# Patient Record
Sex: Female | Born: 1970
Health system: Southern US, Community
[De-identification: ages and names within clinical notes are randomized; demographics above are authoritative.]

## PROBLEM LIST (undated history)

## (undated) DIAGNOSIS — R7303 Prediabetes: Secondary | ICD-10-CM

## (undated) DIAGNOSIS — L0390 Cellulitis, unspecified: Secondary | ICD-10-CM

## (undated) DIAGNOSIS — E7800 Pure hypercholesterolemia, unspecified: Secondary | ICD-10-CM

## (undated) DIAGNOSIS — E119 Type 2 diabetes mellitus without complications: Secondary | ICD-10-CM

## (undated) DIAGNOSIS — L309 Dermatitis, unspecified: Secondary | ICD-10-CM

## (undated) DIAGNOSIS — E559 Vitamin D deficiency, unspecified: Secondary | ICD-10-CM

## (undated) DIAGNOSIS — I10 Essential (primary) hypertension: Secondary | ICD-10-CM

## (undated) DIAGNOSIS — K8020 Calculus of gallbladder without cholecystitis without obstruction: Secondary | ICD-10-CM

## (undated) DIAGNOSIS — E669 Obesity, unspecified: Secondary | ICD-10-CM

## (undated) HISTORY — PX: BREAST SURGERY: SHX581

## (undated) HISTORY — PX: TUBAL LIGATION: SHX77

---

## 2001-05-01 ENCOUNTER — Emergency Department (HOSPITAL_COMMUNITY): Admission: EM | Admit: 2001-05-01 | Discharge: 2001-05-01 | Payer: Self-pay | Admitting: Internal Medicine

## 2001-05-02 ENCOUNTER — Ambulatory Visit (HOSPITAL_COMMUNITY): Admission: RE | Admit: 2001-05-02 | Discharge: 2001-05-02 | Payer: Self-pay | Admitting: Internal Medicine

## 2001-05-02 ENCOUNTER — Encounter: Payer: Self-pay | Admitting: Internal Medicine

## 2003-08-11 ENCOUNTER — Emergency Department (HOSPITAL_COMMUNITY): Admission: EM | Admit: 2003-08-11 | Discharge: 2003-08-11 | Payer: Self-pay | Admitting: Emergency Medicine

## 2005-10-24 ENCOUNTER — Emergency Department (HOSPITAL_COMMUNITY): Admission: EM | Admit: 2005-10-24 | Discharge: 2005-10-24 | Payer: Self-pay | Admitting: Emergency Medicine

## 2006-09-25 ENCOUNTER — Emergency Department (HOSPITAL_COMMUNITY): Admission: EM | Admit: 2006-09-25 | Discharge: 2006-09-25 | Payer: Self-pay | Admitting: Emergency Medicine

## 2007-06-26 ENCOUNTER — Emergency Department (HOSPITAL_COMMUNITY): Admission: EM | Admit: 2007-06-26 | Discharge: 2007-06-26 | Payer: Self-pay | Admitting: Emergency Medicine

## 2009-04-08 ENCOUNTER — Ambulatory Visit (HOSPITAL_COMMUNITY): Admission: RE | Admit: 2009-04-08 | Discharge: 2009-04-08 | Payer: Self-pay | Admitting: Preventative Medicine

## 2009-04-08 ENCOUNTER — Encounter: Payer: Self-pay | Admitting: Orthopedic Surgery

## 2009-04-25 ENCOUNTER — Ambulatory Visit: Payer: Self-pay | Admitting: Orthopedic Surgery

## 2009-04-25 DIAGNOSIS — M1710 Unilateral primary osteoarthritis, unspecified knee: Secondary | ICD-10-CM | POA: Insufficient documentation

## 2009-04-25 DIAGNOSIS — M1711 Unilateral primary osteoarthritis, right knee: Secondary | ICD-10-CM | POA: Insufficient documentation

## 2009-12-16 ENCOUNTER — Emergency Department (HOSPITAL_COMMUNITY): Admission: EM | Admit: 2009-12-16 | Discharge: 2009-12-16 | Payer: Self-pay | Admitting: Emergency Medicine

## 2010-09-02 ENCOUNTER — Other Ambulatory Visit (HOSPITAL_COMMUNITY): Payer: Self-pay | Admitting: Internal Medicine

## 2010-09-02 DIAGNOSIS — Z139 Encounter for screening, unspecified: Secondary | ICD-10-CM

## 2010-09-07 LAB — URINALYSIS, ROUTINE W REFLEX MICROSCOPIC
Ketones, ur: NEGATIVE mg/dL
Nitrite: NEGATIVE
Protein, ur: NEGATIVE mg/dL
Urobilinogen, UA: 0.2 mg/dL (ref 0.0–1.0)
pH: 6 (ref 5.0–8.0)

## 2010-09-07 LAB — URINE CULTURE: Colony Count: 35000

## 2010-09-09 ENCOUNTER — Ambulatory Visit (HOSPITAL_COMMUNITY)
Admission: RE | Admit: 2010-09-09 | Discharge: 2010-09-09 | Disposition: A | Discharge status: Home / Self Care | Payer: BC Managed Care – PPO | Source: Ambulatory Visit | Attending: Internal Medicine | Admitting: Internal Medicine

## 2010-09-09 DIAGNOSIS — Z139 Encounter for screening, unspecified: Secondary | ICD-10-CM

## 2010-09-09 DIAGNOSIS — Z1231 Encounter for screening mammogram for malignant neoplasm of breast: Secondary | ICD-10-CM | POA: Insufficient documentation

## 2011-11-09 ENCOUNTER — Emergency Department (HOSPITAL_COMMUNITY): Payer: BC Managed Care – PPO

## 2011-11-09 ENCOUNTER — Encounter (HOSPITAL_COMMUNITY): Payer: Self-pay | Admitting: *Deleted

## 2011-11-09 ENCOUNTER — Inpatient Hospital Stay (HOSPITAL_COMMUNITY)
Admission: EM | Admit: 2011-11-09 | Discharge: 2011-11-12 | DRG: 277 | Disposition: A | Discharge status: Home / Self Care | Payer: BC Managed Care – PPO | Attending: Internal Medicine | Admitting: Internal Medicine

## 2011-11-09 DIAGNOSIS — E669 Obesity, unspecified: Secondary | ICD-10-CM | POA: Diagnosis present

## 2011-11-09 DIAGNOSIS — L03039 Cellulitis of unspecified toe: Secondary | ICD-10-CM

## 2011-11-09 DIAGNOSIS — L02619 Cutaneous abscess of unspecified foot: Secondary | ICD-10-CM

## 2011-11-09 DIAGNOSIS — R509 Fever, unspecified: Secondary | ICD-10-CM | POA: Diagnosis present

## 2011-11-09 DIAGNOSIS — Z6841 Body Mass Index (BMI) 40.0 and over, adult: Secondary | ICD-10-CM

## 2011-11-09 DIAGNOSIS — R21 Rash and other nonspecific skin eruption: Secondary | ICD-10-CM

## 2011-11-09 DIAGNOSIS — D72829 Elevated white blood cell count, unspecified: Secondary | ICD-10-CM | POA: Diagnosis present

## 2011-11-09 DIAGNOSIS — E876 Hypokalemia: Secondary | ICD-10-CM | POA: Diagnosis present

## 2011-11-09 DIAGNOSIS — L03119 Cellulitis of unspecified part of limb: Secondary | ICD-10-CM

## 2011-11-09 DIAGNOSIS — L02419 Cutaneous abscess of limb, unspecified: Principal | ICD-10-CM | POA: Diagnosis present

## 2011-11-09 LAB — CBC
HCT: 39.3 % (ref 36.0–46.0)
Hemoglobin: 13 g/dL (ref 12.0–15.0)
MCHC: 33.1 g/dL (ref 30.0–36.0)
RBC: 4.65 MIL/uL (ref 3.87–5.11)

## 2011-11-09 LAB — BASIC METABOLIC PANEL
BUN: 9 mg/dL (ref 6–23)
Chloride: 101 mEq/L (ref 96–112)
GFR calc Af Amer: 90 mL/min — ABNORMAL LOW (ref >90)
Glucose, Bld: 123 mg/dL — ABNORMAL HIGH (ref 70–99)
Potassium: 3.2 mEq/L — ABNORMAL LOW (ref 3.5–5.1)

## 2011-11-09 LAB — URINE MICROSCOPIC-ADD ON

## 2011-11-09 LAB — URINALYSIS, ROUTINE W REFLEX MICROSCOPIC
Leukocytes, UA: NEGATIVE
Protein, ur: 30 mg/dL — AB
Urobilinogen, UA: 0.2 mg/dL (ref 0.0–1.0)

## 2011-11-09 LAB — DIFFERENTIAL
Basophils Relative: 0 % (ref 0–1)
Lymphs Abs: 1.1 10*3/uL (ref 0.7–4.0)
Monocytes Absolute: 1 10*3/uL (ref 0.1–1.0)
Monocytes Relative: 4 % (ref 3–12)
Neutro Abs: 22.9 10*3/uL — ABNORMAL HIGH (ref 1.7–7.7)

## 2011-11-09 LAB — PROCALCITONIN: Procalcitonin: 1.9 ng/mL

## 2011-11-09 MED ORDER — SODIUM CHLORIDE 0.9 % IV SOLN
INTRAVENOUS | Status: DC
  Administered 2011-11-09 – 2011-11-11 (×5): via INTRAVENOUS

## 2011-11-09 MED ORDER — SODIUM CHLORIDE 0.9 % IV SOLN
1250.0000 mg | Freq: Two times a day (BID) | INTRAVENOUS | Status: DC
  Administered 2011-11-10 – 2011-11-11 (×2): 1250 mg via INTRAVENOUS
  Filled 2011-11-09 (×3): qty 1250

## 2011-11-09 MED ORDER — HYDROMORPHONE HCL PF 1 MG/ML IJ SOLN
1.0000 mg | Freq: Once | INTRAMUSCULAR | Status: AC
  Administered 2011-11-09: 1 mg via INTRAVENOUS
  Filled 2011-11-09: qty 1

## 2011-11-09 MED ORDER — ACETAMINOPHEN 325 MG PO TABS: 650.0000 mg | ORAL_TABLET | Freq: Four times a day (QID) | ORAL | Status: DC | PRN

## 2011-11-09 MED ORDER — OXYCODONE HCL 5 MG PO TABS
5.0000 mg | ORAL_TABLET | ORAL | Status: DC | PRN
  Administered 2011-11-10 – 2011-11-11 (×6): 5 mg via ORAL
  Filled 2011-11-09 (×7): qty 1

## 2011-11-09 MED ORDER — VANCOMYCIN HCL IN DEXTROSE 1-5 GM/200ML-% IV SOLN: 1000.0000 mg | Freq: Once | INTRAVENOUS | Status: DC

## 2011-11-09 MED ORDER — ONDANSETRON HCL 4 MG/2ML IJ SOLN
4.0000 mg | Freq: Four times a day (QID) | INTRAMUSCULAR | Status: DC | PRN
  Administered 2011-11-12: 4 mg via INTRAVENOUS
  Filled 2011-11-09: qty 2

## 2011-11-09 MED ORDER — PIPERACILLIN-TAZOBACTAM 3.375 G IVPB
3.3750 g | Freq: Three times a day (TID) | INTRAVENOUS | Status: DC
  Administered 2011-11-09 – 2011-11-12 (×8): 3.375 g via INTRAVENOUS
  Filled 2011-11-09 (×12): qty 50

## 2011-11-09 MED ORDER — ONDANSETRON HCL 4 MG PO TABS: 4.0000 mg | ORAL_TABLET | Freq: Four times a day (QID) | ORAL | Status: DC | PRN

## 2011-11-09 MED ORDER — ACETAMINOPHEN 650 MG RE SUPP
975.0000 mg | Freq: Once | RECTAL | Status: AC
  Administered 2011-11-09: 975 mg via RECTAL
  Filled 2011-11-09: qty 1

## 2011-11-09 MED ORDER — ONDANSETRON HCL 4 MG/2ML IJ SOLN
4.0000 mg | Freq: Once | INTRAMUSCULAR | Status: AC
  Administered 2011-11-09: 4 mg via INTRAVENOUS
  Filled 2011-11-09: qty 2

## 2011-11-09 MED ORDER — HEPARIN SODIUM (PORCINE) 5000 UNIT/ML IJ SOLN
5000.0000 [IU] | Freq: Three times a day (TID) | INTRAMUSCULAR | Status: DC
  Administered 2011-11-09 – 2011-11-12 (×8): 5000 [IU] via SUBCUTANEOUS
  Filled 2011-11-09 (×14): qty 1

## 2011-11-09 MED ORDER — PIPERACILLIN-TAZOBACTAM 3.375 G IVPB
INTRAVENOUS | Status: AC
  Filled 2011-11-09: qty 100

## 2011-11-09 MED ORDER — ACETAMINOPHEN 500 MG PO TABS
1000.0000 mg | ORAL_TABLET | Freq: Once | ORAL | Status: DC
  Filled 2011-11-09: qty 2

## 2011-11-09 MED ORDER — VANCOMYCIN HCL IN DEXTROSE 1-5 GM/200ML-% IV SOLN
1000.0000 mg | Freq: Once | INTRAVENOUS | Status: AC
  Administered 2011-11-09: 1000 mg via INTRAVENOUS
  Filled 2011-11-09: qty 200

## 2011-11-09 MED ORDER — SODIUM CHLORIDE 0.9 % IV BOLUS (SEPSIS)
500.0000 mL | Freq: Once | INTRAVENOUS | Status: AC
  Administered 2011-11-09: 500 mL via INTRAVENOUS

## 2011-11-09 MED ORDER — ACETAMINOPHEN 650 MG RE SUPP
650.0000 mg | Freq: Four times a day (QID) | RECTAL | Status: DC | PRN
  Administered 2011-11-09 – 2011-11-10 (×3): 650 mg via RECTAL
  Filled 2011-11-09 (×3): qty 1

## 2011-11-09 MED ORDER — PIPERACILLIN-TAZOBACTAM 3.375 G IVPB 30 MIN
3.3750 g | Freq: Once | INTRAVENOUS | Status: AC
  Administered 2011-11-09: 3.375 g via INTRAVENOUS
  Filled 2011-11-09: qty 50

## 2011-11-09 NOTE — Progress Notes (Signed)
ANTIBIOTIC CONSULT NOTE - INITIAL  Pharmacy Consult for Vancomycin, Zosyn Indication: cellulits  No Known Allergies  Patient Measurements: Height: 5' 9.5" (176.5 cm) Weight: 300 lb (136.079 kg) IBW/kg (Calculated) : 67.35   Vital Signs: Temp: 103.2 F (39.6 C) (05/20 1637) Temp src: Oral (05/20 1637) BP: 110/65 mmHg (05/20 1458) Pulse Rate: 115  (05/20 1637) Intake/Output from previous day:   Intake/Output from this shift:    Labs:  Basename 11/09/11 1514  WBC 25.0*  HGB 13.0  PLT 327  LABCREA --  CREATININE 0.91   Estimated Creatinine Clearance: 121.9 ml/min (by C-G formula based on Cr of 0.91). No results found for this basename: VANCOTROUGH:2,VANCOPEAK:2,VANCORANDOM:2,GENTTROUGH:2,GENTPEAK:2,GENTRANDOM:2,TOBRATROUGH:2,TOBRAPEAK:2,TOBRARND:2,AMIKACINPEAK:2,AMIKACINTROU:2,AMIKACIN:2, in the last 72 hours   Microbiology: No results found for this or any previous visit (from the past 720 hour(s)).  Medical History: History reviewed. No pertinent past medical history.  Medications:  Scheduled:    . acetaminophen  975 mg Rectal Once  . acetaminophen  1,000 mg Oral Once  . heparin  5,000 Units Subcutaneous Q8H  .  HYDROmorphone (DILAUDID) injection  1 mg Intravenous Once  . ondansetron (ZOFRAN) IV  4 mg Intravenous Once  . piperacillin-tazobactam  3.375 g Intravenous Once  . sodium chloride  500 mL Intravenous Once  . vancomycin  1,250 mg Intravenous Q12H   Assessment: Pt admitted with RLL cellulitis to start on IV antibiotics. She is febrile with elevated WBC.  Normal renal function.  She has received 1gm Vancomycin and Zosyn 3.375gm in ED.   Goal of Therapy:  Vancomycin trough level 10-15 mcg/ml  Plan:  1) Given additional Vancomycin 1gm IV bolus x 1 now for total 2gm load dose then 1250mg  IV Q12h 2) Zosyn 3.375gm IV Q8h 3) Check vancomycin trough level at steady state 4) Monitor renal function and cx data  Elson Clan 11/09/2011,5:24  PM

## 2011-11-09 NOTE — H&P (Signed)
Carrie Mitchell MRN: [id number] DOB/AGE: 42-Aug-1972 42 y.o.  Admit date: 11/09/2011 Chief Complaint: Right leg pain, fever. HPI: This 41 year old lady presents with the above symptoms for the last few days. She cannot specify exactly when it started but in the emergency room she appears to be very uncomfortable. She was documented to have a fever. She was also documented to have cellulitis in the right lower leg. Venous Doppler did not reveal DVT. The source of entry is probably is the right foot where there is dry skin with cracking. She is not otherwise hemodynamically unstable. She has a significant leukocytosis.  History reviewed. No pertinent past medical history. Past Surgical History  Procedure Date  . Tubal ligation           Social History:  She is married and lives with her husband and 4 children. She does not smoke and does not drink alcohol. 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  Allergies: No Known Allergies       WUJ:WJXBJ from the symptoms mentioned above,there are no other symptoms referable to all systems reviewed.  Physical Exam: Blood pressure 110/65, pulse 115, temperature 103.2 F (39.6 C), temperature source Oral, resp. rate 22, height 5' 9.5" (1.765 m), weight 136.079 kg (300 lb), SpO2 99.00%. She is uncomfortable at rest. She is febrile. She does not however looked to be septic/toxic. Heart sounds are present and normal with a resting tachycardia. Lung fields are clear. She is alert and orientated. Her abdomen is soft and nontender. There is cellulitis in her right leg extending from the mid lower leg down towards the ankle. It does not involve the foot.    Basename 11/09/11 1514  WBC 25.0*  NEUTROABS 22.9*  HGB 13.0  HCT 39.3  MCV 84.5  PLT 327    Basename 11/09/11 1514  NA 136  K 3.2*  CL 101  CO2 24  GLUCOSE 123*  BUN 9  CREATININE 0.91  CALCIUM 9.6  MG --         US Venous Img Lower Unilateral Right  11/09/2011   *RADIOLOGY REPORT*  Clinical Data: Pain and swelling.  RIGHT LOWER EXTREMITY VENOUS DUPLEX ULTRASOUND  Technique:  Gray-scale sonography with graded compression, as well as color Doppler and duplex ultrasound, were performed to evaluate the deep venous system of the lower extremity from the level of the common femoral vein through the popliteal and proximal calf veins. Spectral Doppler was utilized to evaluate flow at rest and with distal augmentation maneuvers.  Comparison:  None.  Findings: There is complete compressibility of the right common femoral, femoral, popliteal veins.  Doppler analysis demonstrates respiratory phasicity and augmentation of flow upon calf compression.  IMPRESSION: No evidence of right lower extremity DVT.  Original Report Authenticated By: Donavan Burnet, M.D.   Impression: 1. Right lower leg cellulitis. 2. Hypokalemia. 3. Obesity.     Plan: 1. Admit to the medical floor. 2. Intravenous antibiotics.  3. Analgesia as required. 4. IV fluids. Further recommendations will depend on patient's hospital progress.      Wilson Singer Pager [phone]  11/09/2011, 5:00 PM

## 2011-11-09 NOTE — ED Provider Notes (Signed)
History   This chart was scribed for Flint Melter, MD by Clarita Crane. The patient was seen in room APA15/APA15. Patient's care was started at 1211.    CSN: [id number]  Arrival date & time 11/09/11  1211   First MD Initiated Contact with Patient 11/09/11 1456      Chief Complaint  Patient presents with  . Back Pain    (Consider location/radiation/quality/duration/timing/severity/associated sxs/prior treatment) HPI Carrie Mitchell is a 41 y.o. female who presents to the Emergency Department accompanied by family member who reports patient c/o constant severe pain to bilateral upper and lower extremities which began 12 hours ago and has been persistent since with associated fever, chills, back pain, HA and numbness to bilateral upper extremities. Patient's family member also states that she noted that the right side of the patient's face appeared swollen this morning and that she also observed a red "band" around the distal aspect of her right lower leg which was not observed yesterday. Denies SOB, chest pain, nausea, vomiting. Patient with h/o tubal ligation. LMP- 1 year ago.  History reviewed. No pertinent past medical history.  Past Surgical History  Procedure Date  . Tubal ligation     History reviewed. No pertinent family history.  History  Substance Use Topics  . Smoking status: Never Smoker   . Smokeless tobacco: Not on file  . Alcohol Use: No    OB History    Grav Para Term Preterm Abortions TAB SAB Ect Mult Living                  Review of Systems A complete 10 system review of systems was obtained and all systems are negative except as noted in the HPI and PMH.   Allergies  Review of patient's allergies indicates no known allergies.  Home Medications   Current Outpatient Rx  Name Route Sig Dispense Refill  . ASPIRIN-CAFFEINE 845-65 MG PO PACK Oral Take 1 Package by mouth every 8 (eight) hours as needed.      BP 110/65  Pulse 115  Temp(Src) 103.2  F (39.6 C) (Oral)  Resp 22  Ht 5' 9.5" (1.765 m)  Wt 300 lb (136.079 kg)  BMI 43.67 kg/m2  SpO2 99%  Physical Exam  Nursing note and vitals reviewed. Constitutional: She is oriented to person, place, and time. She appears well-developed and well-nourished. No distress.       Uncomfortable appearing.   HENT:  Head: Normocephalic and atraumatic.       Mucous membranes moist.   Eyes: EOM are normal. Pupils are equal, round, and reactive to light.  Neck: Neck supple. No tracheal deviation present.  Cardiovascular: Regular rhythm.  Tachycardia present.  Exam reveals no gallop and no friction rub.   No murmur heard. Pulmonary/Chest: Effort normal. No respiratory distress. She has no wheezes. She has no rales.  Abdominal: Soft. Bowel sounds are normal. She exhibits no distension.  Musculoskeletal: Normal range of motion. She exhibits no edema.       Mild diffuse tenderness to back. Thickened skin and hyperpigmentation to bilateral feet. Right calf swollen and tender to palpation. Swelling to anterior right lower leg with an area of redness. Increased induration to posterior aspect of right ankle.   Neurological: She is alert and oriented to person, place, and time. No sensory deficit.  Skin: Skin is warm and dry.  Psychiatric: She has a normal mood and affect. Her behavior is normal.    ED Course  Procedures (including  critical care time)  DIAGNOSTIC STUDIES: Oxygen Saturation is 98% on room air, normal by my interpretation.    COORDINATION OF CARE: 3:15PM-Patient informed of current plan for treatment and evaluation and agrees with plan at this time.   Emergency department treatment: IV fluid bolus and drip, Zosyn, IV, vancomycin, IV, reevaluation and assessment..  16:27-  repeat vitals ordered.    Labs Reviewed  URINALYSIS, ROUTINE W REFLEX MICROSCOPIC - Abnormal; Notable for the following:    pH 8.5 (*)    Protein, ur 30 (*)    All other components within normal limits    BASIC METABOLIC PANEL - Abnormal; Notable for the following:    Potassium 3.2 (*)    Glucose, Bld 123 (*)    GFR calc non Af Amer 77 (*)    GFR calc Af Amer 90 (*)    All other components within normal limits  CBC - Abnormal; Notable for the following:    WBC 25.0 (*)    All other components within normal limits  DIFFERENTIAL - Abnormal; Notable for the following:    Neutrophils Relative 92 (*)    Neutro Abs 22.9 (*)    Lymphocytes Relative 4 (*)    All other components within normal limits  URINE MICROSCOPIC-ADD ON - Abnormal; Notable for the following:    Squamous Epithelial / LPF MANY (*)    Bacteria, UA FEW (*)    Casts HYALINE CASTS (*)    All other components within normal limits  LACTIC ACID, PLASMA  PROCALCITONIN  URINE CULTURE  CULTURE, BLOOD (ROUTINE X 2)  CULTURE, BLOOD (ROUTINE X 2)   US Venous Img Lower Unilateral Right  11/09/2011  *RADIOLOGY REPORT*  Clinical Data: Pain and swelling.  RIGHT LOWER EXTREMITY VENOUS DUPLEX ULTRASOUND  Technique:  Gray-scale sonography with graded compression, as well as color Doppler and duplex ultrasound, were performed to evaluate the deep venous system of the lower extremity from the level of the common femoral vein through the popliteal and proximal calf veins. Spectral Doppler was utilized to evaluate flow at rest and with distal augmentation maneuvers.  Comparison:  None.  Findings: There is complete compressibility of the right common femoral, femoral, popliteal veins.  Doppler analysis demonstrates respiratory phasicity and augmentation of flow upon calf compression.  IMPRESSION: No evidence of right lower extremity DVT.  Original Report Authenticated By: Donavan Burnet, M.D.     1. Cellulitis of lower leg   2. Rash       MDM  Evaluation consistent with right lower leg cellulitis, likely caused from foot rash. Doubt severe sepsis, metabolic instability or impending vascular collapse. She will need treatment in hospital for  stabilization.    16:48- Admission arranged    I personally performed the services described in this documentation, which was scribed in my presence. The recorded information has been reviewed and considered.    Flint Melter, MD 11/09/11 [phone]

## 2011-11-09 NOTE — Progress Notes (Signed)
Dr. Karilyn Cota returned page at [facility] regarding changing Tylenol from PO to rectal. Order received. Will place in chart.

## 2011-11-09 NOTE — ED Notes (Signed)
Pt refused PO tylenol, states she cannot swallow pills.  Offered tylenol PR, pt accepted.

## 2011-11-09 NOTE — ED Notes (Signed)
Attempted to give report. Nurse unable to take report at this time. 

## 2011-11-09 NOTE — Progress Notes (Signed)
Pt's temperature 102.9 orally at this time. Pt states she can not swallow pills. Tylenol 650 mg PO ordered at this time. Dr. Karilyn Cota paged at [phone] regarding changing Tylenol from PO to Rectal. Will continue to monitor.

## 2011-11-09 NOTE — ED Notes (Signed)
Pharm called regarding heparin. States pt can wait until she gets on the floor

## 2011-11-09 NOTE — ED Notes (Signed)
Low back pain since 3 am,  Nausea, feels "cold".  No vomiting or diarrhea. Incontinence of urine this am  "I can't hold it".  Moaning at triage.

## 2011-11-10 DIAGNOSIS — L02619 Cutaneous abscess of unspecified foot: Secondary | ICD-10-CM

## 2011-11-10 DIAGNOSIS — L03039 Cellulitis of unspecified toe: Secondary | ICD-10-CM

## 2011-11-10 LAB — COMPREHENSIVE METABOLIC PANEL
ALT: 15 U/L (ref 0–35)
AST: 23 U/L (ref 0–37)
Alkaline Phosphatase: 85 U/L (ref 39–117)
CO2: 23 mEq/L (ref 19–32)
Chloride: 101 mEq/L (ref 96–112)
Creatinine, Ser: 1.33 mg/dL — ABNORMAL HIGH (ref 0.50–1.10)
GFR calc non Af Amer: 49 mL/min — ABNORMAL LOW (ref >90)
Potassium: 3.3 mEq/L — ABNORMAL LOW (ref 3.5–5.1)
Total Bilirubin: 0.8 mg/dL (ref 0.3–1.2)

## 2011-11-10 LAB — CBC
MCH: 27.6 pg (ref 26.0–34.0)
MCHC: 32.6 g/dL (ref 30.0–36.0)
MCV: 84.7 fL (ref 78.0–100.0)
Platelets: 279 10*3/uL (ref 150–400)
RDW: 15.3 % (ref 11.5–15.5)

## 2011-11-10 LAB — URINE CULTURE

## 2011-11-10 MED ORDER — POTASSIUM CHLORIDE CRYS ER 20 MEQ PO TBCR
40.0000 meq | EXTENDED_RELEASE_TABLET | ORAL | Status: AC
  Administered 2011-11-10 (×2): 40 meq via ORAL
  Filled 2011-11-10 (×2): qty 2

## 2011-11-10 NOTE — Progress Notes (Signed)
Subjective: She continues to have swelling, erythema, pain in her right lower extremity. Has great difficulty walking on it.  Objective: Vital signs in last 24 hours: Temp:  [99.2 F (37.3 C)-103.9 F (39.9 C)] 100.6 F (38.1 C) (05/21 1358) Pulse Rate:  [90-115] 97  (05/21 1358) Resp:  [18-22] 20  (05/21 1358) BP: (90-126)/(44-78) 108/69 mmHg (05/21 1358) SpO2:  [95 %-99 %] 95 % (05/21 1358) Weight:  [143.8 kg (317 lb 0.3 oz)] 143.8 kg (317 lb 0.3 oz) (05/20 1841) Weight change:  Last BM Date: 11/07/11  Intake/Output from previous day: 05/20 0701 - 05/21 0700 In: 1972.9 [I.V.:1672.9; IV Piggyback:300] Out: -  Total I/O In: 120 [P.O.:120] Out: -    Physical Exam: General: Alert, awake, oriented x3, in no acute distress. HEENT: No bruits, no goiter. Heart: Regular rate and rhythm, without murmurs, rubs, gallops. Lungs: Clear to auscultation bilaterally. Abdomen: Soft, nontender, nondistended, positive bowel sounds. Extremities: Right lower extremity is erythematous, warm, tender. Neuro: Grossly intact, nonfocal.    Lab Results: Basic Metabolic Panel:  Basename 11/10/11 0506 11/09/11 1514  NA 136 136  K 3.3* 3.2*  CL 101 101  CO2 23 24  GLUCOSE 110* 123*  BUN 13 9  CREATININE 1.33* 0.91  CALCIUM 8.5 9.6  MG -- --  PHOS -- --   Liver Function Tests:  Basename 11/10/11 0506  AST 23  ALT 15  ALKPHOS 85  BILITOT 0.8  PROT 6.9  ALBUMIN 2.8*   No results found for this basename: LIPASE:2,AMYLASE:2 in the last 72 hours No results found for this basename: AMMONIA:2 in the last 72 hours CBC:  Basename 11/10/11 0506 11/09/11 1514  WBC 17.1* 25.0*  NEUTROABS -- 22.9*  HGB 11.9* 13.0  HCT 36.5 39.3  MCV 84.7 84.5  PLT 279 327   Cardiac Enzymes: No results found for this basename: CKTOTAL:3,CKMB:3,CKMBINDEX:3,TROPONINI:3 in the last 72 hours BNP: No results found for this basename: PROBNP:3 in the last 72 hours D-Dimer: No results found for this  basename: DDIMER:2 in the last 72 hours CBG: No results found for this basename: GLUCAP:6 in the last 72 hours Hemoglobin A1C: No results found for this basename: HGBA1C in the last 72 hours Fasting Lipid Panel: No results found for this basename: CHOL,HDL,LDLCALC,TRIG,CHOLHDL,LDLDIRECT in the last 72 hours Thyroid Function Tests: No results found for this basename: TSH,T4TOTAL,FREET4,T3FREE,THYROIDAB in the last 72 hours Anemia Panel: No results found for this basename: VITAMINB12,FOLATE,FERRITIN,TIBC,IRON,RETICCTPCT in the last 72 hours Coagulation: No results found for this basename: LABPROT:2,INR:2 in the last 72 hours Urine Drug Screen: Drugs of Abuse  No results found for this basename: labopia, cocainscrnur, labbenz, amphetmu, thcu, labbarb    Alcohol Level: No results found for this basename: ETH:2 in the last 72 hours Urinalysis:  Basename 11/09/11 1506  COLORURINE YELLOW  LABSPEC 1.015  PHURINE 8.5*  GLUCOSEU NEGATIVE  HGBUR NEGATIVE  BILIRUBINUR NEGATIVE  KETONESUR NEGATIVE  PROTEINUR 30*  UROBILINOGEN 0.2  NITRITE NEGATIVE  LEUKOCYTESUR NEGATIVE    Recent Results (from the past 240 hour(s))  CULTURE, BLOOD (ROUTINE X 2)     Status: Normal (Preliminary result)   Collection Time   11/09/11  3:30 PM      Component Value Range Status Comment   Specimen Description BLOOD RIGHT FOREARM   Final    Special Requests BOTTLES DRAWN AEROBIC AND ANAEROBIC 5CC   Final    Culture NO GROWTH 1 DAY   Final    Report Status PENDING   Incomplete  CULTURE, BLOOD (ROUTINE X 2)     Status: Normal (Preliminary result)   Collection Time   11/09/11  3:35 PM      Component Value Range Status Comment   Specimen Description BLOOD LEFT FOREARM   Final    Special Requests BOTTLES DRAWN AEROBIC AND ANAEROBIC 4CC   Final    Culture NO GROWTH 1 DAY   Final    Report Status PENDING   Incomplete     Studies/Results: US Venous Img Lower Unilateral Right  11/09/2011  *RADIOLOGY REPORT*   Clinical Data: Pain and swelling.  RIGHT LOWER EXTREMITY VENOUS DUPLEX ULTRASOUND  Technique:  Gray-scale sonography with graded compression, as well as color Doppler and duplex ultrasound, were performed to evaluate the deep venous system of the lower extremity from the level of the common femoral vein through the popliteal and proximal calf veins. Spectral Doppler was utilized to evaluate flow at rest and with distal augmentation maneuvers.  Comparison:  None.  Findings: There is complete compressibility of the right common femoral, femoral, popliteal veins.  Doppler analysis demonstrates respiratory phasicity and augmentation of flow upon calf compression.  IMPRESSION: No evidence of right lower extremity DVT.  Original Report Authenticated By: Donavan Burnet, M.D.    Medications: Scheduled Meds:   . acetaminophen  975 mg Rectal Once  . acetaminophen  1,000 mg Oral Once  . heparin  5,000 Units Subcutaneous Q8H  .  HYDROmorphone (DILAUDID) injection  1 mg Intravenous Once  . ondansetron (ZOFRAN) IV  4 mg Intravenous Once  . piperacillin-tazobactam  3.375 g Intravenous Once  . piperacillin-tazobactam (ZOSYN)  IV  3.375 g Intravenous Q8H  . sodium chloride  500 mL Intravenous Once  . vancomycin  1,250 mg Intravenous Q12H  . vancomycin  1,000 mg Intravenous Once  . vancomycin  1,000 mg Intravenous Once   Continuous Infusions:   . sodium chloride 125 mL/hr at 11/10/11 1437   PRN Meds:.acetaminophen, ondansetron (ZOFRAN) IV, ondansetron, oxyCODONE, DISCONTD: acetaminophen  Assessment/Plan:  Principal Problem:  *Cellulitis and abscess of leg, except foot Active Problems:  Obesity  Plan:  Patient be continued on IV antibiotics. She still having low-grade fevers. Her leukocytosis has mildly improved. She is on broad-spectrum antibiotics of vancomycin and Zosyn. Once she further improves, we can transition her to oral antibiotics. Anticipate discharge home in the next 1- 2 days.   LOS: 1  day   Malli Falotico [facility] Pager: [phone] 11/10/2011, 3:00 PM

## 2011-11-10 NOTE — Care Management Note (Signed)
    Page 1 of 1   11/12/2011     3:43:28 PM   CARE MANAGEMENT NOTE 11/12/2011  Patient:  Carrie Mitchell, Carrie Mitchell   Account Number:  [id number]  Date Initiated:  11/10/2011  Documentation initiated by:  Rosemary Holms  Subjective/Objective Assessment:   Pt states she lives at home with spouse. She has cellulitis of the R. lower leg. PTA was independent and working.     Action/Plan:   Pt states her PCP is [place]. Will continue to assess for dc needs.   Anticipated DC Date:  11/12/2011   Anticipated DC Plan:  HOME/SELF CARE      DC Planning Services  CM consult      Choice offered to / List presented to:             Status of service:  Completed, signed off Medicare Important Message given?   (If response is "NO", the following Medicare IM given date fields will be blank) Date Medicare IM given:   Date Additional Medicare IM given:    Discharge Disposition:  HOME/SELF CARE  Per UR Regulation:    If discussed at Long Length of Stay Meetings, dates discussed:    Comments:  11/10/11 830 Develle Sievers RN  BSN CM

## 2011-11-10 NOTE — Progress Notes (Signed)
ANTIBIOTIC CONSULT NOTE   Pharmacy Consult for Vancomycin, Zosyn Indication: cellulits  No Known Allergies  Patient Measurements: Height: 5\' 11"  (180.3 cm) Weight: 317 lb 0.3 oz (143.8 kg) IBW/kg (Calculated) : 70.8   Vital Signs: Temp: 99.9 F (37.7 C) (05/21 0536) Temp src: Oral (05/21 0206) BP: 102/69 mmHg (05/21 0536) Pulse Rate: 90  (05/21 0536) Intake/Output from previous day: 05/20 0701 - 05/21 0700 In: 1972.9 [I.V.:1672.9; IV Piggyback:300] Out: -  Intake/Output from this shift:    Labs:  [facility] 11/10/11 0506 11/09/11 1514  WBC 17.1* 25.0*  HGB 11.9* 13.0  PLT 279 327  LABCREA -- --  CREATININE 1.33* 0.91   Estimated Creatinine Clearance: 87.9 ml/min (by C-G formula based on Cr of 1.33). No results found for this basename: VANCOTROUGH:2,VANCOPEAK:2,VANCORANDOM:2,GENTTROUGH:2,GENTPEAK:2,GENTRANDOM:2,TOBRATROUGH:2,TOBRAPEAK:2,TOBRARND:2,AMIKACINPEAK:2,AMIKACINTROU:2,AMIKACIN:2, in the last 72 hours   Microbiology: No results found for this or any previous visit (from the past 720 hour(s)).  Medical History: History reviewed. No pertinent past medical history.  Medications:  Scheduled:     . acetaminophen  975 mg Rectal Once  . acetaminophen  1,000 mg Oral Once  . heparin  5,000 Units Subcutaneous Q8H  .  HYDROmorphone (DILAUDID) injection  1 mg Intravenous Once  . ondansetron (ZOFRAN) IV  4 mg Intravenous Once  . piperacillin-tazobactam  3.375 g Intravenous Once  . piperacillin-tazobactam (ZOSYN)  IV  3.375 g Intravenous Q8H  . sodium chloride  500 mL Intravenous Once  . vancomycin  1,250 mg Intravenous Q12H  . vancomycin  1,000 mg Intravenous Once  . vancomycin  1,000 mg Intravenous Once   Assessment: Pt admitted with RLL cellulitis to start on IV antibiotics. She is febrile with elevated WBC.  Normal renal function.  She has received 1gm Vancomycin and Zosyn 3.375gm in ED.  SCr bumped up slightly on 5/21  Goal of Therapy:  Vancomycin trough  level 10-15 mcg/ml  Plan:  1) Vancomycin 1250mg  IV Q12h 2) Zosyn 3.375gm IV Q8h 3) Check vancomycin trough level tomorrow 4) Monitor renal function and cx data  Valrie Hart A 11/10/2011,7:41 AM

## 2011-11-10 NOTE — Progress Notes (Signed)
UR chart review completed. Carrie Mitchell  

## 2011-11-11 DIAGNOSIS — L02619 Cutaneous abscess of unspecified foot: Secondary | ICD-10-CM

## 2011-11-11 DIAGNOSIS — L03039 Cellulitis of unspecified toe: Secondary | ICD-10-CM

## 2011-11-11 LAB — CBC
MCH: 27.9 pg (ref 26.0–34.0)
MCHC: 32.4 g/dL (ref 30.0–36.0)
RDW: 15.3 % (ref 11.5–15.5)

## 2011-11-11 LAB — BASIC METABOLIC PANEL
Calcium: 8.5 mg/dL (ref 8.4–10.5)
GFR calc Af Amer: 79 mL/min — ABNORMAL LOW (ref >90)
GFR calc non Af Amer: 68 mL/min — ABNORMAL LOW (ref >90)
Potassium: 3.5 mEq/L (ref 3.5–5.1)
Sodium: 137 mEq/L (ref 135–145)

## 2011-11-11 LAB — VANCOMYCIN, RANDOM: Vancomycin Rm: <5 ug/mL

## 2011-11-11 MED ORDER — GI COCKTAIL ~~LOC~~
ORAL | Status: AC
  Filled 2011-11-11: qty 30

## 2011-11-11 MED ORDER — GI COCKTAIL ~~LOC~~
30.0000 mL | Freq: Once | ORAL | Status: AC
  Administered 2011-11-11: 30 mL via ORAL
  Filled 2011-11-11: qty 30

## 2011-11-11 MED ORDER — VANCOMYCIN HCL 1000 MG IV SOLR
1500.0000 mg | Freq: Two times a day (BID) | INTRAVENOUS | Status: DC
  Administered 2011-11-11 – 2011-11-12 (×2): 1500 mg via INTRAVENOUS
  Filled 2011-11-11 (×4): qty 1500

## 2011-11-11 NOTE — Progress Notes (Signed)
Subjective: Feels about the same as yesterday,still has pain in her leg  Objective: Vital signs in last 24 hours: Temp:  [98.5 F (36.9 C)-101.2 F (38.4 C)] 98.5 F (36.9 C) (05/22 0454) Pulse Rate:  [84-97] 84  (05/22 0454) Resp:  [16-20] 16  (05/22 0454) BP: (101-108)/(68-70) 103/70 mmHg (05/22 0454) SpO2:  [95 %-99 %] 99 % (05/22 0454) Weight change:  Last BM Date: 11/09/11  Intake/Output from previous day: 05/21 0701 - 05/22 0700 In: 600 [P.O.:600] Out: -      Physical Exam: General: Alert, awake, oriented x3, in no acute distress. HEENT: No bruits, no goiter. Heart: Regular rate and rhythm, without murmurs, rubs, gallops. Lungs: Clear to auscultation bilaterally. Abdomen: Soft, nontender, nondistended, positive bowel sounds. Extremities: erythema, tenderness and warmth still present in RLE Neuro: Grossly intact, nonfocal.    Lab Results: Basic Metabolic Panel:  Basename 11/11/11 0408 11/10/11 0506  NA 137 136  K 3.5 3.3*  CL 106 101  CO2 24 23  GLUCOSE 105* 110*  BUN 8 13  CREATININE 1.01 1.33*  CALCIUM 8.5 8.5  MG 1.8 --  PHOS -- --   Liver Function Tests:  Basename 11/10/11 0506  AST 23  ALT 15  ALKPHOS 85  BILITOT 0.8  PROT 6.9  ALBUMIN 2.8*   No results found for this basename: LIPASE:2,AMYLASE:2 in the last 72 hours No results found for this basename: AMMONIA:2 in the last 72 hours CBC:  Basename 11/11/11 0420 11/10/11 0506 11/09/11 1514  WBC 12.6* 17.1* --  NEUTROABS -- -- 22.9*  HGB 10.6* 11.9* --  HCT 32.7* 36.5 --  MCV 86.1 84.7 --  PLT 218 279 --   Cardiac Enzymes: No results found for this basename: CKTOTAL:3,CKMB:3,CKMBINDEX:3,TROPONINI:3 in the last 72 hours BNP: No results found for this basename: PROBNP:3 in the last 72 hours D-Dimer: No results found for this basename: DDIMER:2 in the last 72 hours CBG: No results found for this basename: GLUCAP:6 in the last 72 hours Hemoglobin A1C: No results found for this  basename: HGBA1C in the last 72 hours Fasting Lipid Panel: No results found for this basename: CHOL,HDL,LDLCALC,TRIG,CHOLHDL,LDLDIRECT in the last 72 hours Thyroid Function Tests: No results found for this basename: TSH,T4TOTAL,FREET4,T3FREE,THYROIDAB in the last 72 hours Anemia Panel: No results found for this basename: VITAMINB12,FOLATE,FERRITIN,TIBC,IRON,RETICCTPCT in the last 72 hours Coagulation: No results found for this basename: LABPROT:2,INR:2 in the last 72 hours Urine Drug Screen: Drugs of Abuse  No results found for this basename: labopia, cocainscrnur, labbenz, amphetmu, thcu, labbarb    Alcohol Level: No results found for this basename: ETH:2 in the last 72 hours Urinalysis:  Basename 11/09/11 1506  COLORURINE YELLOW  LABSPEC 1.015  PHURINE 8.5*  GLUCOSEU NEGATIVE  HGBUR NEGATIVE  BILIRUBINUR NEGATIVE  KETONESUR NEGATIVE  PROTEINUR 30*  UROBILINOGEN 0.2  NITRITE NEGATIVE  LEUKOCYTESUR NEGATIVE    Recent Results (from the past 240 hour(s))  URINE CULTURE     Status: Normal   Collection Time   11/09/11  3:06 PM      Component Value Range Status Comment   Specimen Description URINE, CLEAN CATCH   Final    Special Requests NONE   Final    Culture  Setup Time [id number]   Final    Colony Count 40,000 COLONIES/ML   Final    Culture     Final    Value: Multiple bacterial morphotypes present, none predominant. Suggest appropriate recollection if clinically indicated.   Report Status 11/10/2011 FINAL  Final   CULTURE, BLOOD (ROUTINE X 2)     Status: Normal (Preliminary result)   Collection Time   11/09/11  3:30 PM      Component Value Range Status Comment   Specimen Description BLOOD RIGHT FOREARM   Final    Special Requests BOTTLES DRAWN AEROBIC AND ANAEROBIC 5CC   Final    Culture NO GROWTH 2 DAYS   Final    Report Status PENDING   Incomplete   CULTURE, BLOOD (ROUTINE X 2)     Status: Normal (Preliminary result)   Collection Time   11/09/11  3:35 PM       Component Value Range Status Comment   Specimen Description BLOOD LEFT FOREARM   Final    Special Requests BOTTLES DRAWN AEROBIC AND ANAEROBIC 4CC   Final    Culture NO GROWTH 2 DAYS   Final    Report Status PENDING   Incomplete     Studies/Results: US Venous Img Lower Unilateral Right  11/09/2011  *RADIOLOGY REPORT*  Clinical Data: Pain and swelling.  RIGHT LOWER EXTREMITY VENOUS DUPLEX ULTRASOUND  Technique:  Gray-scale sonography with graded compression, as well as color Doppler and duplex ultrasound, were performed to evaluate the deep venous system of the lower extremity from the level of the common femoral vein through the popliteal and proximal calf veins. Spectral Doppler was utilized to evaluate flow at rest and with distal augmentation maneuvers.  Comparison:  None.  Findings: There is complete compressibility of the right common femoral, femoral, popliteal veins.  Doppler analysis demonstrates respiratory phasicity and augmentation of flow upon calf compression.  IMPRESSION: No evidence of right lower extremity DVT.  Original Report Authenticated By: Donavan Burnet, M.D.    Medications: Scheduled Meds:   . acetaminophen  1,000 mg Oral Once  . heparin  5,000 Units Subcutaneous Q8H  . piperacillin-tazobactam (ZOSYN)  IV  3.375 g Intravenous Q8H  . potassium chloride  40 mEq Oral Q3H  . vancomycin  1,500 mg Intravenous Q12H  . DISCONTD: vancomycin  1,250 mg Intravenous Q12H  . DISCONTD: vancomycin  1,000 mg Intravenous Once   Continuous Infusions:   . sodium chloride 125 mL/hr at 11/11/11 0835   PRN Meds:.acetaminophen, ondansetron (ZOFRAN) IV, ondansetron, oxyCODONE  Assessment/Plan:  Principal Problem:  *Cellulitis and abscess of leg, except foot Active Problems:  Obesity  Plan:  Patient did have recurrence of fever last night.  Leukocytosis is improving.  Continue IV abx for today.  If afebrile and leukocytosis continues to improve, then can likely change to po abx  tomorrow.   LOS: 2 days   Montanna Mcbain [facility] Pager: [phone] 11/11/2011, 10:53 AM

## 2011-11-11 NOTE — Progress Notes (Signed)
ANTIBIOTIC CONSULT NOTE   Pharmacy Consult for Vancomycin, Zosyn Indication: cellulits  No Known Allergies  Patient Measurements: Height: 5\' 11"  (180.3 cm) Weight: 317 lb 0.3 oz (143.8 kg) IBW/kg (Calculated) : 70.8   Vital Signs: Temp: 98.5 F (36.9 C) (05/22 0454) Temp src: Oral (05/22 0454) BP: 103/70 mmHg (05/22 0454) Pulse Rate: 84  (05/22 0454) Intake/Output from previous day: 05/21 0701 - 05/22 0700 In: 600 [P.O.:600] Out: -  Intake/Output from this shift:    Labs:  Basename 11/11/11 0420 11/11/11 0408 11/10/11 0506 11/09/11 1514  WBC 12.6* -- 17.1* 25.0*  HGB 10.6* -- 11.9* 13.0  PLT 218 -- 279 327  LABCREA -- -- -- --  CREATININE -- 1.01 1.33* 0.91   Estimated Creatinine Clearance: 115.7 ml/min (by C-G formula based on Cr of 1.01).  Basename 11/11/11 0407  VANCOTROUGH --  Leodis Binet --  VANCORANDOM <5.0  GENTTROUGH --  GENTPEAK --  GENTRANDOM --  TOBRATROUGH --  TOBRAPEAK --  TOBRARND --  AMIKACINPEAK --  AMIKACINTROU --  AMIKACIN --     Microbiology: Recent Results (from the past 720 hour(s))  URINE CULTURE     Status: Normal   Collection Time   11/09/11  3:06 PM      Component Value Range Status Comment   Specimen Description URINE, CLEAN CATCH   Final    Special Requests NONE   Final    Culture  Setup Time [id number]   Final    Colony Count 40,000 COLONIES/ML   Final    Culture     Final    Value: Multiple bacterial morphotypes present, none predominant. Suggest appropriate recollection if clinically indicated.   Report Status 11/10/2011 FINAL   Final   CULTURE, BLOOD (ROUTINE X 2)     Status: Normal (Preliminary result)   Collection Time   11/09/11  3:30 PM      Component Value Range Status Comment   Specimen Description BLOOD RIGHT FOREARM   Final    Special Requests BOTTLES DRAWN AEROBIC AND ANAEROBIC 5CC   Final    Culture NO GROWTH 1 DAY   Final    Report Status PENDING   Incomplete   CULTURE, BLOOD (ROUTINE X 2)     Status:  Normal (Preliminary result)   Collection Time   11/09/11  3:35 PM      Component Value Range Status Comment   Specimen Description BLOOD LEFT FOREARM   Final    Special Requests BOTTLES DRAWN AEROBIC AND ANAEROBIC 4CC   Final    Culture NO GROWTH 1 DAY   Final    Report Status PENDING   Incomplete     Medical History: History reviewed. No pertinent past medical history.  Medications:  Scheduled:     . acetaminophen  1,000 mg Oral Once  . heparin  5,000 Units Subcutaneous Q8H  . piperacillin-tazobactam (ZOSYN)  IV  3.375 g Intravenous Q8H  . potassium chloride  40 mEq Oral Q3H  . vancomycin  1,250 mg Intravenous Q12H  . DISCONTD: vancomycin  1,000 mg Intravenous Once   Assessment: Obese female with good renal fxn Vancomycin trough below goal on 1250mg  q12hrs SCr and WBC improved today   Goal of Therapy:  Vancomycin trough level 10-15 mcg/ml  Plan:  1) increase Vancomycin to 1500mg  IV Q12h 2) Zosyn 3.375gm IV Q8h 3) re-Check vancomycin trough at steady state  4) Monitor renal function and cx data  Valrie Hart A 11/11/2011,8:17 AM

## 2011-11-12 DIAGNOSIS — L02619 Cutaneous abscess of unspecified foot: Secondary | ICD-10-CM

## 2011-11-12 DIAGNOSIS — L03039 Cellulitis of unspecified toe: Secondary | ICD-10-CM

## 2011-11-12 LAB — CBC
MCH: 27.7 pg (ref 26.0–34.0)
MCV: 85 fL (ref 78.0–100.0)
Platelets: 273 10*3/uL (ref 150–400)
RBC: 3.86 MIL/uL — ABNORMAL LOW (ref 3.87–5.11)
RDW: 15.6 % — ABNORMAL HIGH (ref 11.5–15.5)
WBC: 8.5 10*3/uL (ref 4.0–10.5)

## 2011-11-12 MED ORDER — SULFAMETHOXAZOLE-TRIMETHOPRIM 800-160 MG PO TABS: 1.0000 | ORAL_TABLET | Freq: Two times a day (BID) | ORAL | Status: AC

## 2011-11-12 MED ORDER — OXYCODONE HCL 5 MG PO TABS: 5.0000 mg | ORAL_TABLET | Freq: Four times a day (QID) | ORAL | Status: AC | PRN

## 2011-11-12 NOTE — Progress Notes (Signed)
Discharge instructions given to pt. With understanding verbalized, pt. Placed in w/c and taken to car.

## 2011-11-12 NOTE — Discharge Instructions (Signed)

## 2011-11-12 NOTE — Discharge Summary (Signed)
Physician Discharge Summary  Patient ID: Carrie Mitchell MRN: [id number] DOB/AGE: Dec 05, 1970 41 y.o.  Admit date: 11/09/2011 Discharge date: 11/12/2011  Primary Care Physician:  No primary provider on file.   Discharge Diagnoses:    Principal Problem:  *Cellulitis and abscess of leg, except foot Active Problems:  Obesity    Medication List  As of 11/12/2011 11:09 AM   TAKE these medications         Aspirin-Caffeine 845-65 MG Pack   Take 1 Package by mouth every 8 (eight) hours as needed.      oxyCODONE 5 MG immediate release tablet   Commonly known as: Oxy IR/ROXICODONE   Take 1 tablet (5 mg total) by mouth every 6 (six) hours as needed.      sulfamethoxazole-trimethoprim 800-160 MG per tablet   Commonly known as: BACTRIM DS,SEPTRA DS   Take 1 tablet by mouth 2 (two) times daily.           Discharge exam: Blood pressure 148/98, pulse 78, temperature 99.1 F (37.3 C), temperature source Oral, resp. rate 20, height 5\' 11"  (1.803 m), weight 143.8 kg (317 lb 0.3 oz), SpO2 96.00%. NAD CTA B S1, S2 RRR Soft, bs+ nt RLE still has some edema and erythema, but improved  Disposition and Follow-up:  Discharge home and follow up with primary doctor in 1-2 weeks  Consults:  none   Significant Diagnostic Studies:  No results found.  Brief H and P: For complete details please refer to admission H and P, but in brief This 41 year old lady presents with the above symptoms for the last few days. She cannot specify exactly when it started but in the emergency room she appears to be very uncomfortable. She was documented to have a fever. She was also documented to have cellulitis in the right lower leg. Venous Doppler did not reveal DVT. The source of entry is probably is the right foot where there is dry skin with cracking. She is not otherwise hemodynamically unstable. She has a significant leukocytosis.     Hospital Course:  This lady was admitted to the hospital with  complaints of right lower extremity pain and swelling.  Dopplers were done which ruled out DVT.  She was febrile and had a significant leukocytosis.  She was started on empiric abx with vancomycin and zosyn.  She has now remained afebrile for the last 24 hours, and her leukocytosis has resolved.  She is able to walk, and is advised to keep her leg elevated.  She will be transitioned to oral bactrim and will follow up with her primary care doctor for further evaluation of this.  Time spent on Discharge:  Signed: Yevonne Yokum [facility] Pager: [phone] 11/12/2011, 11:09 AM

## 2011-11-14 LAB — CULTURE, BLOOD (ROUTINE X 2)
Culture: NO GROWTH
Culture: NO GROWTH

## 2011-11-24 NOTE — Progress Notes (Signed)
UR Chart Review Completed  

## 2012-02-14 ENCOUNTER — Encounter (HOSPITAL_COMMUNITY): Payer: Self-pay

## 2012-02-14 ENCOUNTER — Inpatient Hospital Stay (HOSPITAL_COMMUNITY)
Admission: EM | Admit: 2012-02-14 | Discharge: 2012-02-18 | DRG: 277 | Disposition: A | Discharge status: Home / Self Care | Payer: BC Managed Care – PPO | Attending: Internal Medicine | Admitting: Internal Medicine

## 2012-02-14 ENCOUNTER — Inpatient Hospital Stay (HOSPITAL_COMMUNITY): Payer: BC Managed Care – PPO

## 2012-02-14 DIAGNOSIS — M1710 Unilateral primary osteoarthritis, unspecified knee: Secondary | ICD-10-CM

## 2012-02-14 DIAGNOSIS — L259 Unspecified contact dermatitis, unspecified cause: Secondary | ICD-10-CM | POA: Diagnosis present

## 2012-02-14 DIAGNOSIS — E669 Obesity, unspecified: Secondary | ICD-10-CM | POA: Diagnosis present

## 2012-02-14 DIAGNOSIS — Z6841 Body Mass Index (BMI) 40.0 and over, adult: Secondary | ICD-10-CM

## 2012-02-14 DIAGNOSIS — R000 Tachycardia, unspecified: Secondary | ICD-10-CM | POA: Diagnosis present

## 2012-02-14 DIAGNOSIS — L03115 Cellulitis of right lower limb: Secondary | ICD-10-CM

## 2012-02-14 DIAGNOSIS — L02419 Cutaneous abscess of limb, unspecified: Principal | ICD-10-CM | POA: Diagnosis present

## 2012-02-14 DIAGNOSIS — R509 Fever, unspecified: Secondary | ICD-10-CM

## 2012-02-14 DIAGNOSIS — D72829 Elevated white blood cell count, unspecified: Secondary | ICD-10-CM | POA: Diagnosis present

## 2012-02-14 DIAGNOSIS — D649 Anemia, unspecified: Secondary | ICD-10-CM | POA: Diagnosis present

## 2012-02-14 DIAGNOSIS — E876 Hypokalemia: Secondary | ICD-10-CM | POA: Diagnosis present

## 2012-02-14 DIAGNOSIS — L309 Dermatitis, unspecified: Secondary | ICD-10-CM | POA: Diagnosis present

## 2012-02-14 HISTORY — DX: Dermatitis, unspecified: L30.9

## 2012-02-14 HISTORY — DX: Cellulitis, unspecified: L03.90

## 2012-02-14 LAB — BASIC METABOLIC PANEL
BUN: 8 mg/dL (ref 6–23)
CO2: 27 mEq/L (ref 19–32)
Calcium: 9.5 mg/dL (ref 8.4–10.5)
Chloride: 101 mEq/L (ref 96–112)
Creatinine, Ser: 0.86 mg/dL (ref 0.50–1.10)
GFR calc Af Amer: >90 mL/min (ref >90)
GFR calc non Af Amer: 83 mL/min — ABNORMAL LOW (ref >90)
Glucose, Bld: 151 mg/dL — ABNORMAL HIGH (ref 70–99)
Potassium: 3.3 mEq/L — ABNORMAL LOW (ref 3.5–5.1)
Sodium: 138 mEq/L (ref 135–145)

## 2012-02-14 LAB — CBC
HCT: 40.7 % (ref 36.0–46.0)
Hemoglobin: 13.4 g/dL (ref 12.0–15.0)
MCH: 28.3 pg (ref 26.0–34.0)
MCHC: 32.9 g/dL (ref 30.0–36.0)
MCV: 85.9 fL (ref 78.0–100.0)
Platelets: 327 10*3/uL (ref 150–400)
RBC: 4.74 MIL/uL (ref 3.87–5.11)
RDW: 14.2 % (ref 11.5–15.5)
WBC: 18.7 10*3/uL — ABNORMAL HIGH (ref 4.0–10.5)

## 2012-02-14 MED ORDER — HYDROMORPHONE HCL PF 1 MG/ML IJ SOLN
1.0000 mg | Freq: Once | INTRAMUSCULAR | Status: AC
  Administered 2012-02-14: 1 mg via INTRAVENOUS
  Filled 2012-02-14: qty 1

## 2012-02-14 MED ORDER — ACETAMINOPHEN 650 MG RE SUPP: 650.0000 mg | Freq: Four times a day (QID) | RECTAL | Status: DC | PRN

## 2012-02-14 MED ORDER — MORPHINE SULFATE 2 MG/ML IJ SOLN
1.0000 mg | INTRAMUSCULAR | Status: DC | PRN
  Administered 2012-02-15 – 2012-02-16 (×2): 1 mg via INTRAVENOUS
  Filled 2012-02-14 (×2): qty 1

## 2012-02-14 MED ORDER — VANCOMYCIN HCL IN DEXTROSE 1-5 GM/200ML-% IV SOLN
INTRAVENOUS | Status: AC
  Filled 2012-02-14: qty 400

## 2012-02-14 MED ORDER — ENOXAPARIN SODIUM 40 MG/0.4ML ~~LOC~~ SOLN
40.0000 mg | SUBCUTANEOUS | Status: DC
  Administered 2012-02-14 – 2012-02-18 (×5): 40 mg via SUBCUTANEOUS
  Filled 2012-02-14 (×5): qty 0.4

## 2012-02-14 MED ORDER — LEVOFLOXACIN IN D5W 500 MG/100ML IV SOLN
500.0000 mg | INTRAVENOUS | Status: DC
Start: 2012-02-14 — End: 2012-02-14
  Administered 2012-02-14: 500 mg via INTRAVENOUS
  Filled 2012-02-14: qty 100

## 2012-02-14 MED ORDER — SODIUM CHLORIDE 0.9 % IV BOLUS (SEPSIS)
1000.0000 mL | Freq: Once | INTRAVENOUS | Status: AC
  Administered 2012-02-14: 1000 mL via INTRAVENOUS

## 2012-02-14 MED ORDER — PIPERACILLIN-TAZOBACTAM 3.375 G IVPB
3.3750 g | Freq: Once | INTRAVENOUS | Status: AC
  Administered 2012-02-14: 3.375 g via INTRAVENOUS
  Filled 2012-02-14: qty 50

## 2012-02-14 MED ORDER — VANCOMYCIN HCL IN DEXTROSE 1-5 GM/200ML-% IV SOLN
1000.0000 mg | Freq: Once | INTRAVENOUS | Status: AC
  Administered 2012-02-14: 1000 mg via INTRAVENOUS
  Filled 2012-02-14: qty 200

## 2012-02-14 MED ORDER — HYDROMORPHONE HCL PF 1 MG/ML IJ SOLN
1.0000 mg | INTRAMUSCULAR | Status: AC | PRN
  Administered 2012-02-14 (×2): 1 mg via INTRAVENOUS
  Filled 2012-02-14 (×2): qty 1

## 2012-02-14 MED ORDER — ONDANSETRON HCL 4 MG/2ML IJ SOLN: 4.0000 mg | Freq: Three times a day (TID) | INTRAMUSCULAR | Status: DC | PRN

## 2012-02-14 MED ORDER — ONDANSETRON HCL 4 MG/2ML IJ SOLN
4.0000 mg | Freq: Once | INTRAMUSCULAR | Status: AC
  Administered 2012-02-14: 4 mg via INTRAVENOUS
  Filled 2012-02-14: qty 2

## 2012-02-14 MED ORDER — ACETAMINOPHEN 325 MG PO TABS
650.0000 mg | ORAL_TABLET | Freq: Four times a day (QID) | ORAL | Status: DC | PRN
  Administered 2012-02-14 – 2012-02-16 (×3): 650 mg via ORAL
  Filled 2012-02-14 (×3): qty 2

## 2012-02-14 MED ORDER — POTASSIUM CHLORIDE CRYS ER 20 MEQ PO TBCR
40.0000 meq | EXTENDED_RELEASE_TABLET | Freq: Once | ORAL | Status: AC
  Administered 2012-02-14: 40 meq via ORAL
  Filled 2012-02-14 (×2): qty 1

## 2012-02-14 MED ORDER — LEVOFLOXACIN IN D5W 500 MG/100ML IV SOLN
INTRAVENOUS | Status: AC
  Filled 2012-02-14: qty 100

## 2012-02-14 MED ORDER — ONDANSETRON HCL 4 MG/2ML IJ SOLN: 4.0000 mg | Freq: Four times a day (QID) | INTRAMUSCULAR | Status: DC | PRN

## 2012-02-14 MED ORDER — ONDANSETRON HCL 4 MG PO TABS: 4.0000 mg | ORAL_TABLET | Freq: Four times a day (QID) | ORAL | Status: DC | PRN

## 2012-02-14 MED ORDER — OXYCODONE HCL 5 MG PO TABS
5.0000 mg | ORAL_TABLET | ORAL | Status: DC | PRN
  Administered 2012-02-14 – 2012-02-17 (×6): 5 mg via ORAL
  Filled 2012-02-14 (×6): qty 1

## 2012-02-14 MED ORDER — VANCOMYCIN HCL IN DEXTROSE 1-5 GM/200ML-% IV SOLN
1000.0000 mg | INTRAVENOUS | Status: AC
  Administered 2012-02-14 – 2012-02-15 (×2): 1000 mg via INTRAVENOUS
  Filled 2012-02-14 (×2): qty 200

## 2012-02-14 NOTE — ED Notes (Signed)
Report given to [facility] RN Med surg room 322

## 2012-02-14 NOTE — ED Notes (Signed)
Patient states that she has been having right leg pain. I was in here in May for cellulitis in my right leg per pt. Running a fever, leg hurts, and is red per pt.

## 2012-02-14 NOTE — Progress Notes (Signed)
ANTIBIOTIC CONSULT NOTE - INITIAL  Pharmacy Consult for vancomycin Indication: right leg cellulitis  No Known Allergies  Patient Measurements: Height: 5' 9.5" (176.5 cm) Weight: 313 lb (141.976 kg) IBW/kg (Calculated) : 67.35  Adjusted Body Weight: 92 kg  Vital Signs: Temp: 103.1 F (39.5 C) (08/25 2252) Temp src: Oral (08/25 2252) BP: 97/65 mmHg (08/25 2036) Pulse Rate: 98  (08/25 2036) Intake/Output from previous day:   Intake/Output from this shift:    Labs:  [facility] 02/14/12 0718  WBC 18.7*  HGB 13.4  PLT 327  LABCREA --  CREATININE 0.86   Estimated Creatinine Clearance: 132.1 ml/min (by C-G formula based on Cr of 0.86).  Most likely CrCl est for age and condition is more likely to be 70-63ml/min  Microbiology: No results found for this or any previous visit (from the past 720 hour(s)).  Medical History: Past Medical History  Diagnosis Date  . Cellulitis     Medications:  Scheduled:    . enoxaparin (LOVENOX) injection  40 mg Subcutaneous Q24H  . HYDROmorphone  1 mg Intravenous Once  . ondansetron  4 mg Intravenous Once  . piperacillin-tazobactam  3.375 g Intravenous Once  . potassium chloride SA  40 mEq Oral Once  . sodium chloride  1,000 mL Intravenous Once  . vancomycin  1,000 mg Intravenous Once  . DISCONTD: levofloxacin (LEVAQUIN) IV  500 mg Intravenous Q24H   PRN: acetaminophen, acetaminophen, HYDROmorphone (DILAUDID) injection, morphine injection, ondansetron (ZOFRAN) IV, ondansetron, oxyCODONE, DISCONTD: ondansetron (ZOFRAN) IV Anti-infectives     Start     Dose/Rate Route Frequency Ordered Stop   02/14/12 1800   levofloxacin (LEVAQUIN) IVPB 500 mg  Status:  Discontinued        500 mg 100 mL/hr over 60 Minutes Intravenous Every 24 hours 02/14/12 1725 02/14/12 2313   02/14/12 0815  piperacillin-tazobactam (ZOSYN) IVPB 3.375 g       3.375 g 12.5 mL/hr over 240 Minutes Intravenous  Once 02/14/12 0804 02/14/12 1343   02/14/12 0715    vancomycin (VANCOCIN) IVPB 1000 mg/200 mL premix        1,000 mg 200 mL/hr over 60 Minutes Intravenous  Once 02/14/12 0713 02/14/12 0942         Assessment:   Patient with worsening sx's of right leg.  Per RN, doppler study ruled out DVT.  Patient's WBC and fever worsened, even after 1 dose Vancomycin 1gm & 1 dose Zosyn 3.375gm this morning approx 8-9am.  Levaquin 500mg  IV x 1 dose given 7:30pm tonight.  Goal of Therapy:    Vancomycin tx to be continued per Dr Orvan Falconer for staph coverage.  Will dose for aggressive single drug coverage with vancomycin trough 15-3mcg/ml.   Will need to consider broadening antibiotic coverage, depending on culture results and clinical condition.  Plan:  1.  Vancomycin 2000mg  tonight x 1 dose (more than 15hr since last 1 gm dose) 2.  Will recheck SCr in morning 3.  Clinical pharmacist to follow-up in morning for maintenance regimen - probably 1gm q8h or 1500mg  q12h  Scarlett Presto 02/14/2012,11:20 PM

## 2012-02-14 NOTE — ED Provider Notes (Signed)
History   This chart was scribed for Raeford Razor, MD by Charolett Bumpers . The patient was seen in room APA01/APA01. Patient's care was started at 0701.    CSN: [id number]  Arrival date & time 02/14/12  0612   First MD Initiated Contact with Patient 02/14/12 0701      Chief Complaint  Patient presents with  . Leg Pain    (Consider location/radiation/quality/duration/timing/severity/associated sxs/prior treatment) HPI Carrie Mitchell is a 41 y.o. female who presents to the Emergency Department complaining of sudden onset, right leg pain late last night around 1:30 am. Pt reports associated fever, chills, nausea and tingling. Pt reports she feels "pins and needles" in her leg at times. Pt denies any numbness or vomiting. Pt states that her symptoms are aggravated with movement. Pt denies any any recent falls or injuries. Pt states that she was hospitalized in May for cellulitis. Pt reports that today's symptoms feel similar. Pt denies any h/o DVT or diabetes.   PCP: Robbie Lis  Past Medical History  Diagnosis Date  . Cellulitis     Past Surgical History  Procedure Date  . Tubal ligation     History reviewed. No pertinent family history.  History  Substance Use Topics  . Smoking status: Never Smoker   . Smokeless tobacco: Not on file  . Alcohol Use: No    OB History    Grav Para Term Preterm Abortions TAB SAB Ect Mult Living                  Review of Systems  Constitutional: Positive for fever and chills.  Gastrointestinal: Positive for nausea. Negative for vomiting.  Musculoskeletal: Positive for myalgias.  All other systems reviewed and are negative.    Allergies  Review of patient's allergies indicates no known allergies.  Home Medications   Current Outpatient Rx  Name Route Sig Dispense Refill  . ASPIRIN-CAFFEINE 845-65 MG PO PACK Oral Take 1 Package by mouth every 8 (eight) hours as needed.      BP 110/70  Pulse 111  Temp 100.6 F (38.1 C)  (Oral)  Resp 20  Ht 5' 9.5" (1.765 m)  Wt 313 lb (141.976 kg)  BMI 45.56 kg/m2  SpO2 98%  Physical Exam  Nursing note and vitals reviewed. Constitutional: She appears well-developed and well-nourished. No distress.  HENT:  Head: Normocephalic and atraumatic.  Eyes: Conjunctivae are normal. Right eye exhibits no discharge. Left eye exhibits no discharge.  Neck: Neck supple.  Cardiovascular: Regular rhythm, normal heart sounds and intact distal pulses.  Tachycardia present.  Exam reveals no gallop and no friction rub.   No murmur heard. Pulmonary/Chest: Effort normal and breath sounds normal. No respiratory distress.  Abdominal: Soft. She exhibits no distension. There is no tenderness.  Musculoskeletal: She exhibits edema. She exhibits no tenderness.       Right leg swollen as compared to the left. Non-pitting edema. Erythema and increased warmth and tenderness to the mid tibial region distally. Good DP pulses.   Neurological: She is alert.  Skin: Skin is warm and dry.  Psychiatric: She has a normal mood and affect. Her behavior is normal. Thought content normal.    ED Course  Procedures (including critical care time)  DIAGNOSTIC STUDIES: Oxygen Saturation is 98% on room air, normal by my interpretation.    COORDINATION OF CARE:  07:08-Discussed planned course of treatment with the patient, who is agreeable at this time.   07:15-Medication Orders: Vancomycin (Vancocin) IVPB 1000 mg/200  mL premix-once; Sodium chloride 0.9% bolus 1,000 mL-once.   Results for orders placed during the hospital encounter of 02/14/12  CBC      Component Value Range   WBC 18.7 (*) 4.0 - 10.5 K/uL   RBC 4.74  3.87 - 5.11 MIL/uL   Hemoglobin 13.4  12.0 - 15.0 g/dL   HCT 45.4  09.8 - 11.9 %   MCV 85.9  78.0 - 100.0 fL   MCH 28.3  26.0 - 34.0 pg   MCHC 32.9  30.0 - 36.0 g/dL   RDW 14.7  82.9 - 56.2 %   Platelets 327  150 - 400 K/uL  BASIC METABOLIC PANEL      Component Value Range   Sodium 138   135 - 145 mEq/L   Potassium 3.3 (*) 3.5 - 5.1 mEq/L   Chloride 101  96 - 112 mEq/L   CO2 27  19 - 32 mEq/L   Glucose, Bld 151 (*) 70 - 99 mg/dL   BUN 8  6 - 23 mg/dL   Creatinine, Ser 1.30  0.50 - 1.10 mg/dL   Calcium 9.5  8.4 - 86.5 mg/dL   GFR calc non Af Amer 83 (*) >90 mL/min   GFR calc Af Amer >90  >90 mL/min     No results found.   1. Cellulitis of right leg   2. Fever   3. Hypokalemia    MDM  40yf with RLE pain. Atraumatic. Suspect cellulitis. Consider DVT but feel less likely. Pt reports very similar symptoms as previous cellulitis in same extremity when was admitted in May. Pt is obese, but otherwise no significant risk factors for DVT. Febrile and significant leukocytosis. Pt appears pretty uncomfortable. No hx of diabetes. Given systemic symptoms, will admit.   I personally preformed the services scribed in my presence. The recorded information has been reviewed and considered. Raeford Razor, MD.       Raeford Razor, MD 02/14/12 [phone]

## 2012-02-14 NOTE — H&P (Signed)
[facility] History and Physical  Carrie Mitchell ZOX:[id number] DOB: [birth year] DOA: 02/14/2012  Referring physician: Belinda Block, ER Physician PCP: Terie Purser, PA for [facility]  Chief Complaint: Right leg pain  HPI: Carrie Mitchell is a 41 y.o. female with past medical history of obesity and severely dry feet 2 as had complaints of pain and tenderness as well as erythema in her right leg below the calf for the last 3 days. The pain became so severe that she came into the emergency room. She also spent most of the last 2 days in bed, and in the last day noted some severe pain and tenderness in the right thigh as well.  In the emergency room, patient was noted to have an elevated white blood cell count of 18 and signs consistent with a cellulitis of the right leg. She was given a dose of IV vancomycin. There were no open sores or wounds. Because of continued pain and erythema, it was felt best the patient coming to the hospital for further treatment. Hospitals were called for evaluation.  Review of Systems: The patient denies any headaches, vision changes, dysphasia, chest pain, palpitations, shortness of breath, wheeze, cough, abdominal pain, hematuria, dysuria, constipation, diarrhea. She complains of right thigh tenderness and it is painful. She also complains of right leg pain below the calf with swelling, erythema and pain. She complains of chronic lower extremity pain in her feet which has been worked up and has been told it is a severe dry feet. Her review systems otherwise negative.  Past Medical History  Diagnosis Date  . Cellulitis    Past Surgical History  Procedure Date  . Tubal ligation    Social History:  reports that she has never smoked. She does not have any smokeless tobacco history on file. She reports that she does not drink alcohol or use illicit drugs. Patient lives at home by herself. She is normally able to participate in most activities of daily  living.  No Known Allergies  Family history: Patient states no medical problems run in her family  Prior to Admission medications   Medication Sig Start Date End Date Taking? Authorizing Provider  Aspirin-Caffeine 845-65 MG PACK Take 1 Package by mouth every 8 (eight) hours as needed.    Historical Provider, MD   Physical Exam: Filed Vitals:   02/14/12 8119 02/14/12 0938  BP: 110/70 103/73  Pulse: 111 108  Temp: 100.6 F (38.1 C) 100.9 F (38.3 C)  TempSrc: Oral Oral  Resp: 20 16  Height: 5' 9.5" (1.765 m)   Weight: 141.976 kg (313 lb)   SpO2: 98% 95%     General:  Alert and oriented x3, moderate distress secondary to leg pain, fatigue, looks slightly older than stated age  Eyes: Sclera nonicteric, extraocular muscles are intact  ENT: Normocephalic, atraumatic, mucous membranes are dry and  Neck: Neck is supple, no JVD or thyromegaly  Cardiovascular: Borderline tachycardia, no murmurs, regular rhythm  Respiratory: Clear to auscultation bilaterally  Abdomen: Soft, obese, nontender, positive bowel sounds  Skin: Patient has some erythema over the right leg below her calf going to her foot. No evidence of any skin tears or lesions  Musculoskeletal: No clubbing or cyanosis, trace pitting edema. Patient also has some market tenderness but no evidence of erythema on her right side.-Inner aspect  Psychiatric: No evidence of any acute psychoses  Neurologic: No focal neurological deficits  Labs on Admission:  Basic Metabolic Panel:  Lab 02/14/12 1478  NA 138  K 3.3*  CL 101  CO2 27  GLUCOSE 151*  BUN 8  CREATININE 0.86  CALCIUM 9.5  MG --  PHOS --   CBC:  Lab 02/14/12 0718  WBC 18.7*  NEUTROABS --  HGB 13.4  HCT 40.7  MCV 85.9  PLT 327     Assessment/Plan Active Problems:  Cellulitis and abscess of leg, except foot: Patient is Arrie receive one dose of IV vancomycin. Will recheck white blood cell count and see if it has come down. Can narrow coverage  accordingly. Will check lower extremity Doppler to rule out DVT of the thigh.  Hypokalemia: Replace.   Obesity: Noted   Code Status: Full code Family Communication: Plan discussed with patient Disposition Plan: Hopefully home in the next few days  Time spent: 45 minutes  Hollice Espy [facility] Pager [phone]  If 7PM-7AM, please contact night-coverage [email or web address] Password [facility] 02/14/2012, 10:17 AM

## 2012-02-15 LAB — CBC WITH DIFFERENTIAL/PLATELET
Basophils Relative: 0 % (ref 0–1)
Eosinophils Absolute: 0 10*3/uL (ref 0.0–0.7)
Eosinophils Relative: 0 % (ref 0–5)
Hemoglobin: 11.4 g/dL — ABNORMAL LOW (ref 12.0–15.0)
MCH: 28.3 pg (ref 26.0–34.0)
MCHC: 32.9 g/dL (ref 30.0–36.0)
MCV: 86.1 fL (ref 78.0–100.0)
Monocytes Absolute: 0.9 10*3/uL (ref 0.1–1.0)
Monocytes Relative: 5 % (ref 3–12)
Neutrophils Relative %: 79 % — ABNORMAL HIGH (ref 43–77)

## 2012-02-15 LAB — CBC
HCT: 36.3 % (ref 36.0–46.0)
MCHC: 33.1 g/dL (ref 30.0–36.0)
MCV: 87.3 fL (ref 78.0–100.0)
RDW: 14.9 % (ref 11.5–15.5)
WBC: 18.7 10*3/uL — ABNORMAL HIGH (ref 4.0–10.5)

## 2012-02-15 LAB — CREATININE, SERUM: GFR calc non Af Amer: 58 mL/min — ABNORMAL LOW (ref >90)

## 2012-02-15 MED ORDER — CALAMINE EX LOTN
TOPICAL_LOTION | Freq: Two times a day (BID) | CUTANEOUS | Status: DC
  Administered 2012-02-15 – 2012-02-18 (×7): via TOPICAL
  Filled 2012-02-15: qty 118

## 2012-02-15 MED ORDER — VANCOMYCIN HCL 1000 MG IV SOLR
1500.0000 mg | Freq: Two times a day (BID) | INTRAVENOUS | Status: DC
  Administered 2012-02-15 – 2012-02-18 (×6): 1500 mg via INTRAVENOUS
  Filled 2012-02-15 (×9): qty 1500

## 2012-02-15 NOTE — Plan of Care (Signed)
Problem: Phase I Progression Outcomes Goal: OOB as tolerated unless otherwise ordered Outcome: Progressing Pt ambulates back and forth to the bathroom with minimal assistance. Goal: Wound assessment- dressing change as appropriate Outcome: Progressing Wound is not draining at this time.

## 2012-02-15 NOTE — Progress Notes (Signed)
ANTIBIOTIC CONSULT NOTE  Pharmacy Consult for Vancomycin Indication: cellulitis  No Known Allergies  Patient Measurements: Height: 5' 9.5" (176.5 cm) Weight: 313 lb (141.976 kg) IBW/kg (Calculated) : 67.35   Vital Signs: Temp: 101.4 F (38.6 C) (08/26 0424) Temp src: Oral (08/26 0424) BP: 116/75 mmHg (08/26 0424) Pulse Rate: 89  (08/26 0424) Intake/Output from previous day: 08/25 0701 - 08/26 0700 In: 990 [P.O.:240; IV Piggyback:750] Out: -  Intake/Output from this shift:    Labs:  Basename 02/15/12 0523 02/14/12 0718  WBC 18.7* 18.7*  HGB 12.0 13.4  PLT 231 327  LABCREA -- --  CREATININE 1.16* 0.86   Estimated Creatinine Clearance: 97.9 ml/min (by C-G formula based on Cr of 1.16). No results found for this basename: VANCOTROUGH:2,VANCOPEAK:2,VANCORANDOM:2,GENTTROUGH:2,GENTPEAK:2,GENTRANDOM:2,TOBRATROUGH:2,TOBRAPEAK:2,TOBRARND:2,AMIKACINPEAK:2,AMIKACINTROU:2,AMIKACIN:2, in the last 72 hours   Microbiology: Recent Results (from the past 720 hour(s))  CULTURE, BLOOD (ROUTINE X 2)     Status: Normal (Preliminary result)   Collection Time   02/14/12 11:19 PM      Component Value Range Status Comment   Specimen Description Blood RIGHT ANTECUBITAL   Final    Special Requests BOTTLES DRAWN AEROBIC AND ANAEROBIC 6CC   Final    Culture NO GROWTH 1 DAY   Final    Report Status PENDING   Incomplete   CULTURE, BLOOD (ROUTINE X 2)     Status: Normal (Preliminary result)   Collection Time   02/14/12 11:22 PM      Component Value Range Status Comment   Specimen Description Blood BLOOD LEFT HAND   Final    Special Requests BOTTLES DRAWN AEROBIC ONLY 6CC   Final    Culture NO GROWTH 1 DAY   Final    Report Status PENDING   Incomplete     Medical History: Past Medical History  Diagnosis Date  . Cellulitis     Medications:  Scheduled:    . enoxaparin (LOVENOX) injection  40 mg Subcutaneous Q24H  . piperacillin-tazobactam  3.375 g Intravenous Once  . vancomycin  1,000 mg  Intravenous Q1 Hr x 2  . DISCONTD: levofloxacin (LEVAQUIN) IV  500 mg Intravenous Q24H   Assessment: 41 yo obese F admitted with cellulitis, abscess of right leg. Renal function declining since admission.   Goal of Therapy:  Vancomycin trough level 10-15 mcg/ml  Plan:  1) Vancomycin 1500mg  IV Q12h 2) Check Vancomycin trough at steady state 3) Monitor renal function and cx data   Elson Clan 02/15/2012,10:56 AM

## 2012-02-15 NOTE — Care Management Note (Signed)
    Page 1 of 1   02/18/2012     1:38:37 PM   CARE MANAGEMENT NOTE 02/18/2012  Patient:  Carrie Mitchell, Carrie Mitchell   Account Number:  [id number]  Date Initiated:  02/15/2012  Documentation initiated by:  Rosemary Holms  Subjective/Objective Assessment:   Pt admitted from home where she lives with spouse. Admitted with Cellulitis of R. leg. Very groggy during CM visit. Febrile.     Action/Plan:   02/16/12 Spoke to pt at bedside today. Do not anticipate HH needs   Anticipated DC Date:  02/18/2012   Anticipated DC Plan:  HOME/SELF CARE      DC Planning Services  CM consult      Choice offered to / List presented to:             Status of service:  Completed, signed off Medicare Important Message given?   (If response is "NO", the following Medicare IM given date fields will be blank) Date Medicare IM given:   Date Additional Medicare IM given:    Discharge Disposition:  HOME/SELF CARE  Per UR Regulation:    If discussed at Long Length of Stay Meetings, dates discussed:    Comments:  02/15/12 1000 Carrie Blaszczyk Leanord Hawking RN BSN CM

## 2012-02-15 NOTE — Progress Notes (Signed)
[facility] PROGRESS NOTE  [place] ZHY:[id number] DOB: 12-21-1970 DOA: 02/14/2012 PCP: Terie Purser, PA  Assessment/Plan: Active Problems:  Cellulitis of leg, except foot  Obesity  Hypokalemia  1. Cellulitis: Patient continued to have febrile spikes. White blood cell count did not change. This is despite doses of Zosyn and vancomycin. We'll repeat CBC this afternoon approximately 10 hours from last lab work. If labs are still elevated, will check CT scan of leg to look for abscess. Note that DVT was ruled out by Doppler yesterday. 2. Obesity: Noted. 3. Hypokalemia: Replaced on admission  Code Status: Full Family Communication: Updated patient's husband yesterday Disposition Plan: Discharge home eventually   Brief narrative: 41 year old African American female past medical history only for obesity presents with a right lower extremity cellulitis  Consultants:  None  Procedures: Right lower extremity Doppler done 8/25-negative for DVT  Antibiotics:  IV vancomycin day 2  IV Zosyn day 2  Subjective: Patient complains of continued right lower extremity pain. She also has a mild headache today. No other complaints. No shortness of breath or chest pain  Objective: Filed Vitals:   02/14/12 2252 02/15/12 0100 02/15/12 0203 02/15/12 0424  BP:    116/75  Pulse:    89  Temp: 103.1 F (39.5 C) 102.3 F (39.1 C) 101.1 F (38.4 C) 101.4 F (38.6 C)  TempSrc: Oral Oral Oral Oral  Resp:    16  Height:      Weight:      SpO2:    98%    Intake/Output Summary (Last 24 hours) at 02/15/12 1416 Last data filed at 02/15/12 0300  Gross per 24 hour  Intake    990 ml  Output      0 ml  Net    990 ml   Filed Weights   02/14/12 2952  Weight: 141.976 kg (313 lb)    Exam:   General:  Alert and oriented x3, moderate distress secondary to pain, fatigue, looks older than stated age  Cardiovascular: Regular rate and rhythm, S1-S2  Respiratory: Clear to  auscultation bilaterally  Abdomen: Soft, nontender, obese, positive bowel sounds  Extremities: No clubbing or cyanosis, trace pitting edema. Right lower extremity from the anterior aspect of the leg from mid calf down to ankle shows significant erythema. Do not feel that this is improved much from yesterday.  Data Reviewed: Basic Metabolic Panel:  Lab 02/15/12 8413 02/14/12 0718  NA -- 138  K -- 3.3*  CL -- 101  CO2 -- 27  GLUCOSE -- 151*  BUN -- 8  CREATININE 1.16* 0.86  CALCIUM -- 9.5  MG -- --  PHOS -- --   CBC:  Lab 02/15/12 0523 02/14/12 0718  WBC 18.7* 18.7*  NEUTROABS -- --  HGB 12.0 13.4  HCT 36.3 40.7  MCV 87.3 85.9  PLT 231 327      Studies: US Venous Img Lower Unilateral Right  02/14/2012  IMPRESSION: No evidence of right lower extremity deep vein thrombosis.   Original Report Authenticated By: Richarda Overlie, M.D.     Scheduled Meds:   . enoxaparin (LOVENOX) injection  40 mg Subcutaneous Q24H  . vancomycin  1,500 mg Intravenous Q12H  . vancomycin  1,000 mg Intravenous Q1 Hr x 2  . DISCONTD: levofloxacin (LEVAQUIN) IV  500 mg Intravenous Q24H     Time spent: 40 minutes    Hollice Espy  [facility] Pager [phone]. If 8PM-8AM, please contact night-coverage at [email or web address], password [facility] 02/15/2012, 2:16 PM  LOS: 1 day

## 2012-02-15 NOTE — Progress Notes (Signed)
UR Chart Review Completed  

## 2012-02-16 ENCOUNTER — Encounter (HOSPITAL_COMMUNITY): Payer: Self-pay | Admitting: Internal Medicine

## 2012-02-16 DIAGNOSIS — L309 Dermatitis, unspecified: Secondary | ICD-10-CM | POA: Diagnosis present

## 2012-02-16 DIAGNOSIS — L259 Unspecified contact dermatitis, unspecified cause: Secondary | ICD-10-CM

## 2012-02-16 HISTORY — DX: Dermatitis, unspecified: L30.9

## 2012-02-16 LAB — CBC
MCH: 28.5 pg (ref 26.0–34.0)
MCHC: 33.2 g/dL (ref 30.0–36.0)
MCV: 85.9 fL (ref 78.0–100.0)
Platelets: 271 10*3/uL (ref 150–400)
RBC: 3.96 MIL/uL (ref 3.87–5.11)
RDW: 14.6 % (ref 11.5–15.5)

## 2012-02-16 LAB — BASIC METABOLIC PANEL
Calcium: 8.8 mg/dL (ref 8.4–10.5)
Creatinine, Ser: 1 mg/dL (ref 0.50–1.10)
GFR calc Af Amer: 80 mL/min — ABNORMAL LOW (ref >90)
GFR calc non Af Amer: 69 mL/min — ABNORMAL LOW (ref >90)
Sodium: 139 mEq/L (ref 135–145)

## 2012-02-16 MED ORDER — FLUCONAZOLE 100 MG PO TABS
200.0000 mg | ORAL_TABLET | Freq: Once | ORAL | Status: AC
  Administered 2012-02-16: 200 mg via ORAL
  Filled 2012-02-16: qty 2

## 2012-02-16 MED ORDER — TERBINAFINE HCL 1 % EX CREA
TOPICAL_CREAM | Freq: Every day | CUTANEOUS | Status: DC
  Administered 2012-02-16: 15:00:00 via TOPICAL
  Filled 2012-02-16: qty 12

## 2012-02-16 MED ORDER — DIPHENHYDRAMINE HCL 25 MG PO CAPS
25.0000 mg | ORAL_CAPSULE | Freq: Four times a day (QID) | ORAL | Status: DC | PRN
  Administered 2012-02-16: 25 mg via ORAL
  Filled 2012-02-16: qty 1

## 2012-02-16 NOTE — Progress Notes (Signed)
02/16/12 1849 Patient c/o unrelieved itching to right lower leg area this evening, stated began this evening. Had started lamisil ointment this afternoon per new orders. Notified Dr Sherrie Mustache, telephone orders received to d/c lamisil and administer benadryl 25 mg po every 6 hours PRN for itching. Nursing to monitor. Carrie Mitchell

## 2012-02-16 NOTE — Progress Notes (Signed)
ANTIBIOTIC CONSULT NOTE  Pharmacy Consult for Vancomycin Indication: cellulitis  No Known Allergies  Patient Measurements: Height: 5' 9.5" (176.5 cm) Weight: 313 lb (141.976 kg) IBW/kg (Calculated) : 67.35   Vital Signs: Temp: 99 F (37.2 C) (08/27 0517) Temp src: Oral (08/27 0517) BP: 126/83 mmHg (08/27 0517) Pulse Rate: 79  (08/27 0517) Intake/Output from previous day: 08/26 0701 - 08/27 0700 In: 1300 [P.O.:800; IV Piggyback:500] Out: -  Intake/Output from this shift: Total I/O In: 240 [P.O.:240] Out: -   Labs:  Basename 02/16/12 0515 02/15/12 1510 02/15/12 0523 02/14/12 0718  WBC 13.1* 17.6* 18.7* --  HGB 11.3* 11.4* 12.0 --  PLT 271 273 231 --  LABCREA -- -- -- --  CREATININE 1.00 -- 1.16* 0.86   Estimated Creatinine Clearance: 113.6 ml/min (by C-G formula based on Cr of 1). No results found for this basename: VANCOTROUGH:2,VANCOPEAK:2,VANCORANDOM:2,GENTTROUGH:2,GENTPEAK:2,GENTRANDOM:2,TOBRATROUGH:2,TOBRAPEAK:2,TOBRARND:2,AMIKACINPEAK:2,AMIKACINTROU:2,AMIKACIN:2, in the last 72 hours   Microbiology: Recent Results (from the past 720 hour(s))  CULTURE, BLOOD (ROUTINE X 2)     Status: Normal (Preliminary result)   Collection Time   02/14/12 11:19 PM      Component Value Range Status Comment   Specimen Description Blood RIGHT ANTECUBITAL   Final    Special Requests BOTTLES DRAWN AEROBIC AND ANAEROBIC 6CC   Final    Culture NO GROWTH 1 DAY   Final    Report Status PENDING   Incomplete   CULTURE, BLOOD (ROUTINE X 2)     Status: Normal (Preliminary result)   Collection Time   02/14/12 11:22 PM      Component Value Range Status Comment   Specimen Description Blood BLOOD LEFT HAND   Final    Special Requests BOTTLES DRAWN AEROBIC ONLY 6CC   Final    Culture NO GROWTH 1 DAY   Final    Report Status PENDING   Incomplete     Medical History: Past Medical History  Diagnosis Date  . Cellulitis     Medications:  Scheduled:     . calamine   Topical BID  .  enoxaparin (LOVENOX) injection  40 mg Subcutaneous Q24H  . vancomycin  1,500 mg Intravenous Q12H   Assessment: 41 yo obese F admitted with cellulitis, abscess of right leg. Estimated Creatinine Clearance: 113.6 ml/min (by C-G formula based on Cr of 1).  Goal of Therapy:  Vancomycin trough level 10-15 mcg/ml  Plan:  1) Vancomycin 1500mg  IV Q12h 2) Check Vancomycin trough tomorrow 3) Monitor renal function and cx data   Valrie Hart A 02/16/2012,10:26 AM

## 2012-02-16 NOTE — Progress Notes (Signed)
Subjective: The patient says that her left leg feels and looks about the same. She has very little discomfort at rest but more discomfort when she stands and walks.  Objective: Vital signs in last 24 hours: Filed Vitals:   02/15/12 0203 02/15/12 0424 02/15/12 1450 02/16/12 0517  BP:  116/75 114/77 126/83  Pulse:  89 97 79  Temp: 101.1 F (38.4 C) 101.4 F (38.6 C) 100.1 F (37.8 C) 99 F (37.2 C)  TempSrc: Oral Oral  Oral  Resp:  16 16 19   Height:      Weight:      SpO2:  98% 97% 97%    Intake/Output Summary (Last 24 hours) at 02/16/12 1241 Last data filed at 02/16/12 0900  Gross per 24 hour  Intake   1300 ml  Output      0 ml  Net   1300 ml    Weight change:  Physical exam: Lungs: Clear to auscultation bilaterally. Heart: S1, S2, with no murmurs rubs or gallops. Abdomen: Obese, positive bowel sounds, soft, nontender, nondistended. Extremities: Right lower extremity with moderate erythema starting at from below her knee to above her ankle. Based on the markings, there is some regression of erythema. Her right leg is globally edematous, nonpitting at 2+. Mild tenderness to palpation. Plantar surfaces of both feet with extremely dry plaque-like lesions.   Lab Results: Basic Metabolic Panel:  Basename 02/16/12 0515 02/15/12 0523 02/14/12 0718  NA 139 -- 138  K 3.3* -- 3.3*  CL 107 -- 101  CO2 24 -- 27  GLUCOSE 100* -- 151*  BUN 7 -- 8  CREATININE 1.00 1.16* --  CALCIUM 8.8 -- 9.5  MG -- -- --  PHOS -- -- --   Liver Function Tests: No results found for this basename: AST:2,ALT:2,ALKPHOS:2,BILITOT:2,PROT:2,ALBUMIN:2 in the last 72 hours No results found for this basename: LIPASE:2,AMYLASE:2 in the last 72 hours No results found for this basename: AMMONIA:2 in the last 72 hours CBC:  Basename 02/16/12 0515 02/15/12 1510  WBC 13.1* 17.6*  NEUTROABS -- 13.9*  HGB 11.3* 11.4*  HCT 34.0* 34.7*  MCV 85.9 86.1  PLT 271 273   Cardiac Enzymes: No results found for  this basename: CKTOTAL:3,CKMB:3,CKMBINDEX:3,TROPONINI:3 in the last 72 hours BNP: No results found for this basename: PROBNP:3 in the last 72 hours D-Dimer: No results found for this basename: DDIMER:2 in the last 72 hours CBG: No results found for this basename: GLUCAP:6 in the last 72 hours Hemoglobin A1C: No results found for this basename: HGBA1C in the last 72 hours Fasting Lipid Panel: No results found for this basename: CHOL,HDL,LDLCALC,TRIG,CHOLHDL,LDLDIRECT in the last 72 hours Thyroid Function Tests: No results found for this basename: TSH,T4TOTAL,FREET4,T3FREE,THYROIDAB in the last 72 hours Anemia Panel: No results found for this basename: VITAMINB12,FOLATE,FERRITIN,TIBC,IRON,RETICCTPCT in the last 72 hours Coagulation: No results found for this basename: LABPROT:2,INR:2 in the last 72 hours Urine Drug Screen: Drugs of Abuse  No results found for this basename: labopia, cocainscrnur, labbenz, amphetmu, thcu, labbarb    Alcohol Level: No results found for this basename: ETH:2 in the last 72 hours Urinalysis: No results found for this basename: COLORURINE:2,APPERANCEUR:2,LABSPEC:2,PHURINE:2,GLUCOSEU:2,HGBUR:2,BILIRUBINUR:2,KETONESUR:2,PROTEINUR:2,UROBILINOGEN:2,NITRITE:2,LEUKOCYTESUR:2 in the last 72 hours Misc. Labs:   Micro: Recent Results (from the past 240 hour(s))  CULTURE, BLOOD (ROUTINE X 2)     Status: Normal (Preliminary result)   Collection Time   02/14/12 11:19 PM      Component Value Range Status Comment   Specimen Description BLOOD RIGHT ANTECUBITAL   Final  Special Requests BOTTLES DRAWN AEROBIC AND ANAEROBIC 6CC   Final    Culture NO GROWTH 2 DAYS   Final    Report Status PENDING   Incomplete   CULTURE, BLOOD (ROUTINE X 2)     Status: Normal (Preliminary result)   Collection Time   02/14/12 11:22 PM      Component Value Range Status Comment   Specimen Description BLOOD LEFT HAND   Final    Special Requests BOTTLES DRAWN AEROBIC ONLY 6CC   Final     Culture NO GROWTH 2 DAYS   Final    Report Status PENDING   Incomplete     Studies/Results: No results found.  Medications:  Scheduled:   . calamine   Topical BID  . enoxaparin (LOVENOX) injection  40 mg Subcutaneous Q24H  . fluconazole  200 mg Oral Once  . terbinafine   Topical Daily  . vancomycin  1,500 mg Intravenous Q12H   Continuous:  ZOX:[id number], acetaminophen, morphine injection, ondansetron (ZOFRAN) IV, ondansetron, oxyCODONE  Assessment: Active Problems:  Cellulitis and abscess of leg, except foot  Obesity  Hypokalemia  Dermatitis   1. Right lower extremity cellulitis. She is afebrile so far today. Her white blood cell count is decreasing. We'll continue Zosyn and vancomycin. I wonder, given the appearance of her plantar surfaces, if she has a superimposed fungal infection. The patient was told that she had excessively dry skin by her dermatologist.  Plan:  1. Continue antibiotics and supportive treatment. 2. We'll give oral Diflucan once and start topical Lamisil as a trial.   LOS: 2 days   Jayna Mulnix 02/16/2012, 12:41 PM

## 2012-02-17 LAB — VANCOMYCIN, TROUGH: Vancomycin Tr: 15.3 ug/mL (ref 10.0–20.0)

## 2012-02-17 LAB — CBC
Hemoglobin: 11.2 g/dL — ABNORMAL LOW (ref 12.0–15.0)
MCH: 28.4 pg (ref 26.0–34.0)
Platelets: 266 10*3/uL (ref 150–400)
RBC: 3.95 MIL/uL (ref 3.87–5.11)

## 2012-02-17 LAB — BASIC METABOLIC PANEL
Calcium: 8.8 mg/dL (ref 8.4–10.5)
GFR calc Af Amer: >90 mL/min (ref >90)
GFR calc non Af Amer: 84 mL/min — ABNORMAL LOW (ref >90)
Potassium: 3.2 mEq/L — ABNORMAL LOW (ref 3.5–5.1)
Sodium: 139 mEq/L (ref 135–145)

## 2012-02-17 LAB — PRO B NATRIURETIC PEPTIDE: Pro B Natriuretic peptide (BNP): 221.6 pg/mL — ABNORMAL HIGH (ref 0–125)

## 2012-02-17 MED ORDER — POTASSIUM CHLORIDE CRYS ER 20 MEQ PO TBCR
30.0000 meq | EXTENDED_RELEASE_TABLET | Freq: Two times a day (BID) | ORAL | Status: AC
  Administered 2012-02-17 (×2): 30 meq via ORAL
  Filled 2012-02-17 (×2): qty 1

## 2012-02-17 MED ORDER — SODIUM CHLORIDE 0.9 % IJ SOLN
INTRAMUSCULAR | Status: AC
  Administered 2012-02-17: 10 mL
  Filled 2012-02-17: qty 3

## 2012-02-17 NOTE — Progress Notes (Signed)
Patient ambulated in hallway with no issues. Patient tolerated well. Will continue to monitor.

## 2012-02-17 NOTE — Progress Notes (Signed)
ANTIBIOTIC CONSULT NOTE  Pharmacy Consult for Vancomycin Indication: cellulitis  No Known Allergies  Patient Measurements: Height: 5' 9.5" (176.5 cm) Weight: 315 lb 12.8 oz (143.246 kg) IBW/kg (Calculated) : 67.35   Vital Signs: Temp: 98.3 F (36.8 C) (08/28 0510) Temp src: Oral (08/28 0510) BP: 115/76 mmHg (08/28 0510) Pulse Rate: 72  (08/28 0510) Intake/Output from previous day: 08/27 0701 - 08/28 0700 In: 1730 [P.O.:720; I.V.:10; IV Piggyback:1000] Out: -  Intake/Output from this shift: Total I/O In: 300 [P.O.:300] Out: -   Labs:  Basename 02/17/12 0526 02/16/12 0515 02/15/12 1510 02/15/12 0523  WBC 7.3 13.1* 17.6* --  HGB 11.2* 11.3* 11.4* --  PLT 266 271 273 --  LABCREA -- -- -- --  CREATININE 0.85 1.00 -- 1.16*   Estimated Creatinine Clearance: 134.3 ml/min (by C-G formula based on Cr of 0.85).  Basename 02/17/12 1146  VANCOTROUGH 15.3  VANCOPEAK --  VANCORANDOM --  GENTTROUGH --  GENTPEAK --  GENTRANDOM --  TOBRATROUGH --  TOBRAPEAK --  TOBRARND --  AMIKACINPEAK --  AMIKACINTROU --  AMIKACIN --    Microbiology: Recent Results (from the past 720 hour(s))  CULTURE, BLOOD (ROUTINE X 2)     Status: Normal (Preliminary result)   Collection Time   02/14/12 11:19 PM      Component Value Range Status Comment   Specimen Description BLOOD RIGHT ANTECUBITAL   Final    Special Requests BOTTLES DRAWN AEROBIC AND ANAEROBIC 6CC   Final    Culture NO GROWTH 3 DAYS   Final    Report Status PENDING   Incomplete   CULTURE, BLOOD (ROUTINE X 2)     Status: Normal (Preliminary result)   Collection Time   02/14/12 11:22 PM      Component Value Range Status Comment   Specimen Description BLOOD LEFT HAND   Final    Special Requests BOTTLES DRAWN AEROBIC ONLY 6CC   Final    Culture NO GROWTH 3 DAYS   Final    Report Status PENDING   Incomplete     Medical History: Past Medical History  Diagnosis Date  . Cellulitis   . Dermatitis 02/16/2012    Medications:    Scheduled:     . calamine   Topical BID  . enoxaparin (LOVENOX) injection  40 mg Subcutaneous Q24H  . potassium chloride  30 mEq Oral BID  . sodium chloride      . vancomycin  1,500 mg Intravenous Q12H  . DISCONTD: terbinafine   Topical Daily   Assessment: 41 yo obese F admitted with cellulitis, abscess of right leg. Estimated Creatinine Clearance: 134.3 ml/min (by C-G formula based on Cr of 0.85). Trough level on target  Goal of Therapy:  Vancomycin trough level 10-15 mcg/ml  Plan:  1) continue Vancomycin 1500mg  IV Q12h 2) Check Vancomycin trough weekly while on Vanco 3) Monitor renal function and cx data  4) duration of therapy per MD  Valrie Hart A 02/17/2012,2:14 PM

## 2012-02-17 NOTE — Progress Notes (Signed)
Subjective: The patient says her right leg is still sore, but she has noticed less redness. She had itching of her right leg yesterday, but she does not necessarily associated with the Lamisil that was started. She had had intermittent right leg itching prior to the Lamisil.  Objective: Vital signs in last 24 hours: Filed Vitals:   02/16/12 0517 02/16/12 1346 02/16/12 2104 02/17/12 0510  BP: 126/83 113/79 126/85 115/76  Pulse: 79 89 76 72  Temp: 99 F (37.2 C) 98.7 F (37.1 C) 99 F (37.2 C) 98.3 F (36.8 C)  TempSrc: Oral  Oral Oral  Resp: 19 18 18 18   Height:      Weight:    143.246 kg (315 lb 12.8 oz)  SpO2: 97% 94% 99% 99%    Intake/Output Summary (Last 24 hours) at 02/17/12 1123 Last data filed at 02/17/12 2130  Gross per 24 hour  Intake   1790 ml  Output      0 ml  Net   1790 ml    Weight change:  Physical exam: Lungs: Clear to auscultation bilaterally. Heart: S1, S2, with no murmurs rubs or gallops. Abdomen: Obese, positive bowel sounds, soft, nontender, nondistended. Extremities: Right lower extremity with mild to moderate erythema starting at from below her knee to above her ankle, but noticeably less erythema compared to yesterday. Based on the markings, there is more regression of erythema. Her right leg is globally edematous, nonpitting at 1+ to 1-1/2+, less than yesterday.. Mild tenderness to palpation. Plantar surfaces of both feet with extremely dry plaque-like lesions.   Lab Results: Basic Metabolic Panel:  Basename 02/17/12 0526 02/16/12 0515  NA 139 139  K 3.2* 3.3*  CL 106 107  CO2 25 24  GLUCOSE 99 100*  BUN 6 7  CREATININE 0.85 1.00  CALCIUM 8.8 8.8  MG -- --  PHOS -- --   Liver Function Tests: No results found for this basename: AST:2,ALT:2,ALKPHOS:2,BILITOT:2,PROT:2,ALBUMIN:2 in the last 72 hours No results found for this basename: LIPASE:2,AMYLASE:2 in the last 72 hours No results found for this basename: AMMONIA:2 in the last 72  hours CBC:  Basename 02/17/12 0526 02/16/12 0515 02/15/12 1510  WBC 7.3 13.1* --  NEUTROABS -- -- 13.9*  HGB 11.2* 11.3* --  HCT 34.1* 34.0* --  MCV 86.3 85.9 --  PLT 266 271 --   Cardiac Enzymes: No results found for this basename: CKTOTAL:3,CKMB:3,CKMBINDEX:3,TROPONINI:3 in the last 72 hours BNP:  Basename 02/17/12 0526  PROBNP 221.6*   D-Dimer: No results found for this basename: DDIMER:2 in the last 72 hours CBG: No results found for this basename: GLUCAP:6 in the last 72 hours Hemoglobin A1C: No results found for this basename: HGBA1C in the last 72 hours Fasting Lipid Panel: No results found for this basename: CHOL,HDL,LDLCALC,TRIG,CHOLHDL,LDLDIRECT in the last 72 hours Thyroid Function Tests: No results found for this basename: TSH,T4TOTAL,FREET4,T3FREE,THYROIDAB in the last 72 hours Anemia Panel: No results found for this basename: VITAMINB12,FOLATE,FERRITIN,TIBC,IRON,RETICCTPCT in the last 72 hours Coagulation: No results found for this basename: LABPROT:2,INR:2 in the last 72 hours Urine Drug Screen: Drugs of Abuse  No results found for this basename: labopia,  cocainscrnur,  labbenz,  amphetmu,  thcu,  labbarb    Alcohol Level: No results found for this basename: ETH:2 in the last 72 hours Urinalysis: No results found for this basename: COLORURINE:2,APPERANCEUR:2,LABSPEC:2,PHURINE:2,GLUCOSEU:2,HGBUR:2,BILIRUBINUR:2,KETONESUR:2,PROTEINUR:2,UROBILINOGEN:2,NITRITE:2,LEUKOCYTESUR:2 in the last 72 hours Misc. Labs:   Micro: Recent Results (from the past 240 hour(s))  CULTURE, BLOOD (ROUTINE X 2)  Status: Normal (Preliminary result)   Collection Time   02/14/12 11:19 PM      Component Value Range Status Comment   Specimen Description BLOOD RIGHT ANTECUBITAL   Final    Special Requests BOTTLES DRAWN AEROBIC AND ANAEROBIC 6CC   Final    Culture NO GROWTH 3 DAYS   Final    Report Status PENDING   Incomplete   CULTURE, BLOOD (ROUTINE X 2)     Status: Normal  (Preliminary result)   Collection Time   02/14/12 11:22 PM      Component Value Range Status Comment   Specimen Description BLOOD LEFT HAND   Final    Special Requests BOTTLES DRAWN AEROBIC ONLY 6CC   Final    Culture NO GROWTH 3 DAYS   Final    Report Status PENDING   Incomplete     Studies/Results: No results found.  Medications:  Scheduled:    . calamine   Topical BID  . enoxaparin (LOVENOX) injection  40 mg Subcutaneous Q24H  . fluconazole  200 mg Oral Once  . potassium chloride  30 mEq Oral BID  . sodium chloride      . vancomycin  1,500 mg Intravenous Q12H  . DISCONTD: terbinafine   Topical Daily   Continuous:  ZOX:[id number], acetaminophen, diphenhydrAMINE, morphine injection, ondansetron (ZOFRAN) IV, ondansetron, oxyCODONE  Assessment: Active Problems:  Cellulitis and abscess of leg, except foot  Obesity  Hypokalemia  Dermatitis   1. Right lower extremity cellulitis. She is afebrile so far today. Her white blood cell count has normalized. We'll continue Zosyn and vancomycin. She was given Diflucan and topical Lamisil once yesterday. It is unclear if the Lamisil caused the itching. I'm inclined to believe that it did not, but nevertheless, it will not be restarted for now. The patient was told that she had excessively dry skin by her dermatologist.  Hypokalemia. We'll continue to replete. Hypokalemia may be secondary to the hypotonic fluids with the IV antibiotics.  Mild anemia, in part dilutional.    Plan:  1. Continue IV antibiotics. 2. Replete/supplement potassium chloride. We'll order magnesium level to rule out deficiency. 3. Encourage ambulation. 4. Possible discharge tomorrow.    LOS: 3 days   Carrie Mitchell 02/17/2012, 11:23 AM

## 2012-02-17 NOTE — Evaluation (Signed)
Physical Therapy Evaluation Patient Details Name: Carrie Mitchell MRN: [id number] DOB: 23-Jul-1970 Today's Date: 02/17/2012 Time:  1118- 1122    PT Assessment / Plan / Recommendation    No skilled therapy needed  Screen only no eval completed   Visit Information  Last PT Received On: 02/17/12    Subjective Data   Pt states she is moving fine.  States she has no difficulties getting out of bed and has been walking around her room I without difficulty.  Pt sees no need for skilled care.       RUSSELL,CINDY 02/17/2012, 11:31 AM

## 2012-02-18 LAB — BASIC METABOLIC PANEL
BUN: 5 mg/dL — ABNORMAL LOW (ref 6–23)
Calcium: 8.9 mg/dL (ref 8.4–10.5)
GFR calc Af Amer: >90 mL/min (ref >90)
GFR calc non Af Amer: 84 mL/min — ABNORMAL LOW (ref >90)
Glucose, Bld: 101 mg/dL — ABNORMAL HIGH (ref 70–99)
Potassium: 3.8 mEq/L (ref 3.5–5.1)
Sodium: 140 mEq/L (ref 135–145)

## 2012-02-18 MED ORDER — VITAMINS A & D EX OINT: TOPICAL_OINTMENT | CUTANEOUS | Status: AC | PRN

## 2012-02-18 MED ORDER — DOXYCYCLINE HYCLATE 100 MG PO TABS: 100.0000 mg | ORAL_TABLET | Freq: Two times a day (BID) | ORAL | Status: AC

## 2012-02-18 NOTE — Progress Notes (Signed)
Discharge instructions reviewed with patient, patient voiced understanding. Patient given discharge instructions, prescriptions, work note, and FLMA form completed by MD. Patient in stable condition and transported out by tech.

## 2012-02-18 NOTE — Discharge Summary (Signed)
Physician Discharge Summary  Carrie Mitchell ZOX:[id number] DOB: 1971/03/12 DOA: 02/14/2012  PCP: Terie Purser, PA  Admit date: 02/14/2012 Discharge date: 02/18/2012  Recommendations for Outpatient Follow-up:  1. The patient was discharged to home in improved condition. She will followup with her PA, Ms. Jean Rosenthal as scheduled tomorrow. She will return to work on 02/23/2012.  Discharge Diagnoses:  1. Cellulitis of the right leg. 2. Eczema or nonspecific dermatitis with excessively dry skin and scaliness on the plantar surfaces of both feet. 3. Hypokalemia. 4. Mild dilutional anemia.   Discharge Condition: Improved.  Diet recommendation: Heart healthy.  Filed Weights   02/14/12 8119 02/17/12 0510  Weight: 141.976 kg (313 lb) 143.246 kg (315 lb 12.8 oz)    History of present illness:  The patient is a 41 year old woman with a history significant for dry skin, particularly on the plantar surfaces of her feet, who presented to the emergency department on 02/14/2012 with a chief complaint of right leg pain, swelling, and redness. In the emergency department, she was febrile with temperature 100.9. Otherwise, she was hemodynamically stable. Her serum potassium was 3.3 and her venous glucose was 151. Her white blood cell count was 18.7. She was admitted for further evaluation and management.   Hospital Course:  She was given IV vancomycin in the emergency department. She was continued on antibiotic treatment with IV vancomycin. She was repleted/supplement her with potassium chloride orally. Her pain was treated with as needed IV and oral analgesics. She was noted to have excessively dry and scaly plantar surfaces. When questioned, she had been evaluated by a dermatologist in the past. She was told that she had excessively dry skin. She was treated with a number of topical steroids and a more units, but none helped. She was started on calamine lotion to be applied to her plantar surfaces during  hospitalization. She was also given 1 dose of oral Diflucan and topical Lamisil for a possible superimposed fungal infection. She developed some itching following the Lamisil cream, and therefore, it was discontinued. The patient stated that she had itching prior to the Lamisil, but nevertheless, it was discontinued. For further evaluation, a number of studies were ordered. Her venous glucose normalized to 100. Blood cultures remained negative at the time of discharge, but the final results were pending. Right lower extremity venous ultrasound was negative for DVT.  She improved clinically and symptomatically after several days. Her white blood cell count normalized to 7.3. Her serum potassium normalized to 3.8. She became afebrile and remained afebrile during the majority of the hospitalization. The extent of the right leg edema, erythema, and tenderness subsided substantially.  She was discharged to home on 5 more days of doxycycline. She was also prescribed A and D ointment to apply to her plantar surfaces on a  regular basis as a barrier to bacteria.  Procedures:  None  Consultations:  None  Discharge Exam: Filed Vitals:   02/18/12 0505  BP: 134/89  Pulse: 80  Temp: 98.2 F (36.8 C)  Resp: 19   Filed Vitals:   02/17/12 0510 02/17/12 1449 02/17/12 2051 02/18/12 0505  BP: 115/76 126/89 131/89 134/89  Pulse: 72 79 78 80  Temp: 98.3 F (36.8 C) 98.7 F (37.1 C) 98.8 F (37.1 C) 98.2 F (36.8 C)  TempSrc: Oral Oral Oral Oral  Resp: 18 18 18 19   Height:      Weight: 143.246 kg (315 lb 12.8 oz)     SpO2: 99% 100% 97% 94%  General: Alert and in no acute distress. Cardiovascular: S1, S2, with no murmurs rubs or gallops. Respiratory: Clear to auscultation bilaterally. Extremities: Significant decrease in erythema, edema, warmth, and tenderness of the right leg. Only a scant amount of erythema remains.  Discharge Instructions  Discharge Orders    Future Orders Please Complete  By Expires   Diet - low sodium heart healthy      Increase activity slowly      Discharge instructions      Comments:   Apply A and D. ointment to the soles of your feet as a barrier to bacteria. Keep leg elevated when you're sitting.     Medication List  As of 02/18/2012 11:32 AM   STOP taking these medications         Aspirin-Caffeine 845-65 MG Pack         TAKE these medications         doxycycline 100 MG tablet   Commonly known as: VIBRA-TABS   Take 1 tablet (100 mg total) by mouth 2 (two) times daily.      ibuprofen 200 MG tablet   Commonly known as: ADVIL,MOTRIN   Take 400 mg by mouth every 8 (eight) hours as needed. Aches and pain      vitamin A & D ointment   Apply topically as needed for dry skin.           Follow-up Information    Follow up with [facility]. (Follow up as scheduled.)    Contact information:   [address] [place] [address] [phone]           The results of significant diagnostics from this hospitalization (including imaging, microbiology, ancillary and laboratory) are listed below for reference.    Significant Diagnostic Studies: US Venous Img Lower Unilateral Right  02/14/2012  *RADIOLOGY REPORT*  Clinical Data: Cellulitis and right thigh pain.  RIGHT LOWER EXTREMITY VENOUS DUPLEX ULTRASOUND  Technique:  Gray-scale sonography with graded compression, as well as color Doppler and duplex ultrasound were performed to evaluate the deep venous system of the lower extremity from the level of the common femoral vein through the popliteal and proximal calf veins. Spectral Doppler was utilized to evaluate flow at rest and with distal augmentation maneuvers.  Comparison:  None.  Findings:  Normal compressibility of the common femoral, superficial femoral, and popliteal veins is demonstrated, as well as the visualized proximal calf veins.  No filling defects to suggest DVT on grayscale or color Doppler imaging.   Doppler waveforms show normal direction of venous flow, normal respiratory phasicity and response to augmentation.  IMPRESSION: No evidence of right lower extremity deep vein thrombosis.   Original Report Authenticated By: Richarda Overlie, M.D.     Microbiology: Recent Results (from the past 240 hour(s))  CULTURE, BLOOD (ROUTINE X 2)     Status: Normal (Preliminary result)   Collection Time   02/14/12 11:19 PM      Component Value Range Status Comment   Specimen Description BLOOD RIGHT ANTECUBITAL   Final    Special Requests BOTTLES DRAWN AEROBIC AND ANAEROBIC 6CC   Final    Culture NO GROWTH 4 DAYS   Final    Report Status PENDING   Incomplete   CULTURE, BLOOD (ROUTINE X 2)     Status: Normal (Preliminary result)   Collection Time   02/14/12 11:22 PM      Component Value Range Status Comment   Specimen Description BLOOD LEFT HAND  Final    Special Requests BOTTLES DRAWN AEROBIC ONLY [facility]   Final    Culture NO GROWTH 4 DAYS   Final    Report Status PENDING   Incomplete      Labs: Basic Metabolic Panel:  Lab 02/18/12 4098 02/17/12 0526 02/16/12 0515 02/15/12 0523 02/14/12 0718  NA 140 139 139 -- 138  K 3.8 3.2* 3.3* -- 3.3*  CL 106 106 107 -- 101  CO2 25 25 24  -- 27  GLUCOSE 101* 99 100* -- 151*  BUN 5* 6 7 -- 8  CREATININE 0.85 0.85 1.00 1.16* 0.86  CALCIUM 8.9 8.8 8.8 -- 9.5  MG 1.9 1.9 -- -- --  PHOS -- -- -- -- --   Liver Function Tests: No results found for this basename: AST:5,ALT:5,ALKPHOS:5,BILITOT:5,PROT:5,ALBUMIN:5 in the last 168 hours No results found for this basename: LIPASE:5,AMYLASE:5 in the last 168 hours No results found for this basename: AMMONIA:5 in the last 168 hours CBC:  Lab 02/17/12 0526 02/16/12 0515 02/15/12 1510 02/15/12 0523 02/14/12 0718  WBC 7.3 13.1* 17.6* 18.7* 18.7*  NEUTROABS -- -- 13.9* -- --  HGB 11.2* 11.3* 11.4* 12.0 13.4  HCT 34.1* 34.0* 34.7* 36.3 40.7  MCV 86.3 85.9 86.1 87.3 85.9  PLT 266 271 273 231 327   Cardiac Enzymes: No  results found for this basename: CKTOTAL:5,CKMB:5,CKMBINDEX:5,TROPONINI:5 in the last 168 hours BNP: BNP (last 3 results)  Basename 02/17/12 0526  PROBNP 221.6*   CBG: No results found for this basename: GLUCAP:5 in the last 168 hours  Time coordinating discharge: Less than 30 minutes  Signed:  Alondra Sahni  [facility] 02/18/2012, 11:32 AM

## 2012-02-19 LAB — CULTURE, BLOOD (ROUTINE X 2)
Culture: NO GROWTH
Culture: NO GROWTH

## 2012-05-02 ENCOUNTER — Emergency Department (HOSPITAL_COMMUNITY): Payer: 59

## 2012-05-02 ENCOUNTER — Encounter (HOSPITAL_COMMUNITY): Payer: Self-pay | Admitting: *Deleted

## 2012-05-02 ENCOUNTER — Emergency Department (HOSPITAL_COMMUNITY)
Admission: EM | Admit: 2012-05-02 | Discharge: 2012-05-03 | Disposition: A | Discharge status: Home / Self Care | Payer: 59 | Attending: Emergency Medicine | Admitting: Emergency Medicine

## 2012-05-02 DIAGNOSIS — R0789 Other chest pain: Secondary | ICD-10-CM

## 2012-05-02 DIAGNOSIS — R071 Chest pain on breathing: Secondary | ICD-10-CM | POA: Insufficient documentation

## 2012-05-02 LAB — CBC WITH DIFFERENTIAL/PLATELET
Eosinophils Relative: 5 % (ref 0–5)
HCT: 37.9 % (ref 36.0–46.0)
Lymphocytes Relative: 34 % (ref 12–46)
Lymphs Abs: 2.4 10*3/uL (ref 0.7–4.0)
MCV: 85.4 fL (ref 78.0–100.0)
Monocytes Absolute: 0.5 10*3/uL (ref 0.1–1.0)
Monocytes Relative: 7 % (ref 3–12)
RBC: 4.44 MIL/uL (ref 3.87–5.11)
WBC: 7 10*3/uL (ref 4.0–10.5)

## 2012-05-02 MED ORDER — IBUPROFEN 800 MG PO TABS
800.0000 mg | ORAL_TABLET | Freq: Once | ORAL | Status: AC
  Administered 2012-05-02: 800 mg via ORAL
  Filled 2012-05-02: qty 1

## 2012-05-02 MED ORDER — HYDROCODONE-ACETAMINOPHEN 5-325 MG PO TABS
2.0000 | ORAL_TABLET | Freq: Once | ORAL | Status: AC
  Administered 2012-05-02: 2 via ORAL
  Filled 2012-05-02: qty 2

## 2012-05-02 NOTE — ED Provider Notes (Signed)
History   This chart was scribed for [employer]. Colon Branch, MD, by Marcina Millard scribe. The patient was seen in room APA09/APA09 and the patient's care was started at 2315.    CSN: [id number]  Arrival date & time 05/02/12  2251   First MD Initiated Contact with Patient 05/02/12 2315      Chief Complaint  Patient presents with  . Chest Pain    (Consider location/radiation/quality/duration/timing/severity/associated sxs/prior treatment) HPI Comments: Carrie Mitchell is a 41 y.o. female who presents to the Emergency Department complaining of  gradually worsening left chest pain that is aggravated with movement and palpation and began 2 days ago. She reports  associated nausea but no vomiting. She reports that she just started taking an antibiotic for cellulitis to her right leg.  PCP Dr. Jean Rosenthal         Past Medical History  Diagnosis Date  . Cellulitis   . Dermatitis 02/16/2012    Past Surgical History  Procedure Date  . Tubal ligation   . Breast surgery     History reviewed. No pertinent family history.  History  Substance Use Topics  . Smoking status: Never Smoker   . Smokeless tobacco: Not on file  . Alcohol Use: No    OB History    Grav Para Term Preterm Abortions TAB SAB Ect Mult Living                  Review of Systems  Constitutional: Negative for fever.       10 Systems reviewed and are negative for acute change except as noted in the HPI.  HENT: Negative for congestion.   Eyes: Negative for discharge and redness.  Respiratory: Negative for cough and shortness of breath.   Cardiovascular: Negative for chest pain.  Gastrointestinal: Negative for vomiting and abdominal pain.  Musculoskeletal: Negative for back pain.       Chest wall pain  Skin: Negative for rash.  Neurological: Negative for syncope, numbness and headaches.  Psychiatric/Behavioral:       No behavior change.   A complete 10 system review of systems was obtained and all systems  are negative except as noted in the HPI and PMH.   Allergies  Review of patient's allergies indicates no known allergies.  Home Medications   Current Outpatient Rx  Name  Route  Sig  Dispense  Refill  . IBUPROFEN 200 MG PO TABS   Oral   Take 400 mg by mouth every 8 (eight) hours as needed. Aches and pain         . VITAMINS A & D EX OINT   Topical   Apply topically as needed for dry skin.   45 g   0     BP 125/89  Pulse 77  Temp 97.8 F (36.6 C) (Oral)  Resp 20  Ht 5' 9.5" (1.765 m)  Wt 310 lb (140.615 kg)  BMI 45.12 kg/m2  SpO2 100%  Physical Exam  Nursing note and vitals reviewed. Constitutional: She is oriented to person, place, and time. She appears well-developed and well-nourished. No distress.       Awake, alert, nontoxic appearance.  HENT:  Head: Normocephalic and atraumatic.  Eyes: EOM are normal. Pupils are equal, round, and reactive to light. Right eye exhibits no discharge. Left eye exhibits no discharge.  Neck: Normal range of motion. Neck supple. No tracheal deviation present.  Cardiovascular: Normal rate.   Pulmonary/Chest: Effort normal. No respiratory distress. She exhibits no tenderness.  There is tenderness to the entire left side of her chest wall to palpation. No crepitus, no deformities.Large breasts.   Abdominal: Soft. She exhibits no distension. There is no tenderness. There is no rebound.  Musculoskeletal: Normal range of motion. She exhibits no tenderness.       She has ongoing celluitis that being treated to her right leg and a congenital deformity of the left hand.  Neurological: She is alert and oriented to person, place, and time.       Mental status and motor strength appears baseline for patient and situation.  Skin: Skin is warm and dry. No rash noted.       She has post inflammatory hyper pigmentation.     Psychiatric: She has a normal mood and affect. Her behavior is normal.    ED Course  Procedures (including critical  care time)  DIAGNOSTIC STUDIES: Oxygen Saturation is 100% on room air, normal by my interpretation.    COORDINATION OF CARE:  23:25- Discussed planned course of treatment with the patient, including a chest X-ray and blood work, who is agreeable at this time.  23:30- Medication Orders- ibuprofen (ADVIL, MOTRIN) tablet 800 mg- Once, HYDROcodone-acetaminophen (NORCO/VICODIN) 5-325mg  per tablet 2 tablet- Once.  Results for orders placed during the hospital encounter of 05/02/12  TROPONIN I      Component Value Range   Troponin I <0.30  <0.30 ng/mL  CBC WITH DIFFERENTIAL      Component Value Range   WBC 7.0  4.0 - 10.5 K/uL   RBC 4.44  3.87 - 5.11 MIL/uL   Hemoglobin 12.5  12.0 - 15.0 g/dL   HCT 16.1  09.6 - 04.5 %   MCV 85.4  78.0 - 100.0 fL   MCH 28.2  26.0 - 34.0 pg   MCHC 33.0  30.0 - 36.0 g/dL   RDW 40.9  81.1 - 91.4 %   Platelets 367  150 - 400 K/uL   Neutrophils Relative 54  43 - 77 %   Neutro Abs 3.8  1.7 - 7.7 K/uL   Lymphocytes Relative 34  12 - 46 %   Lymphs Abs 2.4  0.7 - 4.0 K/uL   Monocytes Relative 7  3 - 12 %   Monocytes Absolute 0.5  0.1 - 1.0 K/uL   Eosinophils Relative 5  0 - 5 %   Eosinophils Absolute 0.3  0.0 - 0.7 K/uL   Basophils Relative 0  0 - 1 %   Basophils Absolute 0.0  0.0 - 0.1 K/uL  COMPREHENSIVE METABOLIC PANEL      Component Value Range   Sodium 140  135 - 145 mEq/L   Potassium 3.3 (*) 3.5 - 5.1 mEq/L   Chloride 102  96 - 112 mEq/L   CO2 26  19 - 32 mEq/L   Glucose, Bld 109 (*) 70 - 99 mg/dL   BUN 10  6 - 23 mg/dL   Creatinine, Ser 7.82  0.50 - 1.10 mg/dL   Calcium 9.5  8.4 - 95.6 mg/dL   Total Protein 8.3  6.0 - 8.3 g/dL   Albumin 3.6  3.5 - 5.2 g/dL   AST 17  0 - 37 U/L   ALT 17  0 - 35 U/L   Alkaline Phosphatase 80  39 - 117 U/L   Total Bilirubin 0.2 (*) 0.3 - 1.2 mg/dL   GFR calc non Af Amer 81 (*) >90 mL/min   GFR calc Af Amer >90  >90 mL/min  Dg Chest 2 View  05/03/2012  *RADIOLOGY REPORT*  Clinical Data: Chest pain  CHEST  - 2 VIEW  Comparison: None  Findings: The heart size and mediastinal contours are within normal limits.  Both lungs are clear.  The visualized skeletal structures are unremarkable.  IMPRESSION: Negative exam.   Original Report Authenticated By: Signa Kell, M.D.    Dg Chest Portable 1 View  05/02/2012  *RADIOLOGY REPORT*  Clinical Data: Chest pain for several days  PORTABLE CHEST - 1 VIEW  Comparison: None.  Findings: Heart size is normal.  No pleural effusion or edema.  No airspace consolidation.  Increased opacity within the right paratracheal region is indeterminate. This may be related to portable technique.  The visualized osseous structures are unremarkable.  IMPRESSION:  1.  No evidence for pneumonia. 2.  Right paratracheal opacity is indeterminate.  Recommend repeat imaging in the PA and lateral orientation as patient's clinical condition tolerates.   Original Report Authenticated By: Signa Kell, M.D.       Date: 05/03/2012  2300  Rate: 76  Rhythm: normal sinus rhythm  QRS Axis: normal  Intervals: normal  ST/T Wave abnormalities: normal  Conduction Disutrbances: none  Narrative Interpretation: unremarkable        MDM  Patient with left chest wall tenderness. Given analgesic and antiinflammatory with improvement. Chest xray negative for acute process. Labs unremarkable. Dx testing d/w pt.  Questions answered.  Verb understanding, agreeable to d/c home with outpt f/u. Pt feels improved after observation and/or treatment in ED.Pt stable in ED with no significant deterioration in condition.The patient appears reasonably screened and/or stabilized for discharge and I doubt any other medical condition or other [facility] requiring further screening, evaluation, or treatment in the ED at this time prior to discharge.  I personally performed the services described in this documentation, which was scribed in my presence. The recorded information has been reviewed and  considered.   MDM Reviewed: nursing note and vitals Interpretation: labs, ECG and x-ray            Nicoletta Dress. Colon Branch, MD 05/03/12 [phone]

## 2012-05-02 NOTE — ED Notes (Signed)
Pt with left chest pressure x 2 days but has gotten worse, +N/V, admits to taking an antibiotic for cellulitis to leg

## 2012-05-02 NOTE — ED Notes (Addendum)
Pt states left sided chest pain over several days. States pain is in left side of chest, states painful to palpation. Pt appears visibly in pain at this time. Pt AAx4 at this time, NSR showing on monitor, EKG completed and given to Dr strand

## 2012-05-02 NOTE — ED Notes (Signed)
Pt ambulated to bathroom with assistance.

## 2012-05-03 ENCOUNTER — Emergency Department (HOSPITAL_COMMUNITY): Payer: 59

## 2012-05-03 LAB — COMPREHENSIVE METABOLIC PANEL
ALT: 17 U/L (ref 0–35)
BUN: 10 mg/dL (ref 6–23)
CO2: 26 mEq/L (ref 19–32)
Calcium: 9.5 mg/dL (ref 8.4–10.5)
Creatinine, Ser: 0.87 mg/dL (ref 0.50–1.10)
GFR calc Af Amer: >90 mL/min (ref >90)
GFR calc non Af Amer: 81 mL/min — ABNORMAL LOW (ref >90)
Glucose, Bld: 109 mg/dL — ABNORMAL HIGH (ref 70–99)

## 2012-05-03 MED ORDER — HYDROCODONE-ACETAMINOPHEN 5-325 MG PO TABS: 1.0000 | ORAL_TABLET | ORAL | Status: AC | PRN

## 2012-05-03 MED ORDER — IBUPROFEN 800 MG PO TABS: 800.0000 mg | ORAL_TABLET | Freq: Three times a day (TID) | ORAL | Status: DC

## 2013-03-17 ENCOUNTER — Encounter (HOSPITAL_COMMUNITY): Payer: Self-pay

## 2013-03-17 ENCOUNTER — Inpatient Hospital Stay (HOSPITAL_COMMUNITY): Payer: 59

## 2013-03-17 ENCOUNTER — Inpatient Hospital Stay (HOSPITAL_COMMUNITY)
Admission: EM | Admit: 2013-03-17 | Discharge: 2013-03-20 | DRG: 854 | Disposition: A | Discharge status: Home / Self Care | Payer: 59 | Attending: Internal Medicine | Admitting: Internal Medicine

## 2013-03-17 ENCOUNTER — Emergency Department (HOSPITAL_COMMUNITY): Payer: 59

## 2013-03-17 DIAGNOSIS — L03115 Cellulitis of right lower limb: Secondary | ICD-10-CM | POA: Diagnosis present

## 2013-03-17 DIAGNOSIS — N289 Disorder of kidney and ureter, unspecified: Secondary | ICD-10-CM | POA: Diagnosis not present

## 2013-03-17 DIAGNOSIS — R1011 Right upper quadrant pain: Secondary | ICD-10-CM | POA: Diagnosis present

## 2013-03-17 DIAGNOSIS — K8020 Calculus of gallbladder without cholecystitis without obstruction: Secondary | ICD-10-CM | POA: Diagnosis present

## 2013-03-17 DIAGNOSIS — R112 Nausea with vomiting, unspecified: Secondary | ICD-10-CM

## 2013-03-17 DIAGNOSIS — E876 Hypokalemia: Secondary | ICD-10-CM | POA: Diagnosis not present

## 2013-03-17 DIAGNOSIS — R079 Chest pain, unspecified: Secondary | ICD-10-CM

## 2013-03-17 DIAGNOSIS — K8000 Calculus of gallbladder with acute cholecystitis without obstruction: Secondary | ICD-10-CM | POA: Diagnosis present

## 2013-03-17 DIAGNOSIS — L02419 Cutaneous abscess of limb, unspecified: Secondary | ICD-10-CM

## 2013-03-17 DIAGNOSIS — R9431 Abnormal electrocardiogram [ECG] [EKG]: Secondary | ICD-10-CM

## 2013-03-17 DIAGNOSIS — E669 Obesity, unspecified: Secondary | ICD-10-CM | POA: Diagnosis present

## 2013-03-17 DIAGNOSIS — L03119 Cellulitis of unspecified part of limb: Secondary | ICD-10-CM

## 2013-03-17 DIAGNOSIS — Z6841 Body Mass Index (BMI) 40.0 and over, adult: Secondary | ICD-10-CM

## 2013-03-17 DIAGNOSIS — A419 Sepsis, unspecified organism: Secondary | ICD-10-CM

## 2013-03-17 DIAGNOSIS — L259 Unspecified contact dermatitis, unspecified cause: Secondary | ICD-10-CM | POA: Diagnosis present

## 2013-03-17 DIAGNOSIS — I517 Cardiomegaly: Secondary | ICD-10-CM

## 2013-03-17 HISTORY — DX: Calculus of gallbladder without cholecystitis without obstruction: K80.20

## 2013-03-17 LAB — CBC WITH DIFFERENTIAL/PLATELET
Basophils Absolute: 0 10*3/uL (ref 0.0–0.1)
Basophils Relative: 0 % (ref 0–1)
Eosinophils Absolute: 0 10*3/uL (ref 0.0–0.7)
Eosinophils Relative: 0 % (ref 0–5)
HCT: 40.9 % (ref 36.0–46.0)
Hemoglobin: 13.6 g/dL (ref 12.0–15.0)
Lymphocytes Relative: 5 % — ABNORMAL LOW (ref 12–46)
Lymphs Abs: 1 10*3/uL (ref 0.7–4.0)
MCH: 28.7 pg (ref 26.0–34.0)
MCHC: 33.3 g/dL (ref 30.0–36.0)
MCV: 86.3 fL (ref 78.0–100.0)
Monocytes Absolute: 0.9 10*3/uL (ref 0.1–1.0)
Monocytes Relative: 4 % (ref 3–12)
Neutro Abs: 18.9 10*3/uL — ABNORMAL HIGH (ref 1.7–7.7)
Neutrophils Relative %: 91 % — ABNORMAL HIGH (ref 43–77)
Platelets: 359 10*3/uL (ref 150–400)
RBC: 4.74 MIL/uL (ref 3.87–5.11)
RDW: 14.4 % (ref 11.5–15.5)
WBC: 20.8 10*3/uL — ABNORMAL HIGH (ref 4.0–10.5)

## 2013-03-17 LAB — BASIC METABOLIC PANEL
BUN: 9 mg/dL (ref 6–23)
CO2: 25 mEq/L (ref 19–32)
Calcium: 9.7 mg/dL (ref 8.4–10.5)
Chloride: 99 mEq/L (ref 96–112)
Creatinine, Ser: 0.96 mg/dL (ref 0.50–1.10)
GFR calc Af Amer: 83 mL/min — ABNORMAL LOW (ref >90)
GFR calc non Af Amer: 72 mL/min — ABNORMAL LOW (ref >90)
Glucose, Bld: 119 mg/dL — ABNORMAL HIGH (ref 70–99)
Potassium: 3.4 mEq/L — ABNORMAL LOW (ref 3.5–5.1)
Sodium: 137 mEq/L (ref 135–145)

## 2013-03-17 LAB — HEPATIC FUNCTION PANEL
ALT: 27 U/L (ref 0–35)
AST: 23 U/L (ref 0–37)
Albumin: 3.8 g/dL (ref 3.5–5.2)
Alkaline Phosphatase: 95 U/L (ref 39–117)
Bilirubin, Direct: <0.1 mg/dL (ref 0.0–0.3)
Total Bilirubin: 0.6 mg/dL (ref 0.3–1.2)

## 2013-03-17 LAB — URINE MICROSCOPIC-ADD ON

## 2013-03-17 LAB — URINALYSIS, ROUTINE W REFLEX MICROSCOPIC
Bilirubin Urine: NEGATIVE
Glucose, UA: NEGATIVE mg/dL
Ketones, ur: NEGATIVE mg/dL
Leukocytes, UA: NEGATIVE
Nitrite: NEGATIVE
Protein, ur: NEGATIVE mg/dL
Specific Gravity, Urine: >1.03 — ABNORMAL HIGH (ref 1.005–1.030)
Urobilinogen, UA: 0.2 mg/dL (ref 0.0–1.0)
pH: 5.5 (ref 5.0–8.0)

## 2013-03-17 LAB — TROPONIN I
Troponin I: <0.3 ng/mL (ref <0.30)
Troponin I: <0.3 ng/mL (ref <0.30)

## 2013-03-17 LAB — LIPASE, BLOOD: Lipase: 12 U/L (ref 11–59)

## 2013-03-17 LAB — CBC
MCH: 28.6 pg (ref 26.0–34.0)
MCHC: 33.1 g/dL (ref 30.0–36.0)
MCV: 86.5 fL (ref 78.0–100.0)
Platelets: 340 10*3/uL (ref 150–400)
RBC: 4.37 MIL/uL (ref 3.87–5.11)

## 2013-03-17 LAB — LACTIC ACID, PLASMA
Lactic Acid, Venous: 2.3 mmol/L — ABNORMAL HIGH (ref 0.5–2.2)
Lactic Acid, Venous: 0.7 mmol/L (ref 0.5–2.2)

## 2013-03-17 LAB — CREATININE, SERUM
Creatinine, Ser: 1.08 mg/dL (ref 0.50–1.10)
GFR calc non Af Amer: 62 mL/min — ABNORMAL LOW (ref >90)

## 2013-03-17 MED ORDER — ACETAMINOPHEN 325 MG PO TABS
650.0000 mg | ORAL_TABLET | Freq: Four times a day (QID) | ORAL | Status: DC | PRN
  Administered 2013-03-17 – 2013-03-18 (×3): 650 mg via ORAL
  Filled 2013-03-17 (×3): qty 2

## 2013-03-17 MED ORDER — HYDROMORPHONE HCL PF 1 MG/ML IJ SOLN
1.0000 mg | INTRAMUSCULAR | Status: DC | PRN
  Administered 2013-03-17 (×4): 1 mg via INTRAVENOUS
  Filled 2013-03-17 (×4): qty 1

## 2013-03-17 MED ORDER — MORPHINE SULFATE 4 MG/ML IJ SOLN
6.0000 mg | Freq: Once | INTRAMUSCULAR | Status: AC
  Administered 2013-03-17: 6 mg via INTRAVENOUS
  Filled 2013-03-17: qty 2

## 2013-03-17 MED ORDER — POTASSIUM CHLORIDE CRYS ER 20 MEQ PO TBCR
40.0000 meq | EXTENDED_RELEASE_TABLET | Freq: Once | ORAL | Status: AC
  Administered 2013-03-17: 40 meq via ORAL
  Filled 2013-03-17: qty 2

## 2013-03-17 MED ORDER — VANCOMYCIN HCL IN DEXTROSE 1-5 GM/200ML-% IV SOLN
1000.0000 mg | Freq: Once | INTRAVENOUS | Status: AC
  Administered 2013-03-17: 1000 mg via INTRAVENOUS
  Filled 2013-03-17: qty 200

## 2013-03-17 MED ORDER — PANTOPRAZOLE SODIUM 40 MG IV SOLR
40.0000 mg | Freq: Two times a day (BID) | INTRAVENOUS | Status: DC
  Administered 2013-03-17 – 2013-03-19 (×6): 40 mg via INTRAVENOUS
  Filled 2013-03-17 (×6): qty 40

## 2013-03-17 MED ORDER — SODIUM CHLORIDE 0.9 % IV BOLUS (SEPSIS)
1000.0000 mL | Freq: Once | INTRAVENOUS | Status: AC
  Administered 2013-03-17: 1000 mL via INTRAVENOUS

## 2013-03-17 MED ORDER — SODIUM CHLORIDE 0.9 % IV SOLN
INTRAVENOUS | Status: DC
  Administered 2013-03-17: 20:00:00 via INTRAVENOUS
  Administered 2013-03-17: 1000 mL via INTRAVENOUS

## 2013-03-17 MED ORDER — GI COCKTAIL ~~LOC~~
30.0000 mL | Freq: Once | ORAL | Status: AC
  Administered 2013-03-17: 30 mL via ORAL
  Filled 2013-03-17: qty 30

## 2013-03-17 MED ORDER — ONDANSETRON HCL 4 MG/2ML IJ SOLN
4.0000 mg | Freq: Once | INTRAMUSCULAR | Status: AC
  Administered 2013-03-17: 4 mg via INTRAVENOUS
  Filled 2013-03-17: qty 2

## 2013-03-17 MED ORDER — ENOXAPARIN SODIUM 40 MG/0.4ML ~~LOC~~ SOLN
40.0000 mg | SUBCUTANEOUS | Status: DC
  Administered 2013-03-17 – 2013-03-20 (×4): 40 mg via SUBCUTANEOUS
  Filled 2013-03-17 (×5): qty 0.4

## 2013-03-17 MED ORDER — ONDANSETRON HCL 4 MG/2ML IJ SOLN: 4.0000 mg | Freq: Four times a day (QID) | INTRAMUSCULAR | Status: DC | PRN

## 2013-03-17 MED ORDER — ACETAMINOPHEN 650 MG RE SUPP: 650.0000 mg | Freq: Four times a day (QID) | RECTAL | Status: DC | PRN

## 2013-03-17 MED ORDER — ONDANSETRON HCL 4 MG PO TABS: 4.0000 mg | ORAL_TABLET | Freq: Four times a day (QID) | ORAL | Status: DC | PRN

## 2013-03-17 MED ORDER — MORPHINE SULFATE 4 MG/ML IJ SOLN
4.0000 mg | Freq: Once | INTRAMUSCULAR | Status: AC
  Administered 2013-03-17: 4 mg via INTRAVENOUS
  Filled 2013-03-17: qty 1

## 2013-03-17 MED ORDER — VANCOMYCIN HCL IN DEXTROSE 1-5 GM/200ML-% IV SOLN
1000.0000 mg | Freq: Three times a day (TID) | INTRAVENOUS | Status: DC
  Administered 2013-03-17 – 2013-03-18 (×3): 1000 mg via INTRAVENOUS
  Filled 2013-03-17 (×10): qty 200

## 2013-03-17 MED ORDER — VANCOMYCIN HCL IN DEXTROSE 1-5 GM/200ML-% IV SOLN
INTRAVENOUS | Status: AC
  Filled 2013-03-17: qty 200

## 2013-03-17 MED ORDER — ASPIRIN 81 MG PO CHEW
324.0000 mg | CHEWABLE_TABLET | Freq: Once | ORAL | Status: AC
  Administered 2013-03-17: 324 mg via ORAL
  Filled 2013-03-17: qty 4

## 2013-03-17 MED ORDER — IOHEXOL 350 MG/ML SOLN
100.0000 mL | Freq: Once | INTRAVENOUS | Status: AC | PRN
  Administered 2013-03-17: 100 mL via INTRAVENOUS

## 2013-03-17 MED ORDER — SODIUM CHLORIDE 0.9 % IJ SOLN
10.0000 mL | INTRAMUSCULAR | Status: DC | PRN
  Administered 2013-03-17: 30 mL

## 2013-03-17 MED ORDER — ACETAMINOPHEN 325 MG PO TABS
650.0000 mg | ORAL_TABLET | Freq: Once | ORAL | Status: AC
  Administered 2013-03-17: 650 mg via ORAL
  Filled 2013-03-17: qty 2

## 2013-03-17 MED ORDER — PIPERACILLIN-TAZOBACTAM 3.375 G IVPB
3.3750 g | Freq: Three times a day (TID) | INTRAVENOUS | Status: DC
  Administered 2013-03-17 – 2013-03-20 (×9): 3.375 g via INTRAVENOUS
  Filled 2013-03-17 (×10): qty 50

## 2013-03-17 MED ORDER — SODIUM CHLORIDE 0.9 % IV SOLN: INTRAVENOUS | Status: DC

## 2013-03-17 NOTE — Progress Notes (Signed)
*  PRELIMINARY RESULTS* Echocardiogram 2D Echocardiogram has been performed.  Damani Rando 03/17/2013, 10:16 AM

## 2013-03-17 NOTE — Progress Notes (Signed)
Notified Dr. Sherrie Mustache to ask her about the need for the double lumen PICC vs the single lumen PICC.  Megan from the [facility] team stated that if she goes home with the PICC the double lumen will have to be converted back to a single lumen.  According to Dr. Sherrie Mustache she states that she does not anticipate the patient going home on the antibiotics. She would like for the patient to continue to get the double lumen.  I voiced the discussion with Megan.  She states that she would talk to me once she gets to the floor.

## 2013-03-17 NOTE — H&P (Signed)
PCP:   Pershing Proud   Chief Complaint:  Right leg pain and chest pain  HPI: 42 year old female with a history of obesity, dermatitis came to the ED with complaints of fever, right leg and chest pain which started around 7 PM last night. Patient also complained of myalgias. As per patient, the onset of the pain was sudden. Regarding chest pain, it is located under the left breast, at this time it is down to 5 / 10 in intensity, first set of cardiac enzymes is negative EKG shows T-wave inversions in the lateral and the inferior leads. Patient does not have a history of CAD. No history of DVT in the past. The pain is associated with nausea and vomiting. No history of gallstones, denies alcohol abuse. She also admits to having some dysuria. In the ED patient was found to have elevated white count, fever and has been given vancomycin for cellulitis. Pain in the right leg is 7 / 10 in intensity.  Allergies:  No Known Allergies    Past Medical History  Diagnosis Date  . Cellulitis   . Dermatitis 02/16/2012    Past Surgical History  Procedure Laterality Date  . Tubal ligation    . Breast surgery      Prior to Admission medications   Medication Sig Start Date End Date Taking? Authorizing Provider  ibuprofen (ADVIL,MOTRIN) 200 MG tablet Take 400 mg by mouth every 8 (eight) hours as needed. Aches and pain    Historical Provider, MD  ibuprofen (ADVIL,MOTRIN) 800 MG tablet Take 1 tablet (800 mg total) by mouth 3 (three) times daily. 05/03/12   Nicoletta Dress. Colon Branch, MD    Social History:  reports that she has never smoked. She does not have any smokeless tobacco history on file. She reports that she does not drink alcohol or use illicit drugs.   All the positives are listed in BOLD  Review of Systems:  HEENT: Headache, blurred vision, runny nose, sore throat Neck: Hypothyroidism, hyperthyroidism,,lymphadenopathy Chest : Shortness of breath, history of COPD, Asthma Heart : Chest pain,  history of coronary arterey disease GI:  Nausea, vomiting, diarrhea, constipation, GERD GU: Dysuria, urgency, frequency of urination, hematuria Neuro: Stroke, seizures, syncope Psych: Depression, anxiety, hallucinations   Physical Exam: Blood pressure 135/67, pulse 115, temperature 99.5 F (37.5 C), temperature source Oral, resp. rate 14, height 5' 9.5" (1.765 m), weight 142.883 kg (315 lb), SpO2 95.00%. Constitutional:   Patient is a well-developed morbidly obese female in no acute distress and cooperative with exam. Head: Normocephalic and atraumatic Mouth: Mucus membranes moist Eyes: PERRL, EOMI, conjunctivae normal Neck: Supple, No Thyromegaly Cardiovascular: RRR, S1 normal, S2 normal Pulmonary/Chest: CTAB, no wheezes, rales, or rhonchi Abdominal: Soft. Mild tenderness to palpation in the epigastric region, non-distended, bowel sounds are normal, no masses, organomegaly, or guarding present.  Neurological: A&O x3, Strenght is normal and symmetric bilaterally, cranial nerve II-XII are grossly intact, no focal motor deficit, sensory intact to light touch bilaterally.  Extremities : Right lower extremity is erythematous and warm to touch. Positive tender to palpation, trace edema in the both lower extremities right more the left.   Labs on Admission:  Results for orders placed during the hospital encounter of 03/17/13 (from the past 48 hour(s))  TROPONIN I     Status: None   Collection Time    03/17/13  1:30 AM      Result Value Range   Troponin I <0.30  <0.30 ng/mL   Comment:  Due to the release kinetics of cTnI,     a negative result within the first hours     of the onset of symptoms does not rule out     myocardial infarction with certainty.     If myocardial infarction is still suspected,     repeat the test at appropriate intervals.  CBC WITH DIFFERENTIAL     Status: Abnormal   Collection Time    03/17/13  1:30 AM      Result Value Range   WBC 20.8 (*) 4.0 -  10.5 K/uL   RBC 4.74  3.87 - 5.11 MIL/uL   Hemoglobin 13.6  12.0 - 15.0 g/dL   HCT 96.0  45.4 - 09.8 %   MCV 86.3  78.0 - 100.0 fL   MCH 28.7  26.0 - 34.0 pg   MCHC 33.3  30.0 - 36.0 g/dL   RDW 11.9  14.7 - 82.9 %   Platelets 359  150 - 400 K/uL   Neutrophils Relative % 91 (*) 43 - 77 %   Neutro Abs 18.9 (*) 1.7 - 7.7 K/uL   Lymphocytes Relative 5 (*) 12 - 46 %   Lymphs Abs 1.0  0.7 - 4.0 K/uL   Monocytes Relative 4  3 - 12 %   Monocytes Absolute 0.9  0.1 - 1.0 K/uL   Eosinophils Relative 0  0 - 5 %   Eosinophils Absolute 0.0  0.0 - 0.7 K/uL   Basophils Relative 0  0 - 1 %   Basophils Absolute 0.0  0.0 - 0.1 K/uL  BASIC METABOLIC PANEL     Status: Abnormal   Collection Time    03/17/13  1:30 AM      Result Value Range   Sodium 137  135 - 145 mEq/L   Potassium 3.4 (*) 3.5 - 5.1 mEq/L   Chloride 99  96 - 112 mEq/L   CO2 25  19 - 32 mEq/L   Glucose, Bld 119 (*) 70 - 99 mg/dL   BUN 9  6 - 23 mg/dL   Creatinine, Ser 5.62  0.50 - 1.10 mg/dL   Calcium 9.7  8.4 - 13.0 mg/dL   GFR calc non Af Amer 72 (*) >90 mL/min   GFR calc Af Amer 83 (*) >90 mL/min   Comment: (NOTE)     The eGFR has been calculated using the CKD EPI equation.     This calculation has not been validated in all clinical situations.     eGFR's persistently <90 mL/min signify possible Chronic Kidney     Disease.  CULTURE, BLOOD (ROUTINE X 2)     Status: None   Collection Time    03/17/13  2:03 AM      Result Value Range   Specimen Description BLOOD RIGHT ANTECUBITAL     Special Requests       Value: BOTTLES DRAWN AEROBIC AND ANAEROBIC AEB 11CC ANA 8CC   Culture PENDING     Report Status PENDING    LACTIC ACID, PLASMA     Status: Abnormal   Collection Time    03/17/13  2:03 AM      Result Value Range   Lactic Acid, Venous 2.3 (*) 0.5 - 2.2 mmol/L  CULTURE, BLOOD (ROUTINE X 2)     Status: None   Collection Time    03/17/13  2:04 AM      Result Value Range   Specimen Description BLOOD RIGHT ANTECUBITAL  Special Requests BOTTLES DRAWN AEROBIC ONLY 10CC     Culture PENDING     Report Status PENDING    URINALYSIS, ROUTINE W REFLEX MICROSCOPIC     Status: Abnormal   Collection Time    03/17/13  2:27 AM      Result Value Range   Color, Urine YELLOW  YELLOW   APPearance CLEAR  CLEAR   Specific Gravity, Urine >1.030 (*) 1.005 - 1.030   pH 5.5  5.0 - 8.0   Glucose, UA NEGATIVE  NEGATIVE mg/dL   Hgb urine dipstick TRACE (*) NEGATIVE   Bilirubin Urine NEGATIVE  NEGATIVE   Ketones, ur NEGATIVE  NEGATIVE mg/dL   Protein, ur NEGATIVE  NEGATIVE mg/dL   Urobilinogen, UA 0.2  0.0 - 1.0 mg/dL   Nitrite NEGATIVE  NEGATIVE   Leukocytes, UA NEGATIVE  NEGATIVE  URINE MICROSCOPIC-ADD ON     Status: Abnormal   Collection Time    03/17/13  2:27 AM      Result Value Range   Squamous Epithelial / LPF MANY (*) RARE   WBC, UA 0-2  <3 WBC/hpf   RBC / HPF 0-2  <3 RBC/hpf   Bacteria, UA FEW (*) RARE    Radiological Exams on Admission: Dg Chest 2 View  03/17/2013   CLINICAL DATA:  Chest pain and fever.  EXAM: CHEST  2 VIEW  COMPARISON:  PA and lateral chest 05/03/2012.  FINDINGS: Heart size and mediastinal contours are within normal limits. Both lungs are clear. Visualized skeletal structures are unremarkable.  IMPRESSION: No acute disease.   Electronically Signed   By: Drusilla Kanner M.D.   On: 03/17/2013 02:21    Assessment/Plan Active Problems:   Cellulitis and abscess of leg, except foot   Obesity   Chest pain   Nausea & vomiting  Cellulitis Patient will be continued on vancomycin per pharmacy consult Follow blood cultures Will also obtain the ultrasound of the right lower extremity to rule out DVT  Chest pain Will admit the patient under telemetry EKG shows T-wave inversions in inferior and lateral leads Obtain 2-D echocardiogram Obtain 3 sets of cardiac enzymes First set of troponin is negative  Epigastric pain Will obtain LFTs and lipase Patient denies any history of gallstones or  pancreatitis  Nausea vomiting Patient will be given Zofran for nausea vomiting when necessary  Code status: Full code  Family discussion: Discussed with patient's daughter at bedside   Time Spent on Admission: 65 min  [facility] Pager: [phone] 03/17/2013, 3:43 AM  If 7PM-7AM, please contact night-coverage  [email or web address]  Password [email or web address]

## 2013-03-17 NOTE — Progress Notes (Deleted)
*  PRELIMINARY RESULTS* Echocardiogram 2D Echocardiogram has been performed.  Rufus Beske 03/17/2013, 10:17 AM

## 2013-03-17 NOTE — Progress Notes (Signed)
Notified Dr. Sherrie Mustache that te patient continues to c/o Chest pain, rating it a 8 now on a 1-10 scale.  I voiced to her what interventions I had done today with the patient and that the recent EKG was NSR, which appears to be better than the previous one.  I also voiced to her that [place] and Aundra Millet from the IV team were unable to place her PICC line in due to the the patient becoming very anxious and stating "she wanted to get up in pee".

## 2013-03-17 NOTE — ED Notes (Signed)
Hospitalist in to assess patient at this time

## 2013-03-17 NOTE — Progress Notes (Signed)
Notified Dr. Sherrie Mustache of the patients continuance to c/o chest pain without any relief from the current pain medication ordered.  She rates the pain around a 7 on a scale of 7-10.  She called me back and discussed the new orders she was placing in the computer.  I will follow them as ordered.

## 2013-03-17 NOTE — ED Provider Notes (Signed)
CSN: [id number]     Arrival date & time 03/17/13  0051 History   First MD Initiated Contact with Patient 03/17/13 0117     Chief Complaint  Patient presents with  . Pain all over    (Consider location/radiation/quality/duration/timing/severity/associated sxs/prior Treatment) HPI   42 year old female with myalgias with significant increased pain and right lower extremity. This reports a right lower leg has been slightly swollen and warm to the touch over the past several days. She denies any trauma. Relatively acute onset of chills, body aches and nausea earlier today. She has vomited. Subjective fever. No urinary complaints. L chest pain which began around 1900 yesterday. Achy in character. Does not radiate.  No intervention prior to arrival.  Past Medical History  Diagnosis Date  . Cellulitis   . Dermatitis 02/16/2012   Past Surgical History  Procedure Laterality Date  . Tubal ligation    . Breast surgery     No family history on file. History  Substance Use Topics  . Smoking status: Never Smoker   . Smokeless tobacco: Not on file  . Alcohol Use: No   OB History   Grav Para Term Preterm Abortions TAB SAB Ect Mult Living                 Review of Systems  All systems reviewed and negative, other than as noted in HPI.  All systems reviewed and negative, other than as noted in HPI.   Allergies  Review of patient's allergies indicates no known allergies.  Home Medications   Current Outpatient Rx  Name  Route  Sig  Dispense  Refill  . ibuprofen (ADVIL,MOTRIN) 200 MG tablet   Oral   Take 400 mg by mouth every 8 (eight) hours as needed. Aches and pain         . ibuprofen (ADVIL,MOTRIN) 800 MG tablet   Oral   Take 1 tablet (800 mg total) by mouth 3 (three) times daily.   21 tablet   0    BP 148/76  Pulse 118  Temp(Src) 100.9 F (38.3 C) (Oral)  Resp 22  Ht 5' 9.5" (1.765 m)  Wt 315 lb (142.883 kg)  BMI 45.87 kg/m2  SpO2 96% Physical Exam  Nursing note  and vitals reviewed. Constitutional: She appears well-developed and well-nourished. No distress.  HENT:  Head: Normocephalic and atraumatic.  Eyes: Conjunctivae are normal. Right eye exhibits no discharge. Left eye exhibits no discharge.  Neck: Neck supple.  Cardiovascular: Normal rate, regular rhythm and normal heart sounds.  Exam reveals no gallop and no friction rub.   No murmur heard. Pulmonary/Chest: Effort normal and breath sounds normal. No respiratory distress.  Abdominal: Soft. She exhibits no distension. There is tenderness. There is no rebound and no guarding.  Mild diffuse abdominal tenderness slightly worse in the epigastrium. No rebound or guarding. Does not appear to be distended but exam is limited by body habitus.  Musculoskeletal: She exhibits no edema and no tenderness.  Increased redness, warmth and tenderness to palpation in the right lower extremity consistent with cellulitis.  Neurological: She is alert.  Skin: Skin is warm and dry.  Psychiatric: She has a normal mood and affect. Her behavior is normal. Thought content normal.    ED Course  Procedures (including critical care time) Labs Review Labs Reviewed  CBC WITH DIFFERENTIAL - Abnormal; Notable for the following:    WBC 20.8 (*)    Neutrophils Relative % 91 (*)    Neutro Abs 18.9 (*)  Lymphocytes Relative 5 (*)    All other components within normal limits  BASIC METABOLIC PANEL - Abnormal; Notable for the following:    Potassium 3.4 (*)    Glucose, Bld 119 (*)    GFR calc non Af Amer 72 (*)    GFR calc Af Amer 83 (*)    All other components within normal limits  CULTURE, BLOOD (ROUTINE X 2)  CULTURE, BLOOD (ROUTINE X 2)  TROPONIN I  LACTIC ACID, PLASMA  URINALYSIS, ROUTINE W REFLEX MICROSCOPIC   Imaging Review Dg Chest 2 View  03/17/2013   CLINICAL DATA:  Chest pain and fever.  EXAM: CHEST  2 VIEW  COMPARISON:  PA and lateral chest 05/03/2012.  FINDINGS: Heart size and mediastinal contours are  within normal limits. Both lungs are clear. Visualized skeletal structures are unremarkable.  IMPRESSION: No acute disease.   Electronically Signed   By: Drusilla Kanner M.D.   On: 03/17/2013 02:21    MDM   1. Sepsis   2. Cellulitis, leg   3. Abnormal EKG     42 year old female with fever and myalgias. She has a right leg cellulitis. She is endorsing chest pain. I suspect this is secondary to her febrile illness/mylagias and is atypical for ACS with constant duration since around 1900 yesterday. She does have an abnormal EKG though. She has ST segment elevation in aVR > V1 with widespread ischemic changes elsewhere. This is a change from prior EKG from November 2013. Consider left main disease. Aspirin given. Initial troponin is normal. CXR w/o acute abnormality. Vancomycin for right leg cellulitis. Consider venous thrombosis, but I feel much less likely. We'll obtain basic labs, cultures and lactic acid. IV fluids and antipyretics. Will require admission.  Raeford Razor, MD 03/24/13 [phone]

## 2013-03-17 NOTE — Care Management Note (Signed)
    Page 1 of 1   03/21/2013     10:01:14 AM   CARE MANAGEMENT NOTE 03/21/2013  Patient:  Carrie Mitchell, Carrie Mitchell   Account Number:  [id number]  Date Initiated:  03/17/2013  Documentation initiated by:  Rosemary Holms  Subjective/Objective Assessment:   Pt lives at home with her spouse. Husband at bedside and states he can assist pt at DC with wound care. CM assured him that if he changes his mind, staff can assist setting up [facility].     Action/Plan:   Anticipated DC Date:     Anticipated DC Plan:  HOME/SELF CARE      DC Planning Services  CM consult      Choice offered to / List presented to:             Status of service:  Completed, signed off Medicare Important Message given?   (If response is "NO", the following Medicare IM given date fields will be blank) Date Medicare IM given:   Date Additional Medicare IM given:    Discharge Disposition:    Per UR Regulation:    If discussed at Long Length of Stay Meetings, dates discussed:    Comments:  03/17/13 Rosemary Holms RN BSN CM

## 2013-03-17 NOTE — Progress Notes (Signed)
The patient is a 42 year old with a history of cellulitis and eczema, who was admitted this morning by Dr. Sharl Ma for right leg pain and chest pain. The patient was briefly seen and examined. Her medical record, laboratory studies, and vital signs were reviewed. She continues to complain of chest pain and epigastric pain. CT angiogram of her chest was ordered to assess for PE. It was negative for PE. Her troponin I is negative x3. 2-D echocardiogram revealed preserved LV systolic function and grade 1 diastolic dysfunction. Her lipase and liver transaminases are within normal limits. Right upper extremity venous ultrasound was ordered and is pending. Abdominal ultrasound was recently ordered for further evaluation. A has not been performed yet. Her white blood cell count increased 24.3. Zosyn was added empirically. Continue vancomycin. Otherwise, continue supportive treatment and gentle IV fluids.   2-D echocardiogram:Study Conclusions  - Procedure narrative: Transthoracic echocardiography. Image quality was suboptimal. The study was technically difficult, as a result of body habitus. - Left ventricle: Wall thickness was increased in a pattern of mild LVH. Systolic function was vigorous. The estimated ejection fraction was in the range of 65% to 70%. Wall motion was normal; there were no regional wall motion abnormalities. There was an increased relative contribution of atrial contraction to ventricular filling. Doppler parameters are consistent with abnormal left ventricular relaxation (grade 1 diastolic dysfunction). - Mitral valve: Mildly calcified annulus. Mildly thickened leaflets. No significant regurgitation. - Left atrium: The atrium was mildly dilated. Transthoracic echocardiography. M-mode, complete 2D, spectral Doppler, and color Doppler. Height: Height: 175.3cm. Height: 69in. Weight: Weight: 142.9kg. Weight: 314.3lb. Body mass index: BMI: 46.5kg/m^2. Body surface area: BSA: 2.23m^2.  Blood pressure: 135/67. Patient status: Inpatient. Location: Bedside.     Elliot Cousin, M.D. [phone]

## 2013-03-17 NOTE — ED Notes (Signed)
Patient c/o general pain all over, also c/o chills states that it started tonight. Patient's right leg is swollen and warm to the touch, states that has been going on for the past couple of days and hasn't gotten any better. Also c/o being nauseated, and states that she has been throwing up at home. Patient is warm to the touch. Family at bedside at this time.

## 2013-03-17 NOTE — ED Notes (Signed)
Pt c/o pain all over especially her legs, states she has been vomiting also.

## 2013-03-17 NOTE — Progress Notes (Addendum)
ANTIBIOTIC CONSULT NOTE - FOLLOW UP  Pharmacy Consult for Vancomycin and Zosyn Indication: cellulitis  No Known Allergies  Patient Measurements: Height: 5' 9.5" (176.5 cm) Weight: 315 lb (142.883 kg) IBW/kg (Calculated) : 67.35  Vital Signs: Temp: 99.6 F (37.6 C) (09/26 0409) Temp src: Oral (09/26 0409) BP: 102/65 mmHg (09/26 0409) Pulse Rate: 107 (09/26 0409) Intake/Output from previous day:   Intake/Output from this shift:    Labs:  Recent Labs  03/17/13 0130 03/17/13 0515  WBC 20.8* 24.3*  HGB 13.6 12.5  PLT 359 340  CREATININE 0.96 1.08   Estimated Creatinine Clearance: 104.6 ml/min (by C-G formula based on Cr of 1.08). No results found for this basename: VANCOTROUGH, Leodis Binet, VANCORANDOM, GENTTROUGH, GENTPEAK, GENTRANDOM, TOBRATROUGH, TOBRAPEAK, TOBRARND, AMIKACINPEAK, AMIKACINTROU, AMIKACIN,  in the last 72 hours   Microbiology: Recent Results (from the past 720 hour(s))  CULTURE, BLOOD (ROUTINE X 2)     Status: None   Collection Time    03/17/13  2:03 AM      Result Value Range Status   Specimen Description BLOOD RIGHT ANTECUBITAL   Final   Special Requests     Final   Value: BOTTLES DRAWN AEROBIC AND ANAEROBIC AEB 11CC ANA 8CC   Culture NO GROWTH <24 HRS   Final   Report Status PENDING   Incomplete  CULTURE, BLOOD (ROUTINE X 2)     Status: None   Collection Time    03/17/13  2:04 AM      Result Value Range Status   Specimen Description BLOOD RIGHT ANTECUBITAL   Final   Special Requests BOTTLES DRAWN AEROBIC ONLY 10CC   Final   Culture NO GROWTH <24 HRS   Final   Report Status PENDING   Incomplete    Anti-infectives   Start     Dose/Rate Route Frequency Ordered Stop   03/17/13 1400  vancomycin (VANCOCIN) IVPB 1000 mg/200 mL premix     1,000 mg 200 mL/hr over 60 Minutes Intravenous Every 8 hours 03/17/13 0812     03/17/13 0430  vancomycin (VANCOCIN) IVPB 1000 mg/200 mL premix     1,000 mg 200 mL/hr over 60 Minutes Intravenous  Once 03/17/13  0419 03/17/13 0609   03/17/13 0145  vancomycin (VANCOCIN) IVPB 1000 mg/200 mL premix     1,000 mg 200 mL/hr over 60 Minutes Intravenous  Once 03/17/13 0133 03/17/13 0348     Assessment: 42yo female with fever, leg abscess and n/v.  Pt is obese with good renal fxn.  Vancomycin loading dose given this am.   Estimated Creatinine Clearance: 104.6 ml/min (by C-G formula based on Cr of 1.08).  Goal of Therapy:  Vancomycin trough level 10-15 mcg/ml Eradicate infection.  Plan: Vancomycin 1gm IV q8hrs Check trough at steady state Zosyn 3.375gm IV q8h to be infused over 4 hrs Monitor labs, renal fxn, and cultures  Valrie Hart A 03/17/2013,8:14 AM

## 2013-03-17 NOTE — Progress Notes (Signed)
Utilization Review Complete  

## 2013-03-17 NOTE — Progress Notes (Signed)
ANTIBIOTIC CONSULT NOTE-Preliminary  Pharmacy Consult for Vancomycin Indication: Cellulitis   No Known Allergies  Patient Measurements: Height: 5' 9.5" (176.5 cm) Weight: 315 lb (142.883 kg) IBW/kg (Calculated) : 67.35  Vital Signs: Temp: 99.6 F (37.6 C) (09/26 0409) Temp src: Oral (09/26 0409) BP: 102/65 mmHg (09/26 0409) Pulse Rate: 107 (09/26 0409)  Labs:  Recent Labs  03/17/13 0130  WBC 20.8*  HGB 13.6  PLT 359  CREATININE 0.96    Estimated Creatinine Clearance: 117.6 ml/min (by C-G formula based on Cr of 0.96).  No results found for this basename: VANCOTROUGH, Leodis Binet, VANCORANDOM, GENTTROUGH, GENTPEAK, GENTRANDOM, TOBRATROUGH, TOBRAPEAK, TOBRARND, AMIKACINPEAK, AMIKACINTROU, AMIKACIN,  in the last 72 hours   Microbiology: Recent Results (from the past 720 hour(s))  CULTURE, BLOOD (ROUTINE X 2)     Status: None   Collection Time    03/17/13  2:03 AM      Result Value Range Status   Specimen Description BLOOD RIGHT ANTECUBITAL   Final   Special Requests     Final   Value: BOTTLES DRAWN AEROBIC AND ANAEROBIC AEB 11CC ANA 8CC   Culture PENDING   Incomplete   Report Status PENDING   Incomplete  CULTURE, BLOOD (ROUTINE X 2)     Status: None   Collection Time    03/17/13  2:04 AM      Result Value Range Status   Specimen Description BLOOD RIGHT ANTECUBITAL   Final   Special Requests BOTTLES DRAWN AEROBIC ONLY 10CC   Final   Culture PENDING   Incomplete   Report Status PENDING   Incomplete    Medical History: Past Medical History  Diagnosis Date  . Cellulitis   . Dermatitis 02/16/2012    Medications:  Vancomycin 1 Gm IV in the ED.  Assessment: 42 yo female with fever, n/v, leg abscess, and chest pain x one day. PMH sig for obesity, dermatitis, and cellulitis. WBCs elevated. Blood Cx pending. Empiric abx for RLL cellulitis.   Goal of Therapy:  Vancomycin troughs 10-15 mcg/ml Eradication of infection   Plan:  Preliminary review of pertinent  patient information completed.  Protocol will be initiated with a one-time dose of Vancomycin 1 Gm after the initial 1 Gm dose in the ED for a total of 2 Gm.  Jeani Hawking clinical pharmacist will complete review during morning rounds to assess patient and finalize treatment regimen.  Arelia Sneddon, [facility] 03/17/2013,4:22 AM

## 2013-03-18 ENCOUNTER — Encounter (HOSPITAL_COMMUNITY): Payer: Self-pay | Admitting: Internal Medicine

## 2013-03-18 DIAGNOSIS — N289 Disorder of kidney and ureter, unspecified: Secondary | ICD-10-CM

## 2013-03-18 DIAGNOSIS — L03115 Cellulitis of right lower limb: Secondary | ICD-10-CM | POA: Diagnosis present

## 2013-03-18 DIAGNOSIS — R1011 Right upper quadrant pain: Secondary | ICD-10-CM | POA: Diagnosis present

## 2013-03-18 DIAGNOSIS — K8020 Calculus of gallbladder without cholecystitis without obstruction: Secondary | ICD-10-CM

## 2013-03-18 HISTORY — DX: Calculus of gallbladder without cholecystitis without obstruction: K80.20

## 2013-03-18 LAB — CBC
HCT: 36.4 % (ref 36.0–46.0)
Hemoglobin: 11.7 g/dL — ABNORMAL LOW (ref 12.0–15.0)
MCHC: 32.1 g/dL (ref 30.0–36.0)
Platelets: 285 10*3/uL (ref 150–400)
RBC: 4.15 MIL/uL (ref 3.87–5.11)

## 2013-03-18 LAB — BASIC METABOLIC PANEL
Calcium: 8.7 mg/dL (ref 8.4–10.5)
Chloride: 101 mEq/L (ref 96–112)
GFR calc Af Amer: 57 mL/min — ABNORMAL LOW (ref >90)
GFR calc non Af Amer: 49 mL/min — ABNORMAL LOW (ref >90)
Potassium: 3.8 mEq/L (ref 3.5–5.1)
Sodium: 135 mEq/L (ref 135–145)

## 2013-03-18 LAB — HEPATIC FUNCTION PANEL
ALT: 26 U/L (ref 0–35)
AST: 29 U/L (ref 0–37)
Albumin: 2.8 g/dL — ABNORMAL LOW (ref 3.5–5.2)
Alkaline Phosphatase: 84 U/L (ref 39–117)
Total Protein: 7.2 g/dL (ref 6.0–8.3)

## 2013-03-18 MED ORDER — POTASSIUM CHLORIDE IN NACL 20-0.9 MEQ/L-% IV SOLN
INTRAVENOUS | Status: DC
  Administered 2013-03-18: 75 mL/h via INTRAVENOUS
  Administered 2013-03-19 (×2): via INTRAVENOUS

## 2013-03-18 MED ORDER — HYDROMORPHONE HCL PF 1 MG/ML IJ SOLN
0.5000 mg | INTRAMUSCULAR | Status: DC | PRN
  Administered 2013-03-18 – 2013-03-20 (×8): 0.5 mg via INTRAVENOUS
  Filled 2013-03-18 (×8): qty 1

## 2013-03-18 MED ORDER — IBUPROFEN 800 MG PO TABS
400.0000 mg | ORAL_TABLET | Freq: Once | ORAL | Status: AC
Start: 2013-03-18 — End: 2013-03-18
  Administered 2013-03-18: 400 mg via ORAL
  Filled 2013-03-18: qty 1

## 2013-03-18 MED ORDER — VANCOMYCIN HCL 10 G IV SOLR
1250.0000 mg | Freq: Two times a day (BID) | INTRAVENOUS | Status: DC
  Administered 2013-03-18 – 2013-03-20 (×4): 1250 mg via INTRAVENOUS
  Filled 2013-03-18 (×4): qty 1250

## 2013-03-18 NOTE — Consult Note (Signed)
Reason for Consult: Chest pain, gallstones Referring Physician: [facility] hospital[facility]  Carrie Mitchell is an 42 y.o. female.  HPI: Patient presented to [facility] with chest pain. This is in the lower substernal region. No significant radiation. It is intermittent. No exacerbating or relieving features. No significant radiation. No symptom changes with fatty greasy foods. Patient denies any recent diet with fatty greasy foods. No change in bowel movements. No melena or hematochezia. No hematemesis. She denies any fevers or chills.  Past Medical History  Diagnosis Date  . Cellulitis   . Dermatitis 02/16/2012  . Cholelithiasis 03/18/2013    Past Surgical History  Procedure Laterality Date  . Tubal ligation    . Breast surgery      History reviewed. No pertinent family history.  Social History:  reports that she has never smoked. She does not have any smokeless tobacco history on file. She reports that she does not drink alcohol or use illicit drugs.  Allergies: No Known Allergies  Medications:  I have reviewed the patient's current medications. Prior to Admission:  No prescriptions prior to admission   Scheduled: . enoxaparin (LOVENOX) injection  40 mg Subcutaneous Q24H  . pantoprazole (PROTONIX) IV  40 mg Intravenous Q12H  . piperacillin-tazobactam (ZOSYN)  IV  3.375 g Intravenous Q8H  . vancomycin  1,250 mg Intravenous Q12H   Continuous: . 0.9 % NaCl with KCl 20 mEq / L 75 mL/hr (03/18/13 1344)   ION:[id number], acetaminophen, HYDROmorphone (DILAUDID) injection, ondansetron (ZOFRAN) IV, ondansetron, sodium chloride Anti-infectives   Start     Dose/Rate Route Frequency Ordered Stop   03/18/13 2000  vancomycin (VANCOCIN) 1,250 mg in sodium chloride 0.9 % 250 mL IVPB     1,250 mg 166.7 mL/hr over 90 Minutes Intravenous Every 12 hours 03/18/13 1036     03/17/13 1400  vancomycin (VANCOCIN) IVPB 1000 mg/200 mL premix  Status:  Discontinued     1,000 mg 200 mL/hr over  60 Minutes Intravenous Every 8 hours 03/17/13 0812 03/18/13 1036   03/17/13 1200  piperacillin-tazobactam (ZOSYN) IVPB 3.375 g     3.375 g 12.5 mL/hr over 240 Minutes Intravenous Every 8 hours 03/17/13 1117     03/17/13 0430  vancomycin (VANCOCIN) IVPB 1000 mg/200 mL premix     1,000 mg 200 mL/hr over 60 Minutes Intravenous  Once 03/17/13 0419 03/17/13 0609   03/17/13 0145  vancomycin (VANCOCIN) IVPB 1000 mg/200 mL premix     1,000 mg 200 mL/hr over 60 Minutes Intravenous  Once 03/17/13 0133 03/17/13 0348      Results for orders placed during the hospital encounter of 03/17/13 (from the past 48 hour(s))  TROPONIN I     Status: None   Collection Time    03/17/13  1:30 AM      Result Value Range   Troponin I <0.30  <0.30 ng/mL   Comment:            Due to the release kinetics of cTnI,     a negative result within the first hours     of the onset of symptoms does not rule out     myocardial infarction with certainty.     If myocardial infarction is still suspected,     repeat the test at appropriate intervals.  CBC WITH DIFFERENTIAL     Status: Abnormal   Collection Time    03/17/13  1:30 AM      Result Value Range   WBC 20.8 (*) 4.0 -  10.5 K/uL   RBC 4.74  3.87 - 5.11 MIL/uL   Hemoglobin 13.6  12.0 - 15.0 g/dL   HCT 16.1  09.6 - 04.5 %   MCV 86.3  78.0 - 100.0 fL   MCH 28.7  26.0 - 34.0 pg   MCHC 33.3  30.0 - 36.0 g/dL   RDW 40.9  81.1 - 91.4 %   Platelets 359  150 - 400 K/uL   Neutrophils Relative % 91 (*) 43 - 77 %   Neutro Abs 18.9 (*) 1.7 - 7.7 K/uL   Lymphocytes Relative 5 (*) 12 - 46 %   Lymphs Abs 1.0  0.7 - 4.0 K/uL   Monocytes Relative 4  3 - 12 %   Monocytes Absolute 0.9  0.1 - 1.0 K/uL   Eosinophils Relative 0  0 - 5 %   Eosinophils Absolute 0.0  0.0 - 0.7 K/uL   Basophils Relative 0  0 - 1 %   Basophils Absolute 0.0  0.0 - 0.1 K/uL  BASIC METABOLIC PANEL     Status: Abnormal   Collection Time    03/17/13  1:30 AM      Result Value Range   Sodium 137  135  - 145 mEq/L   Potassium 3.4 (*) 3.5 - 5.1 mEq/L   Chloride 99  96 - 112 mEq/L   CO2 25  19 - 32 mEq/L   Glucose, Bld 119 (*) 70 - 99 mg/dL   BUN 9  6 - 23 mg/dL   Creatinine, Ser 7.82  0.50 - 1.10 mg/dL   Calcium 9.7  8.4 - 95.6 mg/dL   GFR calc non Af Amer 72 (*) >90 mL/min   GFR calc Af Amer 83 (*) >90 mL/min   Comment: (NOTE)     The eGFR has been calculated using the CKD EPI equation.     This calculation has not been validated in all clinical situations.     eGFR's persistently <90 mL/min signify possible Chronic Kidney     Disease.  CULTURE, BLOOD (ROUTINE X 2)     Status: None   Collection Time    03/17/13  2:03 AM      Result Value Range   Specimen Description BLOOD RIGHT ANTECUBITAL     Special Requests       Value: BOTTLES DRAWN AEROBIC AND ANAEROBIC AEB 11CC ANA 8CC   Culture NO GROWTH 1 DAY     Report Status PENDING    LACTIC ACID, PLASMA     Status: Abnormal   Collection Time    03/17/13  2:03 AM      Result Value Range   Lactic Acid, Venous 2.3 (*) 0.5 - 2.2 mmol/L  CULTURE, BLOOD (ROUTINE X 2)     Status: None   Collection Time    03/17/13  2:04 AM      Result Value Range   Specimen Description BLOOD RIGHT ANTECUBITAL     Special Requests BOTTLES DRAWN AEROBIC ONLY 10CC     Culture NO GROWTH 1 DAY     Report Status PENDING    URINALYSIS, ROUTINE W REFLEX MICROSCOPIC     Status: Abnormal   Collection Time    03/17/13  2:27 AM      Result Value Range   Color, Urine YELLOW  YELLOW   APPearance CLEAR  CLEAR   Specific Gravity, Urine >1.030 (*) 1.005 - 1.030   pH 5.5  5.0 - 8.0   Glucose, UA NEGATIVE  NEGATIVE mg/dL   Hgb urine dipstick TRACE (*) NEGATIVE   Bilirubin Urine NEGATIVE  NEGATIVE   Ketones, ur NEGATIVE  NEGATIVE mg/dL   Protein, ur NEGATIVE  NEGATIVE mg/dL   Urobilinogen, UA 0.2  0.0 - 1.0 mg/dL   Nitrite NEGATIVE  NEGATIVE   Leukocytes, UA NEGATIVE  NEGATIVE  URINE MICROSCOPIC-ADD ON     Status: Abnormal   Collection Time    03/17/13  2:27  AM      Result Value Range   Squamous Epithelial / LPF MANY (*) RARE   WBC, UA 0-2  <3 WBC/hpf   RBC / HPF 0-2  <3 RBC/hpf   Bacteria, UA FEW (*) RARE  HEPATIC FUNCTION PANEL     Status: Abnormal   Collection Time    03/17/13  3:29 AM      Result Value Range   Total Protein 8.4 (*) 6.0 - 8.3 g/dL   Albumin 3.8  3.5 - 5.2 g/dL   AST 23  0 - 37 U/L   ALT 27  0 - 35 U/L   Alkaline Phosphatase 95  39 - 117 U/L   Total Bilirubin 0.6  0.3 - 1.2 mg/dL   Bilirubin, Direct <7.8  0.0 - 0.3 mg/dL   Indirect Bilirubin NOT CALCULATED  0.3 - 0.9 mg/dL  LIPASE, BLOOD     Status: None   Collection Time    03/17/13  3:38 AM      Result Value Range   Lipase 12  11 - 59 U/L  CBC     Status: Abnormal   Collection Time    03/17/13  5:15 AM      Result Value Range   WBC 24.3 (*) 4.0 - 10.5 K/uL   RBC 4.37  3.87 - 5.11 MIL/uL   Hemoglobin 12.5  12.0 - 15.0 g/dL   HCT 46.9  62.9 - 52.8 %   MCV 86.5  78.0 - 100.0 fL   MCH 28.6  26.0 - 34.0 pg   MCHC 33.1  30.0 - 36.0 g/dL   RDW 41.3  24.4 - 01.0 %   Platelets 340  150 - 400 K/uL  CREATININE, SERUM     Status: Abnormal   Collection Time    03/17/13  5:15 AM      Result Value Range   Creatinine, Ser 1.08  0.50 - 1.10 mg/dL   GFR calc non Af Amer 62 (*) >90 mL/min   GFR calc Af Amer 72 (*) >90 mL/min   Comment: (NOTE)     The eGFR has been calculated using the CKD EPI equation.     This calculation has not been validated in all clinical situations.     eGFR's persistently <90 mL/min signify possible Chronic Kidney     Disease.  TROPONIN I     Status: None   Collection Time    03/17/13  5:15 AM      Result Value Range   Troponin I <0.30  <0.30 ng/mL   Comment:            Due to the release kinetics of cTnI,     a negative result within the first hours     of the onset of symptoms does not rule out     myocardial infarction with certainty.     If myocardial infarction is still suspected,     repeat the test at appropriate intervals.   TROPONIN I     Status: None  Collection Time    03/17/13 10:19 AM      Result Value Range   Troponin I <0.30  <0.30 ng/mL   Comment:            Due to the release kinetics of cTnI,     a negative result within the first hours     of the onset of symptoms does not rule out     myocardial infarction with certainty.     If myocardial infarction is still suspected,     repeat the test at appropriate intervals.  TROPONIN I     Status: None   Collection Time    03/17/13  3:41 PM      Result Value Range   Troponin I <0.30  <0.30 ng/mL   Comment:            Due to the release kinetics of cTnI,     a negative result within the first hours     of the onset of symptoms does not rule out     myocardial infarction with certainty.     If myocardial infarction is still suspected,     repeat the test at appropriate intervals.  LACTIC ACID, PLASMA     Status: None   Collection Time    03/17/13  3:41 PM      Result Value Range   Lactic Acid, Venous 0.7  0.5 - 2.2 mmol/L  VANCOMYCIN, TROUGH     Status: None   Collection Time    03/18/13  4:59 AM      Result Value Range   Vancomycin Tr 17.5  10.0 - 20.0 ug/mL  BASIC METABOLIC PANEL     Status: Abnormal   Collection Time    03/18/13  5:00 AM      Result Value Range   Sodium 135  135 - 145 mEq/L   Potassium 3.8  3.5 - 5.1 mEq/L   Chloride 101  96 - 112 mEq/L   CO2 26  19 - 32 mEq/L   Glucose, Bld 91  70 - 99 mg/dL   BUN 13  6 - 23 mg/dL   Creatinine, Ser 7.82 (*) 0.50 - 1.10 mg/dL   Calcium 8.7  8.4 - 95.6 mg/dL   GFR calc non Af Amer 49 (*) >90 mL/min   GFR calc Af Amer 57 (*) >90 mL/min   Comment: (NOTE)     The eGFR has been calculated using the CKD EPI equation.     This calculation has not been validated in all clinical situations.     eGFR's persistently <90 mL/min signify possible Chronic Kidney     Disease.  CBC     Status: Abnormal   Collection Time    03/18/13  5:00 AM      Result Value Range   WBC 16.0 (*) 4.0 - 10.5  K/uL   RBC 4.15  3.87 - 5.11 MIL/uL   Hemoglobin 11.7 (*) 12.0 - 15.0 g/dL   HCT 21.3  08.6 - 57.8 %   MCV 87.7  78.0 - 100.0 fL   MCH 28.2  26.0 - 34.0 pg   MCHC 32.1  30.0 - 36.0 g/dL   RDW 46.9  62.9 - 52.8 %   Platelets 285  150 - 400 K/uL  HEPATIC FUNCTION PANEL     Status: Abnormal   Collection Time    03/18/13  7:57 AM      Result Value Range   Total Protein 7.2  6.0 - 8.3 g/dL   Albumin 2.8 (*) 3.5 - 5.2 g/dL   AST 29  0 - 37 U/L   ALT 26  0 - 35 U/L   Alkaline Phosphatase 84  39 - 117 U/L   Total Bilirubin 0.9  0.3 - 1.2 mg/dL   Bilirubin, Direct 0.3  0.0 - 0.3 mg/dL   Indirect Bilirubin 0.6  0.3 - 0.9 mg/dL  LIPASE, BLOOD     Status: None   Collection Time    03/18/13  7:57 AM      Result Value Range   Lipase 12  11 - 59 U/L    Dg Chest 2 View  03/17/2013   CLINICAL DATA:  Chest pain and fever.  EXAM: CHEST  2 VIEW  COMPARISON:  PA and lateral chest 05/03/2012.  FINDINGS: Heart size and mediastinal contours are within normal limits. Both lungs are clear. Visualized skeletal structures are unremarkable.  IMPRESSION: No acute disease.   Electronically Signed   By: Drusilla Kanner M.D.   On: 03/17/2013 02:21   Ct Angio Chest Pe W/cm &/or Wo Cm  03/17/2013   CLINICAL DATA:  Chest pain and shortness of breath. Question pulmonary embolus.  EXAM: CT ANGIOGRAPHY CHEST WITH CONTRAST  TECHNIQUE: Multidetector CT imaging of the chest was performed using the standard protocol during bolus administration of intravenous contrast. Multiplanar CT image reconstructions including MIPs were obtained to evaluate the vascular anatomy.  CONTRAST:  OMNIPAQUE IOHEXOL 350 MG/ML SOLN  COMPARISON:  None.  FINDINGS: Pulmonary arterial opacification is suboptimal. This degrades evaluation of distal segmental vessels. No significant proximal segmental or lobar filling defects are evident to suggest pulmonary emboli. The heart size is normal. No significant pleural or pericardial effusion is  present.  Mild diffuse fatty infiltration of the liver is evident. Limited imaging of the upper abdomen is unremarkable otherwise. The thoracic inlet is within normal limits.  The lung windows demonstrate mild dependent atelectasis bilaterally. No focal nodule, mass, or airspace disease is evident.  The bone windows demonstrate a focal calcified disc at T5-6. No focal lytic or blastic lesions are evident.  Review of the MIP images confirms the above findings.  IMPRESSION: 1. No evidence for pulmonary embolus. Suboptimal contrast bolus degrades evaluation of distal segmental arteries. No proximal segmental or lobar embolus is evident. 2. Mild dependent atelectasis without significant airspace disease. 3. Diffuse fatty infiltration of the liver.   Electronically Signed   By: Gennette Pac   On: 03/17/2013 12:18   US Abdomen Limited Ruq  03/17/2013   CLINICAL DATA:  Right upper quadrant abdominal pain.  EXAM: US ABDOMEN LIMITED - RIGHT UPPER QUADRANT  COMPARISON:  No prior ultrasound. Visualized upper abdomen on CTA chest earlier same date.  FINDINGS: Technically difficult examination due to patient body habitus and the fact that she was somnolent and unable to turn into the lateral decubitus position.  Gallbladder:  Contracted gallbladder containing numerous shadowing gallstones. Borderline gallbladder wall thickness at 3 mm. No pericholecystic fluid. Sonographic Murphy sign not obtainable. The gallbladder was not imaged on the earlier CTA chest.  Common bile duct  Diameter: 5 mm proximally. Distal duct obscured by duodenum bowel gas.  Liver:  Mild hepatomegaly. Diffusely increased and coarsened echotexture without focal parenchymal abnormality. Patent portal vein with hepatopetal flow.  IMPRESSION: 1. Contracted gallbladder containing numerous gallstones. 2. Mild hepatomegaly. Diffuse hepatic steatosis and or hepatocellular disease without focal hepatic parenchymal abnormality.   Electronically Signed   By:  Maisie Fus  Lawrence   On: 03/17/2013 19:38    Review of Systems  Constitutional: Negative for fever and chills.  HENT: Negative.   Eyes: Negative.   Respiratory: Negative.   Cardiovascular: Positive for chest pain. Negative for palpitations.  Gastrointestinal: Positive for nausea. Negative for heartburn, vomiting, abdominal pain, diarrhea, constipation, blood in stool and melena.  Genitourinary: Negative.   Musculoskeletal: Negative.   Skin: Positive for rash (right leg).  Neurological: Negative.   Endo/Heme/Allergies: Negative.   Psychiatric/Behavioral: Negative.    Blood pressure 117/63, pulse 80, temperature 98.1 F (36.7 C), temperature source Oral, resp. rate 20, height 5' 9.5" (1.765 m), weight 142.883 kg (315 lb), SpO2 97.00%. Physical Exam  Constitutional: She is oriented to person, place, and time. She appears well-developed and well-nourished. No distress.  Obese  HENT:  Head: Normocephalic and atraumatic.  Eyes: Conjunctivae and EOM are normal. Pupils are equal, round, and reactive to light. No scleral icterus.  Neck: Normal range of motion. Neck supple. No tracheal deviation present. No thyromegaly present.  Cardiovascular: Normal rate, regular rhythm and normal heart sounds.   Respiratory: Effort normal and breath sounds normal. No respiratory distress.  GI: She exhibits no distension and no mass. There is no tenderness. There is no rebound and no guarding.  Lymphadenopathy:    She has no cervical adenopathy.  Neurological: She is alert and oriented to person, place, and time.  Skin: Skin is warm and dry.    Assessment/Plan: Cholelithiasis, chest pain. Given patient's extensive workup for cardiac etiology I do suspect this is likely related to a biliary etiology and stent. Patient actually seen me as an outpatient and discussion of a cholecystectomy had been entertained at that time. Given her current symptomatology I would continue the patient on liquids for now.  Possibly advancing her diet tomorrow she her symptomatology remain control. I have discussed with the patient possibly proceeding to the operating room on Monday to undergo a laparoscopic possible open cholecystectomy. Alternatively if patient continues to be asymptomatic and tolerates a regular diet this can still be considered as an outpatient electively. I will continue to follow her course of the next 24-48 hours.  Avon Mergenthaler C 03/18/2013, 3:29 PM

## 2013-03-18 NOTE — Progress Notes (Signed)
ANTIBIOTIC CONSULT NOTE  Pharmacy Consult for Vancomycin and Zosyn Indication: cellulitis  No Known Allergies  Patient Measurements: Height: 5' 9.5" (176.5 cm) Weight: 315 lb (142.883 kg) IBW/kg (Calculated) : 67.35  Vital Signs: Temp: 99 F (37.2 C) (09/27 0935) Temp src: Oral (09/27 0935) BP: 113/86 mmHg (09/27 0418) Pulse Rate: 96 (09/27 0418) Intake/Output from previous day: 09/26 0701 - 09/27 0700 In: 620 [P.O.:600; I.V.:20] Out: -  Intake/Output from this shift:    Labs:  Recent Labs  03/17/13 0130 03/17/13 0515 03/18/13 0500  WBC 20.8* 24.3* 16.0*  HGB 13.6 12.5 11.7*  PLT 359 340 285  CREATININE 0.96 1.08 1.32*   Estimated Creatinine Clearance: 85.5 ml/min (by C-G formula based on Cr of 1.32).  Recent Labs  03/18/13 0459  VANCOTROUGH 17.5     Microbiology: Recent Results (from the past 720 hour(s))  CULTURE, BLOOD (ROUTINE X 2)     Status: None   Collection Time    03/17/13  2:03 AM      Result Value Range Status   Specimen Description BLOOD RIGHT ANTECUBITAL   Final   Special Requests     Final   Value: BOTTLES DRAWN AEROBIC AND ANAEROBIC AEB 11CC ANA 8CC   Culture NO GROWTH <24 HRS   Final   Report Status PENDING   Incomplete  CULTURE, BLOOD (ROUTINE X 2)     Status: None   Collection Time    03/17/13  2:04 AM      Result Value Range Status   Specimen Description BLOOD RIGHT ANTECUBITAL   Final   Special Requests BOTTLES DRAWN AEROBIC ONLY 10CC   Final   Culture NO GROWTH <24 HRS   Final   Report Status PENDING   Incomplete    Anti-infectives   Start     Dose/Rate Route Frequency Ordered Stop   03/17/13 1400  vancomycin (VANCOCIN) IVPB 1000 mg/200 mL premix     1,000 mg 200 mL/hr over 60 Minutes Intravenous Every 8 hours 03/17/13 0812     03/17/13 1200  piperacillin-tazobactam (ZOSYN) IVPB 3.375 g     3.375 g 12.5 mL/hr over 240 Minutes Intravenous Every 8 hours 03/17/13 1117     03/17/13 0430  vancomycin (VANCOCIN) IVPB 1000 mg/200  mL premix     1,000 mg 200 mL/hr over 60 Minutes Intravenous  Once 03/17/13 0419 03/17/13 0609   03/17/13 0145  vancomycin (VANCOCIN) IVPB 1000 mg/200 mL premix     1,000 mg 200 mL/hr over 60 Minutes Intravenous  Once 03/17/13 0133 03/17/13 0348     Assessment: 41yo female with fever, leg abscess and n/v.  Pt is obese with good renal fxn.   Estimated Creatinine Clearance: 85.5 ml/min (by C-G formula based on Cr of 1.32). Trough slightly above goal, micro pending.  Goal of Therapy:  Vancomycin trough level 10-15 mcg/ml Eradicate infection.  Plan: Change Vancomycin to 1250mg  IV every 12 hours. Repeat trough at steady state Zosyn 3.375gm IV q8h to be infused over 4 hrs Monitor labs, renal fxn, and cultures  Mady Gemma 03/18/2013,10:31 AM

## 2013-03-18 NOTE — Progress Notes (Signed)
[facility] PROGRESS NOTE  [place] Heaslip ZHY:[id number] DOB: [birth year] DOA: 03/17/2013 PCP: Pershing Proud    Code Status: Full code Family Communication: Discuss with husband Disposition Plan: Home when clinically appropriate.   Consultants:  General surgery pending.  Procedures:  2-D echocardiogram: Study Conclusions - Procedure narrative: Transthoracic echocardiography. Image quality was suboptimal. The study was technically difficult, as a result of body habitus. - Left ventricle: Wall thickness was increased in a pattern of mild LVH. Systolic function was vigorous. The estimated ejection fraction was in the range of 65% to 70%. Wall motion was normal; there were no regional wall motion abnormalities. There was an increased relative contribution of atrial contraction to ventricular filling. Doppler parameters are consistent with abnormal left ventricular relaxation (grade 1 diastolic dysfunction). - Mitral valve: Mildly calcified annulus. Mildly thickened leaflets. No significant regurgitation. - Left atrium: The atrium was mildly dilated. Transthoracic echocardiography. M-mode, complete 2D, spectral Doppler, and color Doppler. Height: Height: 175.3cm. Height: 69in. Weight: Weight: 142.9kg. Weight: 314.3lb. Body mass index: BMI: 46.5kg/m^2. Body surface area: BSA: 2.32m^2. Blood pressure: 135/67. Patient status: Inpatient. Location: Bedside.   Antibiotics:  IV vancomycin 03/17/2013>>  Zosyn 03/17/2013>>  HPI/Subjective: The patient complained of epigastric and right upper quadrant abdominal pain associated with chest pain yesterday. This morning, the pain has subsided. She is no longer nauseated. She has minimal discomfort in her right leg. She has had subjective fever overnight.  Objective: Filed Vitals:   03/18/13 0935  BP:   Pulse:   Temp: 99 F (37.2 C)  Resp:    Maximum temperature 102.8. Current temperature 99. Pulse 96. Respiratory  20. Blood pressure 113/86. Oxygen saturation 98% on room air.   Intake/Output Summary (Last 24 hours) at 03/18/13 1143 Last data filed at 03/17/13 1930  Gross per 24 hour  Intake    500 ml  Output      0 ml  Net    500 ml   Filed Weights   03/17/13 0105  Weight: 142.883 kg (315 lb)    Exam:   General:  Obese alert 42 year old African-American woman laying in bed, in no acute distress.  Cardiovascular: S1, S2, with borderline tachycardia and soft systolic murmur.  Respiratory: Decreased breath sounds in the bases but clear anteriorly.  Abdomen: Obese, positive bowel sounds, soft, mild tenderness in the epigastrium and right upper cautery without rebound or Murphy sign.  Musculoskeletal:  Right lower extremity with 1+ nonpitting edema, global erythema, warm to touch, and moderately tender. Pedal pulses palpable bilaterally. Scaly calluses on the plantar surfaces of both feet. No pedal edema on the left.  Data Reviewed: Basic Metabolic Panel:  Recent Labs Lab 03/17/13 0130 03/17/13 0515 03/18/13 0500  NA 137  --  135  K 3.4*  --  3.8  CL 99  --  101  CO2 25  --  26  GLUCOSE 119*  --  91  BUN 9  --  13  CREATININE 0.96 1.08 1.32*  CALCIUM 9.7  --  8.7   Liver Function Tests:  Recent Labs Lab 03/17/13 0329 03/18/13 0757  AST 23 29  ALT 27 26  ALKPHOS 95 84  BILITOT 0.6 0.9  PROT 8.4* 7.2  ALBUMIN 3.8 2.8*    Recent Labs Lab 03/17/13 0338 03/18/13 0757  LIPASE 12 12   No results found for this basename: AMMONIA,  in the last 168 hours CBC:  Recent Labs Lab 03/17/13 0130 03/17/13 0515 03/18/13 0500  WBC 20.8* 24.3* 16.0*  NEUTROABS 18.9*  --   --   HGB 13.6 12.5 11.7*  HCT 40.9 37.8 36.4  MCV 86.3 86.5 87.7  PLT 359 340 285   Cardiac Enzymes:  Recent Labs Lab 03/17/13 0130 03/17/13 0515 03/17/13 1019 03/17/13 1541  TROPONINI <0.30 <0.30 <0.30 <0.30   BNP (last 3 results) No results found for this basename: PROBNP,  in the last 8760  hours CBG: No results found for this basename: GLUCAP,  in the last 168 hours  Recent Results (from the past 240 hour(s))  CULTURE, BLOOD (ROUTINE X 2)     Status: None   Collection Time    03/17/13  2:03 AM      Result Value Range Status   Specimen Description BLOOD RIGHT ANTECUBITAL   Final   Special Requests     Final   Value: BOTTLES DRAWN AEROBIC AND ANAEROBIC AEB 11CC ANA 8CC   Culture NO GROWTH 1 DAY   Final   Report Status PENDING   Incomplete  CULTURE, BLOOD (ROUTINE X 2)     Status: None   Collection Time    03/17/13  2:04 AM      Result Value Range Status   Specimen Description BLOOD RIGHT ANTECUBITAL   Final   Special Requests BOTTLES DRAWN AEROBIC ONLY 10CC   Final   Culture NO GROWTH 1 DAY   Final   Report Status PENDING   Incomplete     Studies: Dg Chest 2 View  03/17/2013   CLINICAL DATA:  Chest pain and fever.  EXAM: CHEST  2 VIEW  COMPARISON:  PA and lateral chest 05/03/2012.  FINDINGS: Heart size and mediastinal contours are within normal limits. Both lungs are clear. Visualized skeletal structures are unremarkable.  IMPRESSION: No acute disease.   Electronically Signed   By: Drusilla Kanner M.D.   On: 03/17/2013 02:21   Ct Angio Chest Pe W/cm &/or Wo Cm  03/17/2013   CLINICAL DATA:  Chest pain and shortness of breath. Question pulmonary embolus.  EXAM: CT ANGIOGRAPHY CHEST WITH CONTRAST  TECHNIQUE: Multidetector CT imaging of the chest was performed using the standard protocol during bolus administration of intravenous contrast. Multiplanar CT image reconstructions including MIPs were obtained to evaluate the vascular anatomy.  CONTRAST:  OMNIPAQUE IOHEXOL 350 MG/ML SOLN  COMPARISON:  None.  FINDINGS: Pulmonary arterial opacification is suboptimal. This degrades evaluation of distal segmental vessels. No significant proximal segmental or lobar filling defects are evident to suggest pulmonary emboli. The heart size is normal. No significant pleural or  pericardial effusion is present.  Mild diffuse fatty infiltration of the liver is evident. Limited imaging of the upper abdomen is unremarkable otherwise. The thoracic inlet is within normal limits.  The lung windows demonstrate mild dependent atelectasis bilaterally. No focal nodule, mass, or airspace disease is evident.  The bone windows demonstrate a focal calcified disc at T5-6. No focal lytic or blastic lesions are evident.  Review of the MIP images confirms the above findings.  IMPRESSION: 1. No evidence for pulmonary embolus. Suboptimal contrast bolus degrades evaluation of distal segmental arteries. No proximal segmental or lobar embolus is evident. 2. Mild dependent atelectasis without significant airspace disease. 3. Diffuse fatty infiltration of the liver.   Electronically Signed   By: Gennette Pac   On: 03/17/2013 12:18   US Abdomen Limited Ruq  03/17/2013   CLINICAL DATA:  Right upper quadrant abdominal pain.  EXAM: US ABDOMEN LIMITED - RIGHT UPPER QUADRANT  COMPARISON:  No  prior ultrasound. Visualized upper abdomen on CTA chest earlier same date.  FINDINGS: Technically difficult examination due to patient body habitus and the fact that she was somnolent and unable to turn into the lateral decubitus position.  Gallbladder:  Contracted gallbladder containing numerous shadowing gallstones. Borderline gallbladder wall thickness at 3 mm. No pericholecystic fluid. Sonographic Murphy sign not obtainable. The gallbladder was not imaged on the earlier CTA chest.  Common bile duct  Diameter: 5 mm proximally. Distal duct obscured by duodenum bowel gas.  Liver:  Mild hepatomegaly. Diffusely increased and coarsened echotexture without focal parenchymal abnormality. Patent portal vein with hepatopetal flow.  IMPRESSION: 1. Contracted gallbladder containing numerous gallstones. 2. Mild hepatomegaly. Diffuse hepatic steatosis and or hepatocellular disease without focal hepatic parenchymal abnormality.    Electronically Signed   By: Hulan Saas   On: 03/17/2013 19:38    Scheduled Meds: . enoxaparin (LOVENOX) injection  40 mg Subcutaneous Q24H  . pantoprazole (PROTONIX) IV  40 mg Intravenous Q12H  . piperacillin-tazobactam (ZOSYN)  IV  3.375 g Intravenous Q8H  . vancomycin  1,250 mg Intravenous Q12H   Continuous Infusions: . sodium chloride 75 mL/hr at 03/17/13 2018   Assessment: Principal Problem:   Cellulitis of leg, right Active Problems:   Obesity   Chest pain   Nausea & vomiting   Acute renal insufficiency   Cholelithiasis   Abdominal pain, right upper quadrant   1. Cellulitis, right leg. She is still febrile. Her white blood cell count is trending downward, but still is elevated. Blood cultures are negative to date. Erythema has subsided a little compared to yesterday. We'll continue vancomycin and Zosyn. Right lower extremity venous ultrasound pending to rule out DVT.  Chest pain. Atypical. Myocardial infarction ruled out. 2-D echocardiogram revealed no wall motion abnormalities, preserved LV systolic function and grade 1 diastolic dysfunction. CT angiogram negative for PE. Consider GI or hepatobiliary association or etiology. Continue IV Protonix.  Abdominal pain with associated nausea and vomiting. Her symptoms are subsiding. However, she does have evidence of gallstones on the ultrasound of her abdomen. Query biliary colic. Her liver transaminases and lipase are within normal limits. We'll consult general surgery.  Acute renal insufficiency. The patient's renal function/creatinine was within normal limits on admission. It has increased to 1.32. This will need to be followed as the patient is being treated with both Zosyn and vancomycin.  Hypokalemia. Repleted and supplemented in the IV fluids.   Plan: 1. Advance diet slowly to full liquids as tolerated. 2. Will ask general surgery to evaluate the patient for cholelithiasis, query biliary colic. 3. Right lower  extremity venous Doppler ordered and pending. 4. Monitor renal function closely.  Time spent: 30 minutes.    [facility]  [facility] Pager [phone]. If 7PM-7AM, please contact night-coverage at [email or web address], password [facility] 03/18/2013, 11:43 AM  LOS: 1 day

## 2013-03-18 NOTE — Progress Notes (Signed)
Notified Dr. Sherrie Mustache of the patient still continuing to have an increased temp.  I also notified her that the patients U/S results were available and appeared to be positive.  New orders were given and followed.

## 2013-03-18 NOTE — Progress Notes (Signed)
03/18/13 1915 Received call from Dr. Sherrie Mustache this evening regarding ultrasound of right lower extremity not yet completed. Spoke with Selena Batten in radiology this evening, states order correctly placed, ultrasound tech will be here in am to complete. States will leave message for them to complete in the morning. Earnstine Regal, RN

## 2013-03-19 ENCOUNTER — Inpatient Hospital Stay (HOSPITAL_COMMUNITY): Payer: 59

## 2013-03-19 DIAGNOSIS — A419 Sepsis, unspecified organism: Principal | ICD-10-CM

## 2013-03-19 LAB — CBC
HCT: 35.1 % — ABNORMAL LOW (ref 36.0–46.0)
Hemoglobin: 11.3 g/dL — ABNORMAL LOW (ref 12.0–15.0)
MCH: 28 pg (ref 26.0–34.0)
MCHC: 32.2 g/dL (ref 30.0–36.0)
MCV: 86.9 fL (ref 78.0–100.0)
RBC: 4.04 MIL/uL (ref 3.87–5.11)

## 2013-03-19 LAB — BASIC METABOLIC PANEL
BUN: 7 mg/dL (ref 6–23)
CO2: 24 mEq/L (ref 19–32)
Chloride: 104 mEq/L (ref 96–112)
GFR calc non Af Amer: 62 mL/min — ABNORMAL LOW (ref >90)
Glucose, Bld: 114 mg/dL — ABNORMAL HIGH (ref 70–99)
Potassium: 3.3 mEq/L — ABNORMAL LOW (ref 3.5–5.1)
Sodium: 136 mEq/L (ref 135–145)

## 2013-03-19 MED ORDER — POTASSIUM CHLORIDE CRYS ER 20 MEQ PO TBCR
40.0000 meq | EXTENDED_RELEASE_TABLET | Freq: Once | ORAL | Status: AC
  Administered 2013-03-19: 40 meq via ORAL
  Filled 2013-03-19: qty 2

## 2013-03-19 NOTE — Progress Notes (Signed)
Subjective: Continues to have some substernal chest pain. Still remains colicky in nature. No fevers or chills. No arm pain.  Objective: Vital signs in last 24 hours: Temp:  [99.3 F (37.4 C)-99.6 F (37.6 C)] 99.3 F (37.4 C) (09/28 0612) Pulse Rate:  [91-94] 91 (09/28 0612) Resp:  [20] 20 (09/28 0612) BP: (150)/(90) 150/90 mmHg (09/28 0612) SpO2:  [95 %] 95 % (09/28 0612) Last BM Date: 03/15/13  Intake/Output from previous day: 09/27 0701 - 09/28 0700 In: 1772.5 [P.O.:360; I.V.:1112.5; IV Piggyback:300] Out: -  Intake/Output this shift:    General appearance: alert and no distress Cardio: regular rate and rhythm GI: Positive bowel sounds, soft, mild epigastric tenderness. No peritoneal signs. No classic Murphy sign.  Lab Results:   Recent Labs  03/18/13 0500 03/19/13 0532  WBC 16.0* 10.2  HGB 11.7* 11.3*  HCT 36.4 35.1*  PLT 285 297   BMET  Recent Labs  03/18/13 0500 03/19/13 0532  NA 135 136  K 3.8 3.3*  CL 101 104  CO2 26 24  GLUCOSE 91 114*  BUN 13 7  CREATININE 1.32* 1.08  CALCIUM 8.7 8.9   PT/INR No results found for this basename: LABPROT, INR,  in the last 72 hours ABG No results found for this basename: PHART, PCO2, PO2, HCO3,  in the last 72 hours  Studies/Results: US Venous Img Lower Unilateral Right  03/19/2013   CLINICAL DATA:  Initial encounter for current episode of right lower extremity pain and swelling.  EXAM: RIGHT LOWER EXTREMITY VENOUS DOPPLER ULTRASOUND  TECHNIQUE: Gray-scale sonography with graded compression, as well as color Doppler and duplex ultrasound, were performed to evaluate the deep venous system from the level of the common femoral vein through the popliteal and proximal calf veins. Spectral Doppler was utilized to evaluate flow at rest and with distal augmentation maneuvers.  COMPARISON:  02/14/2012, 11/09/2011.  FINDINGS: Thrombus within deep veins:  None visualized.  Compressibility of deep veins:  Normal.  Duplex  waveform respiratory phasicity:  Normal.  Duplex waveform response to augmentation:  Normal.  Venous reflux:  Not evaluated.  Other findings:  None visualized.  IMPRESSION: No evidence of right lower extremity DVT.   Electronically Signed   By: Hulan Saas   On: 03/19/2013 10:35   US Abdomen Limited Ruq  03/17/2013   CLINICAL DATA:  Right upper quadrant abdominal pain.  EXAM: US ABDOMEN LIMITED - RIGHT UPPER QUADRANT  COMPARISON:  No prior ultrasound. Visualized upper abdomen on CTA chest earlier same date.  FINDINGS: Technically difficult examination due to patient body habitus and the fact that she was somnolent and unable to turn into the lateral decubitus position.  Gallbladder:  Contracted gallbladder containing numerous shadowing gallstones. Borderline gallbladder wall thickness at 3 mm. No pericholecystic fluid. Sonographic Murphy sign not obtainable. The gallbladder was not imaged on the earlier CTA chest.  Common bile duct  Diameter: 5 mm proximally. Distal duct obscured by duodenum bowel gas.  Liver:  Mild hepatomegaly. Diffusely increased and coarsened echotexture without focal parenchymal abnormality. Patent portal vein with hepatopetal flow.  IMPRESSION: 1. Contracted gallbladder containing numerous gallstones. 2. Mild hepatomegaly. Diffuse hepatic steatosis and or hepatocellular disease without focal hepatic parenchymal abnormality.   Electronically Signed   By: Hulan Saas   On: 03/17/2013 19:38    Anti-infectives: Anti-infectives   Start     Dose/Rate Route Frequency Ordered Stop   03/18/13 2000  vancomycin (VANCOCIN) 1,250 mg in sodium chloride 0.9 % 250 mL IVPB  1,250 mg 166.7 mL/hr over 90 Minutes Intravenous Every 12 hours 03/18/13 1036     03/17/13 1400  vancomycin (VANCOCIN) IVPB 1000 mg/200 mL premix  Status:  Discontinued     1,000 mg 200 mL/hr over 60 Minutes Intravenous Every 8 hours 03/17/13 0812 03/18/13 1036   03/17/13 1200  piperacillin-tazobactam (ZOSYN)  IVPB 3.375 g     3.375 g 12.5 mL/hr over 240 Minutes Intravenous Every 8 hours 03/17/13 1117     03/17/13 0430  vancomycin (VANCOCIN) IVPB 1000 mg/200 mL premix     1,000 mg 200 mL/hr over 60 Minutes Intravenous  Once 03/17/13 0419 03/17/13 0609   03/17/13 0145  vancomycin (VANCOCIN) IVPB 1000 mg/200 mL premix     1,000 mg 200 mL/hr over 60 Minutes Intravenous  Once 03/17/13 0133 03/17/13 0348      Assessment/Plan: s/p * No surgery found * Cholelithiasis. Chest pain. At this point patient's cardiac workup remains negative. She does have gallstones and this is still a possible etiology. I have again discussed the patient proceeding to the operating room for a laparoscopic cholecystectomy. As patient has no evidence of an cardiac etiology, I feel is reasonable to proceed to the operating room tomorrow. Patient will be preop'd.  LOS: 2 days    Devery Murgia C 03/19/2013

## 2013-03-19 NOTE — Progress Notes (Signed)
[facility] PROGRESS NOTE  Carrie Mitchell AVW:[id number] DOB: Oct 13, 1970 DOA: 03/17/2013 PCP: Pershing Proud    Code Status: Full code Family Communication: Discuss with husband Disposition Plan: Home when clinically appropriate.   Consultants:  General surgery.  Procedures:  2-D echocardiogram: Study Conclusions - Procedure narrative: Transthoracic echocardiography. Image quality was suboptimal. The study was technically difficult, as a result of body habitus. - Left ventricle: Wall thickness was increased in a pattern of mild LVH. Systolic function was vigorous. The estimated ejection fraction was in the range of 65% to 70%. Wall motion was normal; there were no regional wall motion abnormalities. There was an increased relative contribution of atrial contraction to ventricular filling. Doppler parameters are consistent with abnormal left ventricular relaxation (grade 1 diastolic dysfunction). - Mitral valve: Mildly calcified annulus. Mildly thickened leaflets. No significant regurgitation. - Left atrium: The atrium was mildly dilated. Transthoracic echocardiography. M-mode, complete 2D, spectral Doppler, and color Doppler. Height: Height: 175.3cm. Height: 69in. Weight: Weight: 142.9kg. Weight: 314.3lb. Body mass index: BMI: 46.5kg/m^2. Body surface area: BSA: 2.75m^2. Blood pressure: 135/67. Patient status: Inpatient. Location: Bedside.   Antibiotics:  IV vancomycin 03/17/2013>>  Zosyn 03/17/2013>>  HPI/Subjective: Patient says that she had a "rough" night.  Has continued pain in epigastrium which radiates to left side, positional, no associated shortness of breath, she does have nausea.  Objective: Filed Vitals:   03/19/13 0612  BP: 150/90  Pulse: 91  Temp: 99.3 F (37.4 C)  Resp: 20     Intake/Output Summary (Last 24 hours) at 03/19/13 0920 Last data filed at 03/19/13 0434  Gross per 24 hour  Intake 1652.5 ml  Output      0 ml  Net  1652.5 ml   Filed Weights   03/17/13 0105  Weight: 142.883 kg (315 lb)    Exam:   General:  Obese alert 42 year old African-American woman laying in bed, in no acute distress.  Cardiovascular: S1, S2, with borderline tachycardia and soft systolic murmur.  Respiratory: Decreased breath sounds in the bases but clear anteriorly.  Abdomen: Obese, positive bowel sounds, soft, mild tenderness in the epigastrium and right upper cautery without rebound or Murphy sign.  Musculoskeletal:  Right lower extremity with 1+ nonpitting edema, global erythema, warm to touch, and moderately tender. Pedal pulses palpable bilaterally. Scaly calluses on the plantar surfaces of both feet. No pedal edema on the left.  Data Reviewed: Basic Metabolic Panel:  Recent Labs Lab 03/17/13 0130 03/17/13 0515 03/18/13 0500 03/19/13 0532  NA 137  --  135 136  K 3.4*  --  3.8 3.3*  CL 99  --  101 104  CO2 25  --  26 24  GLUCOSE 119*  --  91 114*  BUN 9  --  13 7  CREATININE 0.96 1.08 1.32* 1.08  CALCIUM 9.7  --  8.7 8.9   Liver Function Tests:  Recent Labs Lab 03/17/13 0329 03/18/13 0757  AST 23 29  ALT 27 26  ALKPHOS 95 84  BILITOT 0.6 0.9  PROT 8.4* 7.2  ALBUMIN 3.8 2.8*    Recent Labs Lab 03/17/13 0338 03/18/13 0757  LIPASE 12 12   No results found for this basename: AMMONIA,  in the last 168 hours CBC:  Recent Labs Lab 03/17/13 0130 03/17/13 0515 03/18/13 0500 03/19/13 0532  WBC 20.8* 24.3* 16.0* 10.2  NEUTROABS 18.9*  --   --   --   HGB 13.6 12.5 11.7* 11.3*  HCT 40.9 37.8 36.4 35.1*  MCV  86.3 86.5 87.7 86.9  PLT 359 340 285 297   Cardiac Enzymes:  Recent Labs Lab 03/17/13 0130 03/17/13 0515 03/17/13 1019 03/17/13 1541  TROPONINI <0.30 <0.30 <0.30 <0.30   BNP (last 3 results) No results found for this basename: PROBNP,  in the last 8760 hours CBG: No results found for this basename: GLUCAP,  in the last 168 hours  Recent Results (from the past 240 hour(s))   CULTURE, BLOOD (ROUTINE X 2)     Status: None   Collection Time    03/17/13  2:03 AM      Result Value Range Status   Specimen Description BLOOD RIGHT ANTECUBITAL   Final   Special Requests     Final   Value: BOTTLES DRAWN AEROBIC AND ANAEROBIC AEB 11CC ANA 8CC   Culture NO GROWTH 1 DAY   Final   Report Status PENDING   Incomplete  CULTURE, BLOOD (ROUTINE X 2)     Status: None   Collection Time    03/17/13  2:04 AM      Result Value Range Status   Specimen Description BLOOD RIGHT ANTECUBITAL   Final   Special Requests BOTTLES DRAWN AEROBIC ONLY 10CC   Final   Culture NO GROWTH 1 DAY   Final   Report Status PENDING   Incomplete     Studies: Ct Angio Chest Pe W/cm &/or Wo Cm  03/17/2013   CLINICAL DATA:  Chest pain and shortness of breath. Question pulmonary embolus.  EXAM: CT ANGIOGRAPHY CHEST WITH CONTRAST  TECHNIQUE: Multidetector CT imaging of the chest was performed using the standard protocol during bolus administration of intravenous contrast. Multiplanar CT image reconstructions including MIPs were obtained to evaluate the vascular anatomy.  CONTRAST:  OMNIPAQUE IOHEXOL 350 MG/ML SOLN  COMPARISON:  None.  FINDINGS: Pulmonary arterial opacification is suboptimal. This degrades evaluation of distal segmental vessels. No significant proximal segmental or lobar filling defects are evident to suggest pulmonary emboli. The heart size is normal. No significant pleural or pericardial effusion is present.  Mild diffuse fatty infiltration of the liver is evident. Limited imaging of the upper abdomen is unremarkable otherwise. The thoracic inlet is within normal limits.  The lung windows demonstrate mild dependent atelectasis bilaterally. No focal nodule, mass, or airspace disease is evident.  The bone windows demonstrate a focal calcified disc at T5-6. No focal lytic or blastic lesions are evident.  Review of the MIP images confirms the above findings.  IMPRESSION: 1. No evidence for  pulmonary embolus. Suboptimal contrast bolus degrades evaluation of distal segmental arteries. No proximal segmental or lobar embolus is evident. 2. Mild dependent atelectasis without significant airspace disease. 3. Diffuse fatty infiltration of the liver.   Electronically Signed   By: Gennette Pac   On: 03/17/2013 12:18   US Abdomen Limited Ruq  03/17/2013   CLINICAL DATA:  Right upper quadrant abdominal pain.  EXAM: US ABDOMEN LIMITED - RIGHT UPPER QUADRANT  COMPARISON:  No prior ultrasound. Visualized upper abdomen on CTA chest earlier same date.  FINDINGS: Technically difficult examination due to patient body habitus and the fact that she was somnolent and unable to turn into the lateral decubitus position.  Gallbladder:  Contracted gallbladder containing numerous shadowing gallstones. Borderline gallbladder wall thickness at 3 mm. No pericholecystic fluid. Sonographic Murphy sign not obtainable. The gallbladder was not imaged on the earlier CTA chest.  Common bile duct  Diameter: 5 mm proximally. Distal duct obscured by duodenum bowel gas.  Liver:  Mild hepatomegaly. Diffusely increased and coarsened echotexture without focal parenchymal abnormality. Patent portal vein with hepatopetal flow.  IMPRESSION: 1. Contracted gallbladder containing numerous gallstones. 2. Mild hepatomegaly. Diffuse hepatic steatosis and or hepatocellular disease without focal hepatic parenchymal abnormality.   Electronically Signed   By: Hulan Saas   On: 03/17/2013 19:38    Scheduled Meds: . enoxaparin (LOVENOX) injection  40 mg Subcutaneous Q24H  . pantoprazole (PROTONIX) IV  40 mg Intravenous Q12H  . piperacillin-tazobactam (ZOSYN)  IV  3.375 g Intravenous Q8H  . vancomycin  1,250 mg Intravenous Q12H   Continuous Infusions: . 0.9 % NaCl with KCl 20 mEq / L 75 mL/hr at 03/19/13 0434   Assessment: Principal Problem:   Cellulitis of leg, right Active Problems:   Obesity   Chest pain   Nausea & vomiting    Acute renal insufficiency   Cholelithiasis   Abdominal pain, right upper quadrant   1. Cellulitis, right leg. She is afebrile.  WBC count has normalized. Blood cultures are negative to date. Erythema improving. We'll continue vancomycin and Zosyn. Right lower extremity venous ultrasound pending to rule out DVT.  Chest pain. Atypical. Myocardial infarction ruled out. 2-D echocardiogram revealed no wall motion abnormalities, preserved LV systolic function and grade 1 diastolic dysfunction. CT angiogram negative for PE. Likely hepatobiliary association or etiology. Continue IV Protonix.  Abdominal pain with associated nausea and vomiting. Her symptoms are subsiding. However, she does have evidence of gallstones on the ultrasound of her abdomen. Query biliary colic. Her liver transaminases and lipase are within normal limits. General surgery input appreciated. Patient is being considered for laparoscopic cholecystectomy on monday. She continues to have abdominal pain, nausea and vomiting.  Will not advance diet for now.  Acute renal insufficiency. Normalized with IV fluids  Hypokalemia. Repleted and supplemented in the IV fluids.   Time spent: 25 minutes.    [facility]  [facility] Pager [phone]. If 7PM-7AM, please contact night-coverage at [email or web address], password [facility] 03/19/2013, 9:20 AM  LOS: 2 days

## 2013-03-20 ENCOUNTER — Encounter (HOSPITAL_COMMUNITY): Payer: Self-pay

## 2013-03-20 ENCOUNTER — Encounter (HOSPITAL_COMMUNITY)
Admission: EM | Disposition: A | Discharge status: Home / Self Care | Payer: Self-pay | Source: Home / Self Care | Attending: Internal Medicine

## 2013-03-20 ENCOUNTER — Encounter (HOSPITAL_COMMUNITY): Payer: Self-pay | Admitting: Anesthesiology

## 2013-03-20 ENCOUNTER — Inpatient Hospital Stay (HOSPITAL_COMMUNITY): Payer: 59 | Admitting: Anesthesiology

## 2013-03-20 DIAGNOSIS — R112 Nausea with vomiting, unspecified: Secondary | ICD-10-CM

## 2013-03-20 HISTORY — PX: CHOLECYSTECTOMY: SHX55

## 2013-03-20 LAB — BASIC METABOLIC PANEL
BUN: 6 mg/dL (ref 6–23)
CO2: 25 mEq/L (ref 19–32)
Calcium: 9.4 mg/dL (ref 8.4–10.5)
GFR calc non Af Amer: 66 mL/min — ABNORMAL LOW (ref >90)
Glucose, Bld: 111 mg/dL — ABNORMAL HIGH (ref 70–99)
Sodium: 138 mEq/L (ref 135–145)

## 2013-03-20 LAB — MRSA PCR SCREENING: MRSA by PCR: NEGATIVE

## 2013-03-20 SURGERY — LAPAROSCOPIC CHOLECYSTECTOMY
Anesthesia: General | Site: Abdomen | Wound class: Clean Contaminated

## 2013-03-20 MED ORDER — CELECOXIB 100 MG PO CAPS
ORAL_CAPSULE | ORAL | Status: AC
  Filled 2013-03-20: qty 2

## 2013-03-20 MED ORDER — BUPIVACAINE HCL (PF) 0.5 % IJ SOLN
INTRAMUSCULAR | Status: DC | PRN
  Administered 2013-03-20: 15 mL

## 2013-03-20 MED ORDER — GLYCOPYRROLATE 0.2 MG/ML IJ SOLN
INTRAMUSCULAR | Status: DC | PRN
  Administered 2013-03-20: 0.6 mg via INTRAVENOUS

## 2013-03-20 MED ORDER — ONDANSETRON HCL 4 MG/2ML IJ SOLN
INTRAMUSCULAR | Status: AC
  Filled 2013-03-20: qty 2

## 2013-03-20 MED ORDER — ONDANSETRON HCL 4 MG/2ML IJ SOLN
4.0000 mg | Freq: Once | INTRAMUSCULAR | Status: AC
  Administered 2013-03-20: 4 mg via INTRAVENOUS

## 2013-03-20 MED ORDER — LACTATED RINGERS IV SOLN
INTRAVENOUS | Status: DC | PRN
  Administered 2013-03-20 (×2): via INTRAVENOUS

## 2013-03-20 MED ORDER — GLYCOPYRROLATE 0.2 MG/ML IJ SOLN
0.2000 mg | Freq: Once | INTRAMUSCULAR | Status: AC
  Administered 2013-03-20: 0.2 mg via INTRAVENOUS

## 2013-03-20 MED ORDER — FENTANYL CITRATE 0.05 MG/ML IJ SOLN
25.0000 ug | INTRAMUSCULAR | Status: DC | PRN
  Administered 2013-03-20 (×2): 50 ug via INTRAVENOUS
  Filled 2013-03-20: qty 2

## 2013-03-20 MED ORDER — SULFAMETHOXAZOLE-TMP DS 800-160 MG PO TABS: 1.0000 | ORAL_TABLET | Freq: Two times a day (BID) | ORAL | Status: DC

## 2013-03-20 MED ORDER — LIDOCAINE HCL (PF) 1 % IJ SOLN
INTRAMUSCULAR | Status: AC
  Filled 2013-03-20: qty 5

## 2013-03-20 MED ORDER — LACTATED RINGERS IV SOLN
INTRAVENOUS | Status: DC
  Administered 2013-03-20: 10:00:00 via INTRAVENOUS

## 2013-03-20 MED ORDER — FENTANYL CITRATE 0.05 MG/ML IJ SOLN
INTRAMUSCULAR | Status: DC | PRN
  Administered 2013-03-20 (×2): 100 ug via INTRAVENOUS
  Administered 2013-03-20 (×3): 50 ug via INTRAVENOUS

## 2013-03-20 MED ORDER — SUCCINYLCHOLINE CHLORIDE 20 MG/ML IJ SOLN
INTRAMUSCULAR | Status: DC | PRN
  Administered 2013-03-20: 120 mg via INTRAVENOUS

## 2013-03-20 MED ORDER — ROCURONIUM BROMIDE 50 MG/5ML IV SOLN
INTRAVENOUS | Status: AC
  Filled 2013-03-20: qty 1

## 2013-03-20 MED ORDER — SODIUM CHLORIDE 0.9 % IR SOLN
Status: DC | PRN
  Administered 2013-03-20: 1000 mL

## 2013-03-20 MED ORDER — HYDROCODONE-ACETAMINOPHEN 5-325 MG PO TABS: 1.0000 | ORAL_TABLET | ORAL | Status: DC | PRN

## 2013-03-20 MED ORDER — PROPOFOL 10 MG/ML IV BOLUS
INTRAVENOUS | Status: DC | PRN
  Administered 2013-03-20: 180 mg via INTRAVENOUS

## 2013-03-20 MED ORDER — BUPIVACAINE HCL (PF) 0.5 % IJ SOLN
INTRAMUSCULAR | Status: AC
  Filled 2013-03-20: qty 30

## 2013-03-20 MED ORDER — PANTOPRAZOLE SODIUM 40 MG PO TBEC: 40.0000 mg | DELAYED_RELEASE_TABLET | Freq: Every day | ORAL | Status: DC

## 2013-03-20 MED ORDER — PROPOFOL 10 MG/ML IV EMUL
INTRAVENOUS | Status: AC
  Filled 2013-03-20: qty 20

## 2013-03-20 MED ORDER — HYDROMORPHONE HCL PF 1 MG/ML IJ SOLN: 0.5000 mg | INTRAMUSCULAR | Status: DC | PRN

## 2013-03-20 MED ORDER — MIDAZOLAM HCL 2 MG/2ML IJ SOLN
INTRAMUSCULAR | Status: AC
  Filled 2013-03-20: qty 2

## 2013-03-20 MED ORDER — MIDAZOLAM HCL 2 MG/2ML IJ SOLN
1.0000 mg | INTRAMUSCULAR | Status: DC | PRN
Start: 2013-03-20 — End: 2013-03-20
  Administered 2013-03-20: 2 mg via INTRAVENOUS

## 2013-03-20 MED ORDER — ROCURONIUM BROMIDE 100 MG/10ML IV SOLN
INTRAVENOUS | Status: DC | PRN
  Administered 2013-03-20: 10 mg via INTRAVENOUS
  Administered 2013-03-20: 30 mg via INTRAVENOUS
  Administered 2013-03-20: 5 mg via INTRAVENOUS

## 2013-03-20 MED ORDER — DEXAMETHASONE SODIUM PHOSPHATE 4 MG/ML IJ SOLN
4.0000 mg | Freq: Once | INTRAMUSCULAR | Status: AC
  Administered 2013-03-20: 4 mg via INTRAVENOUS
  Filled 2013-03-20: qty 1

## 2013-03-20 MED ORDER — SUCCINYLCHOLINE CHLORIDE 20 MG/ML IJ SOLN
INTRAMUSCULAR | Status: AC
  Filled 2013-03-20: qty 1

## 2013-03-20 MED ORDER — GLYCOPYRROLATE 0.2 MG/ML IJ SOLN
INTRAMUSCULAR | Status: AC
  Filled 2013-03-20: qty 1

## 2013-03-20 MED ORDER — LIDOCAINE HCL (CARDIAC) 20 MG/ML IV SOLN
INTRAVENOUS | Status: DC | PRN
  Administered 2013-03-20: 50 mg via INTRAVENOUS

## 2013-03-20 MED ORDER — ONDANSETRON HCL 4 MG/2ML IJ SOLN: 4.0000 mg | Freq: Once | INTRAMUSCULAR | Status: DC | PRN

## 2013-03-20 MED ORDER — CELECOXIB 100 MG PO CAPS
200.0000 mg | ORAL_CAPSULE | Freq: Two times a day (BID) | ORAL | Status: DC
  Administered 2013-03-20: 200 mg via ORAL

## 2013-03-20 MED ORDER — FENTANYL CITRATE 0.05 MG/ML IJ SOLN
INTRAMUSCULAR | Status: AC
  Filled 2013-03-20: qty 5

## 2013-03-20 MED ORDER — FENTANYL CITRATE 0.05 MG/ML IJ SOLN
INTRAMUSCULAR | Status: AC
  Filled 2013-03-20: qty 2

## 2013-03-20 MED ORDER — NEOSTIGMINE METHYLSULFATE 1 MG/ML IJ SOLN
INTRAMUSCULAR | Status: DC | PRN
  Administered 2013-03-20: 4 mg via INTRAVENOUS

## 2013-03-20 MED ORDER — GLYCOPYRROLATE 0.2 MG/ML IJ SOLN
INTRAMUSCULAR | Status: AC
  Filled 2013-03-20: qty 3

## 2013-03-20 SURGICAL SUPPLY — 44 items
APPLIER CLIP 5 13 M/L LIGAMAX5 (MISCELLANEOUS) ×2
BAG HAMPER (MISCELLANEOUS) ×2 IMPLANT
BENZOIN TINCTURE PRP APPL 2/3 (GAUZE/BANDAGES/DRESSINGS) ×2 IMPLANT
CLIP APPLIE 5 13 M/L LIGAMAX5 (MISCELLANEOUS) ×1 IMPLANT
CLOTH BEACON ORANGE TIMEOUT ST (SAFETY) ×2 IMPLANT
COVER LIGHT HANDLE STERIS (MISCELLANEOUS) ×4 IMPLANT
CUTTER LINEAR ENDO 35 ETS TH (STAPLE) ×2 IMPLANT
DECANTER SPIKE VIAL GLASS SM (MISCELLANEOUS) ×2 IMPLANT
DEVICE TROCAR PUNCTURE CLOSURE (ENDOMECHANICALS) ×2 IMPLANT
DURAPREP 26ML APPLICATOR (WOUND CARE) ×2 IMPLANT
ELECT REM PT RETURN 9FT ADLT (ELECTROSURGICAL) ×2
ELECTRODE REM PT RTRN 9FT ADLT (ELECTROSURGICAL) ×1 IMPLANT
FILTER SMOKE EVAC LAPAROSHD (FILTER) ×2 IMPLANT
FORMALIN 10 PREFIL 120ML (MISCELLANEOUS) ×2 IMPLANT
GLOVE BIOGEL PI IND STRL 7.0 (GLOVE) ×2 IMPLANT
GLOVE BIOGEL PI IND STRL 7.5 (GLOVE) ×1 IMPLANT
GLOVE BIOGEL PI INDICATOR 7.0 (GLOVE) ×2
GLOVE BIOGEL PI INDICATOR 7.5 (GLOVE) ×1
GLOVE ECLIPSE 6.5 STRL STRAW (GLOVE) ×2 IMPLANT
GLOVE ECLIPSE 7.0 STRL STRAW (GLOVE) ×2 IMPLANT
GLOVE EXAM NITRILE MD LF STRL (GLOVE) ×2 IMPLANT
GLOVE SS BIOGEL STRL SZ 6.5 (GLOVE) ×1 IMPLANT
GLOVE SUPERSENSE BIOGEL SZ 6.5 (GLOVE) ×1
GOWN STRL REIN XL XLG (GOWN DISPOSABLE) ×6 IMPLANT
HEMOSTAT SNOW SURGICEL 2X4 (HEMOSTASIS) ×2 IMPLANT
INST SET LAPROSCOPIC AP (KITS) ×2 IMPLANT
IV NS IRRIG 3000ML ARTHROMATIC (IV SOLUTION) IMPLANT
KIT ROOM TURNOVER APOR (KITS) ×2 IMPLANT
MANIFOLD NEPTUNE II (INSTRUMENTS) ×2 IMPLANT
NEEDLE INSUFFLATION 14GA 120MM (NEEDLE) ×2 IMPLANT
PACK LAP CHOLE LZT030E (CUSTOM PROCEDURE TRAY) ×2 IMPLANT
PAD ARMBOARD 7.5X6 YLW CONV (MISCELLANEOUS) ×2 IMPLANT
POUCH SPECIMEN RETRIEVAL 10MM (ENDOMECHANICALS) ×2 IMPLANT
SET BASIN LINEN APH (SET/KITS/TRAYS/PACK) ×2 IMPLANT
SET TUBE IRRIG SUCTION NO TIP (IRRIGATION / IRRIGATOR) IMPLANT
SLEEVE ENDOPATH XCEL 5M (ENDOMECHANICALS) ×4 IMPLANT
STRIP CLOSURE SKIN 1/2X4 (GAUZE/BANDAGES/DRESSINGS) ×2 IMPLANT
STRIP CLOSURE SKIN 1/4X3 (GAUZE/BANDAGES/DRESSINGS) ×4 IMPLANT
SUT MNCRL AB 4-0 PS2 18 (SUTURE) ×4 IMPLANT
SUT VIC AB 2-0 CT2 27 (SUTURE) ×4 IMPLANT
TROCAR ENDO BLADELESS 11MM (ENDOMECHANICALS) ×6 IMPLANT
TROCAR XCEL NON-BLD 5MMX100MML (ENDOMECHANICALS) ×2 IMPLANT
TUBING INSUFFLATION (TUBING) ×2 IMPLANT
WARMER LAPAROSCOPE (MISCELLANEOUS) ×2 IMPLANT

## 2013-03-20 NOTE — Progress Notes (Signed)
Dr ziegler said ok to give celebrex  200mg  now.

## 2013-03-20 NOTE — Anesthesia Preprocedure Evaluation (Signed)
Anesthesia Evaluation  Patient identified by MRN, date of birth, ID band Patient awake    Reviewed: Allergy & Precautions, H&P , NPO status , Patient's Chart, lab work & pertinent test results  Airway Mallampati: III TM Distance: >3 FB Neck ROM: Full    Dental  (+) Edentulous Upper   Pulmonary neg pulmonary ROS,  breath sounds clear to auscultation        Cardiovascular negative cardio ROS  Rhythm:Regular Rate:Normal     Neuro/Psych    GI/Hepatic GERD-  ,  Endo/Other  Morbid obesity  Renal/GU Renal InsufficiencyRenal disease     Musculoskeletal   Abdominal   Peds  Hematology   Anesthesia Other Findings   Reproductive/Obstetrics                           Anesthesia Physical Anesthesia Plan  ASA: II  Anesthesia Plan: General   Post-op Pain Management:    Induction: Intravenous, Rapid sequence and Cricoid pressure planned  Airway Management Planned: Oral ETT  Additional Equipment:   Intra-op Plan:   Post-operative Plan: Extubation in OR  Informed Consent: I have reviewed the patients History and Physical, chart, labs and discussed the procedure including the risks, benefits and alternatives for the proposed anesthesia with the patient or authorized representative who has indicated his/her understanding and acceptance.     Plan Discussed with:   Anesthesia Plan Comments:         Anesthesia Quick Evaluation

## 2013-03-20 NOTE — Discharge Planning (Signed)
Discussed with Dr. Kerry Hough  Rather patient would still need a PICC line or not.  I asked him if the patient still needed to get IV antibiotics.  The patient has lost IV access.  MD stated that the patients antibiotics could be changed to po.  We discussed the patients progress after the surgery and I voice to him that the patient was doing well.  She has had minimal c/o pain, has been OOB to the BR, DDI, and no immediate complaints or concerns voiced at this time, other than to have a regular diet.  I will encourage her to ambulate in the halls.

## 2013-03-20 NOTE — Preoperative (Signed)
Beta Blockers   Reason not to administer Beta Blockers:Not Applicable 

## 2013-03-20 NOTE — Anesthesia Procedure Notes (Signed)
Procedure Name: Intubation Date/Time: 03/20/2013 10:42 AM Performed by: Carolyne Littles, AMY L Pre-anesthesia Checklist: Patient identified, Patient being monitored, Timeout performed, Emergency Drugs available and Suction available Patient Re-evaluated:Patient Re-evaluated prior to inductionOxygen Delivery Method: Circle System Utilized Preoxygenation: Pre-oxygenation with 100% oxygen Intubation Type: IV induction, Rapid sequence and Cricoid Pressure applied Laryngoscope Size: 3 and Miller Grade View: Grade I Tube type: Oral Tube size: 7.0 mm Number of attempts: 1 Airway Equipment and Method: stylet and Video-laryngoscopy Placement Confirmation: ETT inserted through vocal cords under direct vision,  positive ETCO2 and breath sounds checked- equal and bilateral Secured at: 21 cm Tube secured with: Tape Dental Injury: Teeth and Oropharynx as per pre-operative assessment

## 2013-03-20 NOTE — Transfer of Care (Signed)
Immediate Anesthesia Transfer of Care Note  Patient: Carrie Mitchell  Procedure(s) Performed: Procedure(s): LAPAROSCOPIC CHOLECYSTECTOMY (N/A)  Patient Location: PACU  Anesthesia Type:General  Level of Consciousness: sedated and patient cooperative  Airway & Oxygen Therapy: Patient Spontanous Breathing and Patient connected to face mask oxygen  Post-op Assessment: Report given to PACU RN and Post -op Vital signs reviewed and stable  Post vital signs: Reviewed and stable  Complications: No apparent anesthesia complications

## 2013-03-20 NOTE — Op Note (Signed)
Patient:  Carrie Mitchell  DOB:  [birth year]  MRN:  [id number]   Preop Diagnosis:  Acute cholecystitis/cholelithiasis   Postop Diagnosis:  The same  Procedure:  Laparoscopic cholecystectomy  Surgeon:  Dr. Tilford Pillar  Anes:  General endotracheal, 0.5% Sensorcaine plain for local  Indications:  Patient is a 42 year old female presented to [facility] with chest pain. Workup and evaluation was negative for cardiac etiology. Gallbladder etiology was suspected. Risks benefits alternatives a laparoscopic possible open cholecystectomy were discussed at length patient including but not limited to risk of bleeding, infection, bile leak, small bowel injury, common bile duct injury, intraoperative cardiac component events. Patient's questions and concerns are addressed the patient as consented for the planned procedure.  Procedure note:  Patient is taken to the operating room was placed in supine position the or table time the general anesthetic is a Optician, dispensing. Once patient was asleep she symmetrically intubated by the nurse anesthetist. At this point her abdomen is prepped with DuraPrep solution and draped in standard fashion. Time out was performed. Stab incision was created supraumbilically with 11 blade scalpel with additional dissection down to subcuticular tissue carried out using a Coker clamp which is utilized to grab the anterior, fascia and lift this anteriorly. Veress needle is inserted saline drop test is utilized confirm intraperitoneal placement.  An 11 mm was inserted over laparoscope allowing visualization the trocar entering into the peritoneal cavity. The inner cannulas then removed the laparoscope was reinserted. There is no evidence a trocar peritoneal placement injury. At this time the remaining trochars replaced a 5 mm in the epigastrium, 5 mm in the midline, 5 monitored for the right lower abdominal wall. Patient's placed into a reverse Trendelenburg left lateral decubitus  position. The omental adhesions were bluntly stripped off of the infundibulum and the body of the gallbladder. The gallbladder is grasped and lifted up and over the right lobe the liver. Peritoneal dissection is carried out with a [place] dissector to strip the peritoneum off of the infundibulum of the gallbladder exposing both the cystic duct and cystic arteries he entered into the infundibulum. A window was created behind the cystic duct and cystic artery. 2 endoclips placed proximally one distally and the cystic artery and cystic arteries divided between 2 most distal clips. Inspection the cystic duct demonstrated this to be quite dilated. Due to its size I do not feel that the endoclips at adequately ligate the cystic duct and therefore opted to bring a 35 Endo GIA vascular stapler load to the field. And order to facilitate placement 11 mm was then substituted for the previous 5 mm epigastric trocar. Upon dividing the cystic duct with the Endo GIA the gallbladder was then dissected off of the gallbladder fossa using electrocautery. Once the gallbladder is free is placed into an Endo Catch bag and placed into the right upper quadrant over the liver. Inspection the gallbladder fossa indicate excellent hemostasis. The endoclips and the divided cystic duct were inspected. There is no evidence of any bleeding or bile leak. At this time attention was turned to closure.  Using an Endo Close suture passing device a 2-0 Vicryl sutures passed to the umbilical and epigastric trocar sites. With the sutures in place the gallbladder is retrieved and was removed the umbilical trocar site in an intact Endo Catch bag. It is placed in the back table and sent as a perm specimen to pathology. At this time the pneumoperitoneum was evacuated. Trochars were removed. The Vicryl sutures were secured.  Local anesthetic is instilled. A 4 Monocryl utilized reapproximate the skin edges at all 4 trocar sites. The skin was washed dried  moist dry towel. Benzoin is applied around the incisions. Half-inch are suture placed. The drapes removed patient left come out of general anesthetic and the patient stretcher back in stable condition. At the conclusion of procedure all instrument, sponge, needle counts are correct. Patient tolerated procedure extremely well.  Complications:  None apparent  EBL:  Minimal  Specimen:  Gallbladder

## 2013-03-20 NOTE — Progress Notes (Signed)
Day of Surgery  Subjective: Still with CP, epigastric pain.  NO new changes.  Objective: Vital signs in last 24 hours: Temp:  [98.3 F (36.8 C)-99 F (37.2 C)] 98.3 F (36.8 C) (09/29 0917) Pulse Rate:  [72-83] 80 (09/29 0917) Resp:  [11-22] 16 (09/29 1005) BP: (120-150)/(79-98) 140/97 mmHg (09/29 1005) SpO2:  [94 %-99 %] 94 % (09/29 1005) Last BM Date: 03/15/13  Intake/Output from previous day:   Intake/Output this shift:    General appearance: alert and no distress GI: Soft, moderate tenderness.    Lab Results:   Recent Labs  03/18/13 0500 03/19/13 0532  WBC 16.0* 10.2  HGB 11.7* 11.3*  HCT 36.4 35.1*  PLT 285 297   BMET  Recent Labs  03/19/13 0532 03/20/13 0607  NA 136 138  K 3.3* 4.1  CL 104 104  CO2 24 25  GLUCOSE 114* 111*  BUN 7 6  CREATININE 1.08 1.03  CALCIUM 8.9 9.4   PT/INR No results found for this basename: LABPROT, INR,  in the last 72 hours ABG No results found for this basename: PHART, PCO2, PO2, HCO3,  in the last 72 hours  Studies/Results: US Venous Img Lower Unilateral Right  03/19/2013   CLINICAL DATA:  Initial encounter for current episode of right lower extremity pain and swelling.  EXAM: RIGHT LOWER EXTREMITY VENOUS DOPPLER ULTRASOUND  TECHNIQUE: Gray-scale sonography with graded compression, as well as color Doppler and duplex ultrasound, were performed to evaluate the deep venous system from the level of the common femoral vein through the popliteal and proximal calf veins. Spectral Doppler was utilized to evaluate flow at rest and with distal augmentation maneuvers.  COMPARISON:  02/14/2012, 11/09/2011.  FINDINGS: Thrombus within deep veins:  None visualized.  Compressibility of deep veins:  Normal.  Duplex waveform respiratory phasicity:  Normal.  Duplex waveform response to augmentation:  Normal.  Venous reflux:  Not evaluated.  Other findings:  None visualized.  IMPRESSION: No evidence of right lower extremity DVT.   Electronically  Signed   By: Hulan Saas   On: 03/19/2013 10:35    Anti-infectives: Anti-infectives   Start     Dose/Rate Route Frequency Ordered Stop   03/18/13 2000  vancomycin (VANCOCIN) 1,250 mg in sodium chloride 0.9 % 250 mL IVPB     1,250 mg 166.7 mL/hr over 90 Minutes Intravenous Every 12 hours 03/18/13 1036     03/17/13 1400  vancomycin (VANCOCIN) IVPB 1000 mg/200 mL premix  Status:  Discontinued     1,000 mg 200 mL/hr over 60 Minutes Intravenous Every 8 hours 03/17/13 0812 03/18/13 1036   03/17/13 1200  piperacillin-tazobactam (ZOSYN) IVPB 3.375 g     3.375 g 12.5 mL/hr over 240 Minutes Intravenous Every 8 hours 03/17/13 1117     03/17/13 0430  vancomycin (VANCOCIN) IVPB 1000 mg/200 mL premix     1,000 mg 200 mL/hr over 60 Minutes Intravenous  Once 03/17/13 0419 03/17/13 0609   03/17/13 0145  vancomycin (VANCOCIN) IVPB 1000 mg/200 mL premix     1,000 mg 200 mL/hr over 60 Minutes Intravenous  Once 03/17/13 0133 03/17/13 0348      Assessment/Plan: s/p Procedure(s): LAPAROSCOPIC CHOLECYSTECTOMY (N/A) Cholelithiasis.  Proceed to OR as discussed.  Risks, benefits, alternatives again discussed.    LOS: 3 days    Aadi Bordner C 03/20/2013

## 2013-03-20 NOTE — Anesthesia Postprocedure Evaluation (Signed)
  Anesthesia Post-op Note  Patient: Carrie Mitchell  Procedure(s) Performed: Procedure(s): LAPAROSCOPIC CHOLECYSTECTOMY (N/A)  Patient Location: PACU  Anesthesia Type:General  Level of Consciousness: awake, oriented and patient cooperative  Airway and Oxygen Therapy: Patient Spontanous Breathing and Patient connected to face mask oxygen  Post-op Pain: mild  Post-op Assessment: Post-op Vital signs reviewed, Patient's Cardiovascular Status Stable, Respiratory Function Stable, Patent Airway, No signs of Nausea or vomiting and Pain level controlled  Post-op Vital Signs: Reviewed and stable  Complications: No apparent anesthesia complications

## 2013-03-20 NOTE — Progress Notes (Signed)
Pt stated she ate her ice cream and drank her soda which is about 25%.  She states she did not experience pain, nausea, or vomiting after.  She voices that she would like to have regular food. Diet advanced as ordered.  She demonstrated teach bach by verbalizing to me she is to call me with any issues that she may have with eating a regular diet at dinner.

## 2013-03-20 NOTE — Progress Notes (Signed)
Notified Tersea of the OR that there is consult for anesthesia.  She verbalized understanding.

## 2013-03-20 NOTE — Progress Notes (Signed)
Spoke to [employer], Teacher, early years/pre, about Demerol and Versed. He reported that they both are compatible with Vancomycin.

## 2013-03-21 NOTE — Anesthesia Postprocedure Evaluation (Signed)
  Anesthesia Post-op Note  Patient: Carrie Mitchell  Procedure(s) Performed: Procedure(s): LAPAROSCOPIC CHOLECYSTECTOMY (N/A)  Patient Location: room 336  Anesthesia Type:General  Level of Consciousness: awake, alert , oriented and patient cooperative  Airway and Oxygen Therapy: Patient Spontanous Breathing  Post-op Pain: 2 /10, mild  Post-op Assessment: Post-op Vital signs reviewed, Patient's Cardiovascular Status Stable, Respiratory Function Stable, Patent Airway, No signs of Nausea or vomiting, Adequate PO intake and Pain level controlled  Post-op Vital Signs: Reviewed and stable  Complications: No apparent anesthesia complications

## 2013-03-21 NOTE — Discharge Summary (Addendum)
Physician Discharge Summary  Carrie Mitchell NFA:[id number] DOB: [birth year] DOA: 03/17/2013  PCP: Pershing Proud  Admit date: 03/17/2013 Discharge date: 03/20/2013  Time spent: 35 minutes  Recommendations for Outpatient Follow-up:  1. Follow up with Dr. Leticia Penna in 2 weeks  Discharge Diagnoses:  Principal Problem:   Cellulitis of leg, right Active Problems:   Obesity   Chest pain   Nausea & vomiting   Acute renal insufficiency   Cholelithiasis   Abdominal pain, right upper quadrant   Discharge Condition: improved  Diet recommendation: low salt  Filed Weights   03/17/13 0105  Weight: 142.883 kg (315 lb)    History of present illness:  41 year old female with a history of obesity, dermatitis came to the ED with complaints of fever, right leg and chest pain which started around 7 PM last night. Patient also complained of myalgias. As per patient, the onset of the pain was sudden. Regarding chest pain, it is located under the left breast, at this time it is down to 5 / 10 in intensity, first set of cardiac enzymes is negative EKG shows T-wave inversions in the lateral and the inferior leads. Patient does not have a history of CAD. No history of DVT in the past. The pain is associated with nausea and vomiting. No history of gallstones, denies alcohol abuse. She also admits to having some dysuria. In the ED patient was found to have elevated white count, fever and has been given vancomycin for cellulitis. Pain in the right leg is 7 / 10 in intensity.   Hospital Course:  This lady was admitted with fevers, pain in her right leg as well as her chest. She's had her right lower extremity cellulitis and started empirically on antibiotics. She met criteria for sepsis. With IV antibiotics her cellulitis quickly improved and she was transitioned to oral Bactrim. Regarding her chest pain, she had an extensive workup and it did not appear that this was cardiac in origin. It was felt that  this likely had a hepatobiliary source. She was seen by general surgery who recommended cholecystectomy. Patient had this procedure done without any immediate complications. After the procedure, she was observed and was tolerating a regular diet. She was cleared for discharge by general surgery. She will followup with them in 2 weeks.  Procedures: Laparoscopic cholecystectomy   Consultations:  General Surgery, Dr. Leticia Penna  Discharge Exam: Filed Vitals:   03/20/13 1759  BP: 121/84  Pulse: 78  Temp: 98.6 F (37 C)  Resp: 18    General: NAD Cardiovascular: S1, S2 RRR Respiratory: CTA B  Discharge Instructions  Discharge Orders   Future Orders Complete By Expires   Call MD for:  persistant nausea and vomiting  As directed    Call MD for:  redness, tenderness, or signs of infection (pain, swelling, redness, odor or green/yellow discharge around incision site)  As directed    Call MD for:  severe uncontrolled pain  As directed    Call MD for:  temperature >100.4  As directed    Diet - low sodium heart healthy  As directed    Increase activity slowly  As directed        Medication List         HYDROcodone-acetaminophen 5-325 MG per tablet  Commonly known as:  NORCO/VICODIN  Take 1-2 tablets by mouth every 4 (four) hours as needed.     pantoprazole 40 MG tablet  Commonly known as:  PROTONIX  Take 1 tablet (40 mg  total) by mouth daily.     sulfamethoxazole-trimethoprim 800-160 MG per tablet  Commonly known as:  BACTRIM DS  Take 1 tablet by mouth 2 (two) times daily.       No Known Allergies     Follow-up Information   Follow up with ZIEGLER,BRENT C, MD. Schedule an appointment as soon as possible for a visit in 2 weeks.   Specialty:  General Surgery   Contact information:   [address] [place] [address] [phone]       Follow up with Terie Purser, [name]. Schedule an appointment as soon as possible for a visit in 2 weeks.   Specialty:   Family Medicine   Contact information:   [address] [place] [address] [phone]        The results of significant diagnostics from this hospitalization (including imaging, microbiology, ancillary and laboratory) are listed below for reference.    Significant Diagnostic Studies: Dg Chest 2 View  03/17/2013   CLINICAL DATA:  Chest pain and fever.  EXAM: CHEST  2 VIEW  COMPARISON:  PA and lateral chest 05/03/2012.  FINDINGS: Heart size and mediastinal contours are within normal limits. Both lungs are clear. Visualized skeletal structures are unremarkable.  IMPRESSION: No acute disease.   Electronically Signed   By: Drusilla Kanner M.D.   On: 03/17/2013 02:21   Ct Angio Chest Pe W/cm &/or Wo Cm  03/17/2013   CLINICAL DATA:  Chest pain and shortness of breath. Question pulmonary embolus.  EXAM: CT ANGIOGRAPHY CHEST WITH CONTRAST  TECHNIQUE: Multidetector CT imaging of the chest was performed using the standard protocol during bolus administration of intravenous contrast. Multiplanar CT image reconstructions including MIPs were obtained to evaluate the vascular anatomy.  CONTRAST:  OMNIPAQUE IOHEXOL 350 MG/ML SOLN  COMPARISON:  None.  FINDINGS: Pulmonary arterial opacification is suboptimal. This degrades evaluation of distal segmental vessels. No significant proximal segmental or lobar filling defects are evident to suggest pulmonary emboli. The heart size is normal. No significant pleural or pericardial effusion is present.  Mild diffuse fatty infiltration of the liver is evident. Limited imaging of the upper abdomen is unremarkable otherwise. The thoracic inlet is within normal limits.  The lung windows demonstrate mild dependent atelectasis bilaterally. No focal nodule, mass, or airspace disease is evident.  The bone windows demonstrate a focal calcified disc at T5-6. No focal lytic or blastic lesions are evident.  Review of the MIP images confirms the above findings.   IMPRESSION: 1. No evidence for pulmonary embolus. Suboptimal contrast bolus degrades evaluation of distal segmental arteries. No proximal segmental or lobar embolus is evident. 2. Mild dependent atelectasis without significant airspace disease. 3. Diffuse fatty infiltration of the liver.   Electronically Signed   By: Gennette Pac   On: 03/17/2013 12:18   US Venous Img Lower Unilateral Right  03/19/2013   CLINICAL DATA:  Initial encounter for current episode of right lower extremity pain and swelling.  EXAM: RIGHT LOWER EXTREMITY VENOUS DOPPLER ULTRASOUND  TECHNIQUE: Gray-scale sonography with graded compression, as well as color Doppler and duplex ultrasound, were performed to evaluate the deep venous system from the level of the common femoral vein through the popliteal and proximal calf veins. Spectral Doppler was utilized to evaluate flow at rest and with distal augmentation maneuvers.  COMPARISON:  02/14/2012, 11/09/2011.  FINDINGS: Thrombus within deep veins:  None visualized.  Compressibility of deep veins:  Normal.  Duplex waveform respiratory phasicity:  Normal.  Duplex waveform response  to augmentation:  Normal.  Venous reflux:  Not evaluated.  Other findings:  None visualized.  IMPRESSION: No evidence of right lower extremity DVT.   Electronically Signed   By: Hulan Saas   On: 03/19/2013 10:35   US Abdomen Limited Ruq  03/17/2013   CLINICAL DATA:  Right upper quadrant abdominal pain.  EXAM: US ABDOMEN LIMITED - RIGHT UPPER QUADRANT  COMPARISON:  No prior ultrasound. Visualized upper abdomen on CTA chest earlier same date.  FINDINGS: Technically difficult examination due to patient body habitus and the fact that she was somnolent and unable to turn into the lateral decubitus position.  Gallbladder:  Contracted gallbladder containing numerous shadowing gallstones. Borderline gallbladder wall thickness at 3 mm. No pericholecystic fluid. Sonographic Murphy sign not obtainable. The gallbladder was  not imaged on the earlier CTA chest.  Common bile duct  Diameter: 5 mm proximally. Distal duct obscured by duodenum bowel gas.  Liver:  Mild hepatomegaly. Diffusely increased and coarsened echotexture without focal parenchymal abnormality. Patent portal vein with hepatopetal flow.  IMPRESSION: 1. Contracted gallbladder containing numerous gallstones. 2. Mild hepatomegaly. Diffuse hepatic steatosis and or hepatocellular disease without focal hepatic parenchymal abnormality.   Electronically Signed   By: Hulan Saas   On: 03/17/2013 19:38    Microbiology: Recent Results (from the past 240 hour(s))  CULTURE, BLOOD (ROUTINE X 2)     Status: None   Collection Time    03/17/13  2:03 AM      Result Value Range Status   Specimen Description BLOOD RIGHT ANTECUBITAL   Final   Special Requests     Final   Value: BOTTLES DRAWN AEROBIC AND ANAEROBIC AEB 11CC ANA 8CC   Culture NO GROWTH 3 DAYS   Final   Report Status PENDING   Incomplete  CULTURE, BLOOD (ROUTINE X 2)     Status: None   Collection Time    03/17/13  2:04 AM      Result Value Range Status   Specimen Description BLOOD RIGHT ANTECUBITAL   Final   Special Requests BOTTLES DRAWN AEROBIC ONLY 10CC   Final   Culture NO GROWTH 3 DAYS   Final   Report Status PENDING   Incomplete  MRSA PCR SCREENING     Status: None   Collection Time    03/20/13  2:30 AM      Result Value Range Status   MRSA by PCR NEGATIVE  NEGATIVE Final   Comment:            The GeneXpert MRSA Assay (FDA     approved for NASAL specimens     only), is one component of a     comprehensive MRSA colonization     surveillance program. It is not     intended to diagnose MRSA     infection nor to guide or     monitor treatment for     MRSA infections.     Labs: Basic Metabolic Panel:  Recent Labs Lab 03/17/13 0130 03/17/13 0515 03/18/13 0500 03/19/13 0532 03/20/13 0607  NA 137  --  135 136 138  K 3.4*  --  3.8 3.3* 4.1  CL 99  --  101 104 104  CO2 25  --   26 24 25   GLUCOSE 119*  --  91 114* 111*  BUN 9  --  13 7 6   CREATININE 0.96 1.08 1.32* 1.08 1.03  CALCIUM 9.7  --  8.7 8.9 9.4   Liver Function  Tests:  Recent Labs Lab 03/17/13 0329 03/18/13 0757  AST 23 29  ALT 27 26  ALKPHOS 95 84  BILITOT 0.6 0.9  PROT 8.4* 7.2  ALBUMIN 3.8 2.8*    Recent Labs Lab 03/17/13 0338 03/18/13 0757  LIPASE 12 12   No results found for this basename: AMMONIA,  in the last 168 hours CBC:  Recent Labs Lab 03/17/13 0130 03/17/13 0515 03/18/13 0500 03/19/13 0532  WBC 20.8* 24.3* 16.0* 10.2  NEUTROABS 18.9*  --   --   --   HGB 13.6 12.5 11.7* 11.3*  HCT 40.9 37.8 36.4 35.1*  MCV 86.3 86.5 87.7 86.9  PLT 359 340 285 297   Cardiac Enzymes:  Recent Labs Lab 03/17/13 0130 03/17/13 0515 03/17/13 1019 03/17/13 1541  TROPONINI <0.30 <0.30 <0.30 <0.30   BNP: BNP (last 3 results) No results found for this basename: PROBNP,  in the last 8760 hours CBG: No results found for this basename: GLUCAP,  in the last 168 hours     Signed:  MEMON,JEHANZEB  [facility] 03/21/2013, 7:56 AM

## 2013-03-22 ENCOUNTER — Encounter (HOSPITAL_COMMUNITY): Payer: Self-pay | Admitting: General Surgery

## 2013-03-22 LAB — CULTURE, BLOOD (ROUTINE X 2)
Culture: NO GROWTH
Culture: NO GROWTH

## 2013-07-05 ENCOUNTER — Other Ambulatory Visit (HOSPITAL_COMMUNITY): Payer: Self-pay | Admitting: Family Medicine

## 2013-07-05 DIAGNOSIS — Z139 Encounter for screening, unspecified: Secondary | ICD-10-CM

## 2013-07-11 ENCOUNTER — Ambulatory Visit (HOSPITAL_COMMUNITY)
Admission: RE | Admit: 2013-07-11 | Discharge: 2013-07-11 | Disposition: A | Discharge status: Home / Self Care | Payer: 59 | Source: Ambulatory Visit | Attending: Family Medicine | Admitting: Family Medicine

## 2013-07-11 DIAGNOSIS — Z1231 Encounter for screening mammogram for malignant neoplasm of breast: Secondary | ICD-10-CM | POA: Insufficient documentation

## 2013-07-11 DIAGNOSIS — Z139 Encounter for screening, unspecified: Secondary | ICD-10-CM

## 2013-07-20 ENCOUNTER — Encounter: Payer: Self-pay | Admitting: Obstetrics and Gynecology

## 2013-07-20 ENCOUNTER — Ambulatory Visit (INDEPENDENT_AMBULATORY_CARE_PROVIDER_SITE_OTHER): Payer: 59 | Admitting: Obstetrics and Gynecology

## 2013-07-20 ENCOUNTER — Other Ambulatory Visit (HOSPITAL_COMMUNITY)
Admission: RE | Admit: 2013-07-20 | Discharge: 2013-07-20 | Disposition: A | Discharge status: Home / Self Care | Payer: 59 | Source: Ambulatory Visit | Attending: Obstetrics and Gynecology | Admitting: Obstetrics and Gynecology

## 2013-07-20 ENCOUNTER — Encounter (INDEPENDENT_AMBULATORY_CARE_PROVIDER_SITE_OTHER): Payer: Self-pay

## 2013-07-20 VITALS — BP 112/84 | Ht 70.0 in | Wt 334.4 lb

## 2013-07-20 DIAGNOSIS — R350 Frequency of micturition: Secondary | ICD-10-CM

## 2013-07-20 DIAGNOSIS — N3941 Urge incontinence: Secondary | ICD-10-CM

## 2013-07-20 DIAGNOSIS — Z1389 Encounter for screening for other disorder: Secondary | ICD-10-CM

## 2013-07-20 DIAGNOSIS — Z1151 Encounter for screening for human papillomavirus (HPV): Secondary | ICD-10-CM | POA: Insufficient documentation

## 2013-07-20 DIAGNOSIS — Z113 Encounter for screening for infections with a predominantly sexual mode of transmission: Secondary | ICD-10-CM | POA: Insufficient documentation

## 2013-07-20 DIAGNOSIS — Z1212 Encounter for screening for malignant neoplasm of rectum: Secondary | ICD-10-CM

## 2013-07-20 DIAGNOSIS — Z01419 Encounter for gynecological examination (general) (routine) without abnormal findings: Secondary | ICD-10-CM

## 2013-07-20 LAB — HEMOCCULT GUIAC POC 1CARD (OFFICE): FECAL OCCULT BLD: NEGATIVE

## 2013-07-20 LAB — POCT URINALYSIS DIPSTICK
Glucose, UA: NEGATIVE
Ketones, UA: NEGATIVE
Leukocytes, UA: NEGATIVE
Nitrite, UA: NEGATIVE
Protein, UA: NEGATIVE

## 2013-07-20 MED ORDER — MIRABEGRON ER 25 MG PO TB24: 25.0000 mg | ORAL_TABLET | Freq: Every day | ORAL | Status: DC

## 2013-07-20 NOTE — Progress Notes (Signed)
This chart was transcribed for Dr. Christin BachJohn Teretha Mitchell by Carrie Mitchell, ED scribe.   Assessment:  Annual Gyn Exam Urge incontinence, nocturia Morbid obesity oligomenorrhea Will prescribe Myrbetriq    Plan:  1. pap smear done, next pap due in 3 years.  2. return annually or prn 3. [facility] 1 month to discuss UI[facility]    Annual mammogram advised Subjective:  Carrie Mitchell is a 43 y.o. female No obstetric history on file. who presents for annual exam. Patient's last menstrual period was 06/29/2013. The patient has complaints today of urinary frequency and urgency. She reports waking 4-6 times at night to urinate. She reports her last pap smear was 14 years ago. Her last menstrual period was January 2015, 3 years after the last one. she reports it was heavy and lasted for 5 days. Last mammogram was January 2015.   The following portions of the patient's history were reviewed and updated as appropriate: allergies, current medications, past family history, past medical history, past social history, past surgical history and problem list.  Review of Systems Constitutional: negative Gastrointestinal: negative Genitourinary: urinary frequency  Objective:  BP 112/84  Ht 5\' 10"  (1.778 m)  Wt 334 lb 6.4 oz (151.683 kg)  BMI 47.98 kg/m2  LMP 06/29/2013   BMI: Body mass index is 47.98 kg/(m^2).   Physical Exam limited by body habitus    General Appearance: Alert, appropriate appearance for age. No acute distress HEENT: Grossly normal Neck / Thyroid:  Cardiovascular: RRR; normal S1, S2, no murmur Lungs: CTA bilaterally Back: No CVAT Breast Exam: No dimpling, nipple retraction or discharge. No masses or nodes., Normal to inspection, Normal breast tissue bilaterally and No masses or nodes.No dimpling, nipple retraction or discharge. Gastrointestinal: Soft, non-tender, no masses or organomegaly Pelvic Exam: External genitalia: normal general appearance Urinary system: urethral  meatus normal Vaginal: normal mucosa without prolapse or lesions and normal without tenderness, induration or masses Cervix: normal appearance Adnexa: normal bimanual exam Uterus: normal single, nontender and anteverted Rectal: good sphincter tone, no masses and guaiac negative Rectovaginal: normal rectal, no masses Lymphatic Exam: Non-palpable nodes in neck, clavicular, axillary, or inguinal regions  Skin: no rash or abnormalities Neurologic: Normal gait and speech, no tremor  Psychiatric: Alert and oriented, appropriate affect.  Urinalysis:trace blood.  Carrie Mitchell. MD Pgr [phone] 9:14 AM

## 2013-07-20 NOTE — Patient Instructions (Signed)
Urinary Incontinence Urinary incontinence is the involuntary loss of urine from your bladder. CAUSES  There are many causes of urinary incontinence. They include:  Medicines.  Infections.  Prostatic enlargement, leading to overflow of urine from your bladder.  Surgery.  Neurological diseases.  Emotional factors. SIGNS AND SYMPTOMS Urinary Incontinence can be divided into four types: 1. Urge incontinence. Urge incontinence is the involuntary loss of urine before you have the opportunity to go to the bathroom. There is a sudden urge to void but not enough time to reach a bathroom. 2. Stress incontinence. Stress incontinence is the sudden loss of urine with any activity that forces urine to pass. It is commonly caused by anatomical changes to the pelvis and sphincter areas of your body. 3. Overflow incontinence. Overflow incontinence is the loss of urine from an obstructed opening to your bladder. This results in a backup of urine and a resultant buildup of pressure within the bladder. When the pressure within the bladder exceeds the closing pressure of the sphincter, the urine overflows, which causes incontinence, similar to water overflowing a dam. 4. Total incontinence. Total incontinence is the loss of urine as a result of the inability to store urine within your bladder. DIAGNOSIS  Evaluating the cause of incontinence may require:  A thorough and complete medical and obstetric history.  A complete physical exam.  Laboratory tests such as a urine culture and sensitivities. When additional tests are indicated, they can include:  An ultrasound exam.  Kidney and bladder X-rays.  Cystoscopy. This is an exam of the bladder using a narrow scope.  Urodynamic testing to test the nerve function to the bladder and sphincter areas. TREATMENT  Treatment for urinary incontinence depends on the cause:  For urge incontinence caused by a bacterial infection, antibiotics will be prescribed.  If the urge incontinence is related to medicines you take, your health care provider may have you change the medicine.  For stress incontinence, surgery to re-establish anatomical support to the bladder or sphincter, or both, will often correct the condition.  For overflow incontinence caused by an enlarged prostate, an operation to open the channel through the enlarged prostate will allow the flow of urine out of the bladder. In women with fibroids, a hysterectomy may be recommended.  For total incontinence, surgery on your urinary sphincter may help. An artificial urinary sphincter (an inflatable cuff placed around the urethra) may be required. In women who have developed a hole-like passage between their bladder and vagina (vesicovaginal fistula), surgery to close the fistula often is required. HOME CARE INSTRUCTIONS  Normal daily hygiene and the use of pads or adult diapers that are changed regularly will help prevent odors and skin damage.  Avoid caffeine. It can overstimulate your bladder.  Use the bathroom regularly. Try about every 2 3 hours to go to the bathroom, even if you do not feel the need to do so. Take time to empty your bladder completely. After urinating, wait a minute. Then try to urinate again.  For causes involving nerve dysfunction, keep a log of the medicines you take and a journal of the times you go to the bathroom. SEEK MEDICAL CARE IF:  You experience worsening of pain instead of improvement in pain after your procedure.  Your incontinence becomes worse instead of better. SEE IMMEDIATE MEDICAL CARE IF:  You experience fever or shaking chills.  You are unable to pass your urine.  You have redness spreading into your groin or down into your thighs. MAKE   SURE YOU:   Understand these instructions.   Will watch your condition.  Will get help right away if you are not doing well or get worse. Document Released: 07/16/2004 Document Revised: 03/29/2013 Document  Reviewed: 11/15/2012 [employer] Patient Information 2014 [employer].  

## 2013-08-16 ENCOUNTER — Telehealth: Payer: Self-pay | Admitting: Obstetrics and Gynecology

## 2013-08-16 NOTE — Telephone Encounter (Signed)
Pt states thinks she has a UTI or bladder infection. Pt to keep her appt for Friday at 8:20 am. Pt verbalized understanding.

## 2013-08-18 ENCOUNTER — Ambulatory Visit: Payer: 59 | Admitting: Obstetrics and Gynecology

## 2014-01-30 ENCOUNTER — Inpatient Hospital Stay (HOSPITAL_COMMUNITY): Payer: BC Managed Care – PPO

## 2014-01-30 ENCOUNTER — Encounter (HOSPITAL_COMMUNITY): Payer: Self-pay | Admitting: Emergency Medicine

## 2014-01-30 ENCOUNTER — Inpatient Hospital Stay (HOSPITAL_COMMUNITY)
Admission: EM | Admit: 2014-01-30 | Discharge: 2014-02-01 | DRG: 603 | Disposition: A | Discharge status: Home / Self Care | Payer: BC Managed Care – PPO | Attending: Internal Medicine | Admitting: Internal Medicine

## 2014-01-30 DIAGNOSIS — L0390 Cellulitis, unspecified: Secondary | ICD-10-CM | POA: Diagnosis present

## 2014-01-30 DIAGNOSIS — Z823 Family history of stroke: Secondary | ICD-10-CM

## 2014-01-30 DIAGNOSIS — L03119 Cellulitis of unspecified part of limb: Secondary | ICD-10-CM | POA: Diagnosis present

## 2014-01-30 DIAGNOSIS — Z8249 Family history of ischemic heart disease and other diseases of the circulatory system: Secondary | ICD-10-CM | POA: Diagnosis not present

## 2014-01-30 DIAGNOSIS — Z6841 Body Mass Index (BMI) 40.0 and over, adult: Secondary | ICD-10-CM | POA: Diagnosis not present

## 2014-01-30 DIAGNOSIS — E669 Obesity, unspecified: Secondary | ICD-10-CM | POA: Diagnosis present

## 2014-01-30 DIAGNOSIS — L02419 Cutaneous abscess of limb, unspecified: Principal | ICD-10-CM

## 2014-01-30 DIAGNOSIS — Z87891 Personal history of nicotine dependence: Secondary | ICD-10-CM | POA: Diagnosis not present

## 2014-01-30 DIAGNOSIS — M1711 Unilateral primary osteoarthritis, right knee: Secondary | ICD-10-CM | POA: Diagnosis present

## 2014-01-30 DIAGNOSIS — Z833 Family history of diabetes mellitus: Secondary | ICD-10-CM | POA: Diagnosis not present

## 2014-01-30 DIAGNOSIS — M1710 Unilateral primary osteoarthritis, unspecified knee: Secondary | ICD-10-CM | POA: Diagnosis present

## 2014-01-30 DIAGNOSIS — M79609 Pain in unspecified limb: Secondary | ICD-10-CM | POA: Diagnosis present

## 2014-01-30 DIAGNOSIS — E876 Hypokalemia: Secondary | ICD-10-CM | POA: Diagnosis present

## 2014-01-30 DIAGNOSIS — L03115 Cellulitis of right lower limb: Secondary | ICD-10-CM | POA: Diagnosis present

## 2014-01-30 DIAGNOSIS — IMO0002 Reserved for concepts with insufficient information to code with codable children: Secondary | ICD-10-CM | POA: Diagnosis present

## 2014-01-30 LAB — URINALYSIS, ROUTINE W REFLEX MICROSCOPIC
BILIRUBIN URINE: NEGATIVE
GLUCOSE, UA: NEGATIVE mg/dL
KETONES UR: NEGATIVE mg/dL
Nitrite: NEGATIVE
PROTEIN: 30 mg/dL — AB
Specific Gravity, Urine: 1.025 (ref 1.005–1.030)
Urobilinogen, UA: 0.2 mg/dL (ref 0.0–1.0)
pH: 6 (ref 5.0–8.0)

## 2014-01-30 LAB — CBC WITH DIFFERENTIAL/PLATELET
Basophils Absolute: 0.1 10*3/uL (ref 0.0–0.1)
Basophils Relative: 1 % (ref 0–1)
EOS ABS: 1.1 10*3/uL — AB (ref 0.0–0.7)
EOS PCT: 12 % — AB (ref 0–5)
HCT: 39 % (ref 36.0–46.0)
HEMOGLOBIN: 12.8 g/dL (ref 12.0–15.0)
LYMPHS ABS: 2 10*3/uL (ref 0.7–4.0)
Lymphocytes Relative: 23 % (ref 12–46)
MCH: 28.4 pg (ref 26.0–34.0)
MCHC: 32.8 g/dL (ref 30.0–36.0)
MCV: 86.5 fL (ref 78.0–100.0)
Monocytes Absolute: 0.5 10*3/uL (ref 0.1–1.0)
Monocytes Relative: 6 % (ref 3–12)
Neutro Abs: 5.2 10*3/uL (ref 1.7–7.7)
Neutrophils Relative %: 58 % (ref 43–77)
PLATELETS: 295 10*3/uL (ref 150–400)
RBC: 4.51 MIL/uL (ref 3.87–5.11)
RDW: 14.9 % (ref 11.5–15.5)
WBC: 8.9 10*3/uL (ref 4.0–10.5)

## 2014-01-30 LAB — BASIC METABOLIC PANEL
Anion gap: 15 (ref 5–15)
BUN: 12 mg/dL (ref 6–23)
CO2: 18 mEq/L — ABNORMAL LOW (ref 19–32)
CREATININE: 0.96 mg/dL (ref 0.50–1.10)
Calcium: 8.8 mg/dL (ref 8.4–10.5)
Chloride: 104 mEq/L (ref 96–112)
GFR calc Af Amer: 83 mL/min — ABNORMAL LOW (ref >90)
GFR, EST NON AFRICAN AMERICAN: 71 mL/min — AB (ref >90)
GLUCOSE: 109 mg/dL — AB (ref 70–99)
Potassium: 4.1 mEq/L (ref 3.7–5.3)
Sodium: 137 mEq/L (ref 137–147)

## 2014-01-30 LAB — URINE MICROSCOPIC-ADD ON

## 2014-01-30 LAB — PREGNANCY, URINE: Preg Test, Ur: NEGATIVE

## 2014-01-30 MED ORDER — ACETAMINOPHEN 650 MG RE SUPP: 650.0000 mg | Freq: Four times a day (QID) | RECTAL | Status: DC | PRN

## 2014-01-30 MED ORDER — SODIUM CHLORIDE 0.9 % IV SOLN
2000.0000 mg | Freq: Two times a day (BID) | INTRAVENOUS | Status: DC
  Administered 2014-01-30 – 2014-02-01 (×4): 2000 mg via INTRAVENOUS
  Filled 2014-01-30 (×6): qty 2000

## 2014-01-30 MED ORDER — ONDANSETRON HCL 4 MG/2ML IJ SOLN
4.0000 mg | Freq: Once | INTRAMUSCULAR | Status: AC
  Administered 2014-01-30: 4 mg via INTRAVENOUS
  Filled 2014-01-30: qty 2

## 2014-01-30 MED ORDER — HYDROCODONE-ACETAMINOPHEN 5-325 MG PO TABS
1.0000 | ORAL_TABLET | ORAL | Status: DC | PRN
  Administered 2014-01-31: 1 via ORAL
  Administered 2014-01-31: 2 via ORAL
  Filled 2014-01-30: qty 1
  Filled 2014-01-30: qty 2

## 2014-01-30 MED ORDER — ACETAMINOPHEN 325 MG PO TABS: 650.0000 mg | ORAL_TABLET | Freq: Four times a day (QID) | ORAL | Status: DC | PRN

## 2014-01-30 MED ORDER — POLYETHYLENE GLYCOL 3350 17 G PO PACK: 17.0000 g | PACK | Freq: Every day | ORAL | Status: DC | PRN

## 2014-01-30 MED ORDER — SENNA 8.6 MG PO TABS
1.0000 | ORAL_TABLET | Freq: Two times a day (BID) | ORAL | Status: DC
  Administered 2014-01-30 – 2014-02-01 (×5): 8.6 mg via ORAL
  Filled 2014-01-30 (×5): qty 1

## 2014-01-30 MED ORDER — ALUM & MAG HYDROXIDE-SIMETH 200-200-20 MG/5ML PO SUSP: 30.0000 mL | Freq: Four times a day (QID) | ORAL | Status: DC | PRN

## 2014-01-30 MED ORDER — ONDANSETRON HCL 4 MG PO TABS: 4.0000 mg | ORAL_TABLET | Freq: Four times a day (QID) | ORAL | Status: DC | PRN

## 2014-01-30 MED ORDER — ONDANSETRON HCL 4 MG/2ML IJ SOLN: 4.0000 mg | Freq: Four times a day (QID) | INTRAMUSCULAR | Status: DC | PRN

## 2014-01-30 MED ORDER — ENOXAPARIN SODIUM 80 MG/0.8ML ~~LOC~~ SOLN
80.0000 mg | SUBCUTANEOUS | Status: DC
  Administered 2014-01-30 – 2014-01-31 (×2): 80 mg via SUBCUTANEOUS
  Filled 2014-01-30 (×2): qty 0.8

## 2014-01-30 MED ORDER — VANCOMYCIN HCL IN DEXTROSE 1-5 GM/200ML-% IV SOLN
1000.0000 mg | Freq: Once | INTRAVENOUS | Status: AC
  Administered 2014-01-30: 1000 mg via INTRAVENOUS
  Filled 2014-01-30: qty 200

## 2014-01-30 MED ORDER — SODIUM CHLORIDE 0.9 % IV SOLN
INTRAVENOUS | Status: AC
  Administered 2014-01-30: 14:00:00 via INTRAVENOUS

## 2014-01-30 MED ORDER — MORPHINE SULFATE 2 MG/ML IJ SOLN
1.0000 mg | INTRAMUSCULAR | Status: DC | PRN
  Administered 2014-01-30 – 2014-01-31 (×4): 1 mg via INTRAVENOUS
  Filled 2014-01-30 (×4): qty 1

## 2014-01-30 MED ORDER — MORPHINE SULFATE 4 MG/ML IJ SOLN
4.0000 mg | Freq: Once | INTRAMUSCULAR | Status: AC
  Administered 2014-01-30: 4 mg via INTRAVENOUS
  Filled 2014-01-30: qty 1

## 2014-01-30 MED ORDER — SODIUM CHLORIDE 0.9 % IV BOLUS (SEPSIS)
1000.0000 mL | Freq: Once | INTRAVENOUS | Status: AC
  Administered 2014-01-30: 1000 mL via INTRAVENOUS

## 2014-01-30 MED ORDER — BISACODYL 10 MG RE SUPP: 10.0000 mg | Freq: Every day | RECTAL | Status: DC | PRN

## 2014-01-30 NOTE — ED Notes (Signed)
[facility]

## 2014-01-30 NOTE — H&P (Signed)
Patient seen and examined by me.  Please see original note by Toya SmothersKaren Black.  C/o chills.  Check blood cultures- has had several episodes in the past.  Marlin CanaryJessica Sharnette Kitamura DO

## 2014-01-30 NOTE — Progress Notes (Signed)
ANTIBIOTIC CONSULT NOTE - INITIAL  Pharmacy Consult for Vancomycin Indication: cellulitis  No Known Allergies  Patient Measurements: Height: 5\' 9"  (175.3 cm) Weight: 345 lb (156.491 kg) IBW/kg (Calculated) : 66.2  Vital Signs: Temp: 98.4 F (36.9 C) (08/11 1300) Temp src: Oral (08/11 1300) BP: 112/59 mmHg (08/11 1300) Pulse Rate: 77 (08/11 1300) Intake/Output from previous day:   Intake/Output from this shift:    Labs:  Recent Labs  01/30/14 1059 01/30/14 1143  WBC 8.9  --   HGB 12.8  --   PLT 295  --   CREATININE  --  0.96   Estimated Creatinine Clearance: 122 ml/min (by C-G formula based on Cr of 0.96). No results found for this basename: VANCOTROUGH, VANCOPEAK, VANCORANDOM, GENTTROUGH, GENTPEAK, GENTRANDOM, TOBRATROUGH, TOBRAPEAK, TOBRARND, AMIKACINPEAK, AMIKACINTROU, AMIKACIN,  in the last 72 hours   Microbiology: No results found for this or any previous visit (from the past 720 hour(s)).  Medical History: Past Medical History  Diagnosis Date  . Cellulitis   . Dermatitis 02/16/2012  . Cholelithiasis 03/18/2013   Vancomycin 8/11 >>  Assessment: 43yo morbidly obese female with good renal fxn.  Normalized clcr ~ 80-2585ml/min.  Pt admitted with LE cellulitis and asked to start Vancomycin.    Goal of Therapy:  Vancomycin trough level 10-15 mcg/ml  Plan:  Vancomycin 2000mg  IV q12hrs Check trough at steady state Monitor labs, renal fxn, and cultures  Valrie HartHall, Rennie Rouch A 01/30/2014,1:39 PM

## 2014-01-30 NOTE — ED Notes (Signed)
Right leg pain and swelling x 2 days.  Denies injury.

## 2014-01-30 NOTE — ED Provider Notes (Signed)
CSN: [id number]     Arrival date & time 01/30/14  [id number] History   First MD Initiated Contact with Patient 01/30/14 1005     Chief Complaint  Patient presents with  . Leg Pain     (Consider location/radiation/quality/duration/timing/severity/associated sxs/prior Treatment) Patient is a 43 y.o. female presenting with leg pain.  Leg Pain Associated symptoms: fever   Associated symptoms: no neck pain     Carrie Mitchell is a 43 y.o. female with a past medical history significant for intermittent episodes of lower extremity cellulitis, generally requiring admission presenting with a two-day history of right lower extremity pain swelling and redness.  Carrie Mitchell has had subjective fever for the past 3 days and has felt generally unwell with myalgias and generalized weakness.  Carrie Mitchell states her right lower extremity is always larger than her left but it is subtly increased along with increased pain and redness for the past 2 days.  Carrie Mitchell has a nonspecific dermatitis on her feet which flares and has proceeded cellulitis episodes in the past.  Carrie Mitchell has been admitted for similar symptoms on 3 other occasions in the past 2 years.  Carrie Mitchell has taken ibuprofen prior to arrival with no improvement in her pain.  Carrie Mitchell denies chest pain and shortness of breath.  Carrie Mitchell does not have a history of DVTs.  Carrie Mitchell also describes chronic increased urinary frequency without painful urination which has been present since February and has not responded to antibiotics, Azo or another or medication which Carrie Mitchell cannot recall which was prescribed for overactive bladder but actually made her symptoms worse.  Past Medical History  Diagnosis Date  . Cellulitis   . Dermatitis 02/16/2012  . Cholelithiasis 03/18/2013   Past Surgical History  Procedure Laterality Date  . Tubal ligation    . Breast surgery    . Cholecystectomy N/A 03/20/2013    Procedure: LAPAROSCOPIC CHOLECYSTECTOMY;  Surgeon: Fabio BeringBrent C Ziegler, MD;  Location: [facility];  Service: General;   Laterality: N/A;   Family History  Problem Relation Age of Onset  . Diabetes Mother   . Hypertension Mother   . Stroke Mother   . Heart attack Father   . Crohn's disease Sister   . Hypertension Brother   . Hypertension Brother    History  Substance Use Topics  . Smoking status: Former Smoker    Types: Cigarettes  . Smokeless tobacco: Never Used  . Alcohol Use: No   OB History   Grav Para Term Preterm Abortions TAB SAB Ect Mult Living                 Review of Systems  Constitutional: Positive for fever and chills.  HENT: Negative for congestion and sore throat.   Eyes: Negative.   Respiratory: Negative for chest tightness and shortness of breath.   Cardiovascular: Negative for chest pain.  Gastrointestinal: Negative for vomiting and abdominal pain.  Genitourinary: Negative.   Musculoskeletal: Positive for myalgias. Negative for arthralgias, joint swelling and neck pain.  Skin: Positive for color change and rash. Negative for wound.  Neurological: Negative for dizziness, weakness, light-headedness, numbness and headaches.  Psychiatric/Behavioral: Negative.       Allergies  Review of patient's allergies indicates no known allergies.  Home Medications   Prior to Admission medications   Medication Sig Start Date End Date Taking? Authorizing Provider  acetaminophen-codeine (TYLENOL #3) 300-30 MG per tablet Take 1 tablet by mouth every 8 (eight) hours as needed for moderate pain.   Yes Historical Provider,  MD  ibuprofen (ADVIL,MOTRIN) 800 MG tablet Take 800 mg by mouth every 8 (eight) hours as needed for moderate pain.   Yes Historical Provider, MD   BP 104/78  Pulse 84  Temp(Src) 98 F (36.7 C) (Oral)  Resp 20  Ht 5\' 9"  (1.753 m)  Wt 345 lb (156.491 kg)  BMI 50.92 kg/m2  SpO2 98%  LMP 06/29/2013 Physical Exam  Constitutional: Carrie Mitchell appears well-developed and well-nourished.  HENT:  Head: Atraumatic.  Neck: Normal range of motion.  Cardiovascular:  Pulses  equal bilaterally  Musculoskeletal: Carrie Mitchell exhibits edema and tenderness.  Neurological: Carrie Mitchell is alert. Carrie Mitchell has normal strength. Carrie Mitchell displays normal reflexes. No sensory deficit.  Skin: Skin is warm and dry. Rash noted. There is erythema.  Psychiatric: Carrie Mitchell has a normal mood and affect.    ED Course  Procedures (including critical care time) Labs Review Labs Reviewed  CBC WITH DIFFERENTIAL - Abnormal; Notable for the following:    Eosinophils Relative 12 (*)    Eosinophils Absolute 1.1 (*)    All other components within normal limits  BASIC METABOLIC PANEL - Abnormal; Notable for the following:    CO2 18 (*)    Glucose, Bld 109 (*)    GFR calc non Af Amer 71 (*)    GFR calc Af Amer 83 (*)    All other components within normal limits  URINALYSIS, ROUTINE W REFLEX MICROSCOPIC - Abnormal; Notable for the following:    Hgb urine dipstick SMALL (*)    Protein, ur 30 (*)    Leukocytes, UA TRACE (*)    All other components within normal limits  URINE MICROSCOPIC-ADD ON - Abnormal; Notable for the following:    Squamous Epithelial / LPF FEW (*)    Bacteria, UA MANY (*)    All other components within normal limits  URINE CULTURE  PREGNANCY, URINE    Imaging Review No results found.   EKG Interpretation None      MDM   Final diagnoses:  Cellulitis and abscess of lower extremity    Pt was seen by Dr. Estell Harpin during his ED visit.  He agrees with IV antibiotics and admission.  Carrie Mitchell was given 1 g of vancomycin.  Labs reviewed.  Urine culture pending.  Spoke with Dr. Zenaida Niece with [facility] and temporary admission orders were placed.    Burgess Amor, [name] 01/30/14 1234

## 2014-01-30 NOTE — H&P (Signed)
[facility] History and Physical  Carrie M Kutsch ZOX:[id number] DOB: 07/22/1970 DOA: 01/30/2014  Referring physician:  PCP: [name],[name], [name]   Chief Complaint: right lower extremity pain/swelling  HPI: Carrie Mitchell is a 43 y.o. female with a past medical history that includes cellulitis, dermatitis presents to the emergency department with chief complaint of right lower extremity swelling pain and erythema. She denies any recent falls or trauma to the lower extremity.  Associated symptoms include subjective fever chills nausea without vomiting. She reports developing redness swelling and pain in her right lower extremity gradually over the last 2 days. She indicates that she had fever and chills initially and then the symptoms in her legs started. She was taken ibuprofen with little relief. She denies any chest pain palpitations shortness of breath. She denies any dysuria hematuria frequency or urgency. She denies abdominal pain diarrhea constipation melena or hematochezia. Her last BM was 2 days ago. She reports eating and drinking her normal amount.   Initial workup in the emergency department includes basic metabolic panel which is unremarkable, complete blood count which is unremarkable. Urinalysis with many bacteria and few squama cells trace leukocytes 3-6 WBCs. Urine culture. Her vital signs are stable she is afebrile and not hypoxic. She is given morphine, Zofran, vancomycin in the emergency department   Review of Systems:  10 point review of systems completed and all systems are negative except as indicated in the history of present illness   Past Medical History  Diagnosis Date  . Cellulitis   . Dermatitis 02/16/2012  . Cholelithiasis 03/18/2013   Past Surgical History  Procedure Laterality Date  . Tubal ligation    . Breast surgery    . Cholecystectomy N/A 03/20/2013    Procedure: LAPAROSCOPIC CHOLECYSTECTOMY;  Surgeon: Fabio BeringBrent C Ziegler, MD;  Location: [facility];   Service: General;  Laterality: N/A;   Social History:  reports that she has quit smoking. Her smoking use included Cigarettes. She smoked 0.00 packs per day. She has never used smokeless tobacco. She reports that she does not drink alcohol or use illicit drugs. She is a mother 4 children. 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 She is independent with ADL No Known Allergies  Family History  Problem Relation Age of Onset  . Diabetes Mother   . Hypertension Mother   . Stroke Mother   . Heart attack Father   . Crohn's disease Sister   . Hypertension Brother   . Hypertension Brother      Prior to Admission medications   Medication Sig Start Date End Date Taking? Authorizing Provider  acetaminophen-codeine (TYLENOL #3) 300-30 MG per tablet Take 1 tablet by mouth every 8 (eight) hours as needed for moderate pain.   Yes Historical Provider, MD  ibuprofen (ADVIL,MOTRIN) 800 MG tablet Take 800 mg by mouth every 8 (eight) hours as needed for moderate pain.   Yes Historical Provider, MD   Physical Exam: Filed Vitals:   01/30/14 0938 01/30/14 1220  BP: 104/78 118/75  Pulse: 84 89  Temp: 98 F (36.7 C)   TempSrc: Oral   Resp: 20   Height: 5\' 9"  (1.753 m)   Weight: 156.491 kg (345 lb)   SpO2: 98%     Wt Readings from Last 3 Encounters:  01/30/14 156.491 kg (345 lb)  07/20/13 151.683 kg (334 lb 6.4 oz)  03/17/13 142.883 kg (315 lb)    General:  Appears calm and comfortable Eyes: PERRL, normal lids, irises &  conjunctiva ENT: grossly normal hearing, lips & tongue Neck: no LAD, masses or thyromegaly Cardiovascular: RRR, no m/r/g. Right lower extremity with nonpitting edema. Pedal pulses are present and palpable bilaterally Respiratory: CTA bilaterally, no w/r/r. Normal respiratory effort. Abdomen: soft, ntnd positive bowel sounds throughout no mass organomegaly noted Skin: Right lower extremity with slight edema with erythema from ankle to mid shin. Very tender to touch  warm to touch. No induration or fluctuance Musculoskeletal: grossly normal tone BUE/BLE Psychiatric: grossly normal mood and affect, speech fluent and appropriate Neurologic: grossly non-focal.          Labs on Admission:  Basic Metabolic Panel:  Recent Labs Lab 01/30/14 1143  NA 137  K 4.1  CL 104  CO2 18*  GLUCOSE 109*  BUN 12  CREATININE 0.96  CALCIUM 8.8   Liver Function Tests: No results found for this basename: AST, ALT, ALKPHOS, BILITOT, PROT, ALBUMIN,  in the last 168 hours No results found for this basename: LIPASE, AMYLASE,  in the last 168 hours No results found for this basename: AMMONIA,  in the last 168 hours CBC:  Recent Labs Lab 01/30/14 1059  WBC 8.9  NEUTROABS 5.2  HGB 12.8  HCT 39.0  MCV 86.5  PLT 295   Cardiac Enzymes: No results found for this basename: CKTOTAL, CKMB, CKMBINDEX, TROPONINI,  in the last 168 hours  BNP (last 3 results) No results found for this basename: PROBNP,  in the last 8760 hours CBG: No results found for this basename: GLUCAP,  in the last 168 hours  Radiological Exams on Admission: No results found.  EKG:   Assessment/Plan Principal Problem:   Cellulitis of leg, right; recurrent. Chart review indicates admitted September of 2014 the same. Will admit to medical floor. Will continue IV vancomycin that was initiated in the emergency department. Will request lower extremity Doppler to rule out DVT however I doubt. Will provide pain management and antimanic as needed.  Active Problems: Obesity: BMI 51.1. Request nutritional consult    DEGENERATIVE JOINT DISEASE, RIGHT KNEE: At baseline.     Code Status: full DVT Prophylaxis: Family Communication: daughter at bedside Disposition Plan: home when ready  Time spent: 60 minutes  [facility] Pager [phone]  **Disclaimer: This note may have been dictated with voice recognition software. Similar sounding words can inadvertently be transcribed  and this note may contain transcription errors which may not have been corrected upon publication of note.**

## 2014-01-31 LAB — CBC
HEMATOCRIT: 37.1 % (ref 36.0–46.0)
HEMOGLOBIN: 12 g/dL (ref 12.0–15.0)
MCH: 28.1 pg (ref 26.0–34.0)
MCHC: 32.3 g/dL (ref 30.0–36.0)
MCV: 86.9 fL (ref 78.0–100.0)
Platelets: 308 10*3/uL (ref 150–400)
RBC: 4.27 MIL/uL (ref 3.87–5.11)
RDW: 14.9 % (ref 11.5–15.5)
WBC: 6.6 10*3/uL (ref 4.0–10.5)

## 2014-01-31 LAB — BASIC METABOLIC PANEL
ANION GAP: 11 (ref 5–15)
BUN: 8 mg/dL (ref 6–23)
CHLORIDE: 107 meq/L (ref 96–112)
CO2: 24 meq/L (ref 19–32)
CREATININE: 0.95 mg/dL (ref 0.50–1.10)
Calcium: 8.5 mg/dL (ref 8.4–10.5)
GFR calc Af Amer: 84 mL/min — ABNORMAL LOW (ref >90)
GFR calc non Af Amer: 72 mL/min — ABNORMAL LOW (ref >90)
GLUCOSE: 106 mg/dL — AB (ref 70–99)
Potassium: 3.5 mEq/L — ABNORMAL LOW (ref 3.7–5.3)
Sodium: 142 mEq/L (ref 137–147)

## 2014-01-31 LAB — URINE CULTURE

## 2014-01-31 MED ORDER — DIPHENHYDRAMINE HCL 25 MG PO CAPS
25.0000 mg | ORAL_CAPSULE | Freq: Three times a day (TID) | ORAL | Status: DC | PRN
  Administered 2014-01-31 (×2): 25 mg via ORAL
  Filled 2014-01-31 (×3): qty 1

## 2014-01-31 MED ORDER — POTASSIUM CHLORIDE CRYS ER 20 MEQ PO TBCR
40.0000 meq | EXTENDED_RELEASE_TABLET | Freq: Once | ORAL | Status: AC
  Administered 2014-01-31: 40 meq via ORAL
  Filled 2014-01-31: qty 2

## 2014-01-31 NOTE — Care Management Note (Addendum)
    Page 1 of 1   02/01/2014     11:30:47 AM CARE MANAGEMENT NOTE 02/01/2014  Patient:  Carrie Mitchell,Carrie Mitchell   Account Number:  [id number]  Date Initiated:  01/31/2014  Documentation initiated by:  Sharrie RothmanBLACKWELL,Nolyn Eilert C  Subjective/Objective Assessment:   Pt admitted from home with cellulitis. Pt lives with her family and will return home at discharge. Pt is indepdent with ADL's.[facility].     Action/Plan:   No CM needs noted.   Anticipated DC Date:  02/03/2014   Anticipated DC Plan:  HOME/SELF CARE      DC Planning Services  CM consult      Choice offered to / List presented to:             Status of service:  Completed, signed off Medicare Important Message given?   (If response is "NO", the following Medicare IM given date fields will be blank) Date Medicare IM given:   Medicare IM given by:   Date Additional Medicare IM given:   Additional Medicare IM given by:    Discharge Disposition:  HOME/SELF CARE  Per UR Regulation:    If discussed at Long Length of Stay Meetings, dates discussed:    Comments:  02/01/14 1130 Arlyss Queenammy Krystiana Fornes, RN BSN CM Pt discharged home today. No CM needs noted.  01/31/14 1045 Arlyss Queenammy Erma Joubert, Charity fundraiserN BSN CM

## 2014-01-31 NOTE — Progress Notes (Signed)
UR chart review completed.  

## 2014-01-31 NOTE — ED Provider Notes (Signed)
Medical screening examination/treatment/procedure(s) were performed by non-physician practitioner and as supervising physician I was immediately available for consultation/collaboration.   EKG Interpretation None        Khalik Pewitt L Dee Paden, MD 01/31/14 0708 

## 2014-01-31 NOTE — Progress Notes (Addendum)
Pt called nurse c/o itching and pain in RLE. Will administer PRN Benadryl and Vicodin. VSS. Will continue to monitor pt throughout night. Daughter at bedside. Pt resting in bed @ lowest position & call bell within reach.

## 2014-01-31 NOTE — Progress Notes (Signed)
[facility] PROGRESS NOTE  Carrie Mitchell [id number] DOB: 04/14/1971 DOA: 01/30/2014 PCP: Carrie ProudJACKSON,[name], [name]  Assessment/Plan: Principal Problem:  Cellulitis of leg, right; recurrent. Chart review indicates admitted September of 2014 the same. Max temp 99. No leukocytosis. Non-toxic appearing. Only slight improvement in erythema. Continues with swelling/heat/tenderness. Doppler negative for DVT.  Will continue IV vancomycin day #2. continue pain management and anti emanic as needed.   Active Problems:  Obesity: BMI 51.1. Request nutritional consult   DEGENERATIVE JOINT DISEASE, RIGHT KNEE: At baseline   Code Status: full Family Communication: children at beside Disposition Plan: home when ready   Consultants:  none  Procedures:  none  Antibiotics:  Vancomycin 01/30/14>>  HPI/Subjective: Lying in bed. Reports no real improvement in pain. Reports leg "itching during the nightt"  Objective: Filed Vitals:   01/30/14 2155  BP: 99/56  Pulse: 79  Temp: 99 F (37.2 C)  Resp: 18    Intake/Output Summary (Last 24 hours) at 01/31/14 1000 Last data filed at 01/31/14 0530  Gross per 24 hour  Intake 1432.5 ml  Output    200 ml  Net 1232.5 ml   Filed Weights   01/30/14 0938  Weight: 156.491 kg (345 lb)    Exam:   General:  Appears comfortable   Cardiovascular: RRR No MGR trace LE edema r>L  Respiratory: normal effort BS clear to auscultation bilaterally no wheeze  Abdomen: obese soft +BS non-tender to palpation  Musculoskeletal: no clubbing or cyanosis  Skin: right lower extremity with erythema and edema from ankle to mid shin. More pronounced medial aspect. Diffuse tenderness and heat.    Data Reviewed: Basic Metabolic Panel:  Recent Labs Lab 01/30/14 1143 01/31/14 0620  NA 137 142  K 4.1 3.5*  CL 104 107  CO2 18* 24  GLUCOSE 109* 106*  BUN 12 8  CREATININE 0.96 0.95  CALCIUM 8.8 8.5   Liver Function Tests: No results found for  this basename: AST, ALT, ALKPHOS, BILITOT, PROT, ALBUMIN,  in the last 168 hours No results found for this basename: LIPASE, AMYLASE,  in the last 168 hours No results found for this basename: AMMONIA,  in the last 168 hours CBC:  Recent Labs Lab 01/30/14 1059 01/31/14 0620  WBC 8.9 6.6  NEUTROABS 5.2  --   HGB 12.8 12.0  HCT 39.0 37.1  MCV 86.5 86.9  PLT 295 308   Cardiac Enzymes: No results found for this basename: CKTOTAL, CKMB, CKMBINDEX, TROPONINI,  in the last 168 hours BNP (last 3 results) No results found for this basename: PROBNP,  in the last 8760 hours CBG: No results found for this basename: GLUCAP,  in the last 168 hours  No results found for this or any previous visit (from the past 240 hour(s)).   Studies: [place] Venous Img Lower Unilateral Right  01/30/2014   CLINICAL DATA:  Swelling and pain.  EXAM: RIGHT LOWER EXTREMITY VENOUS DOPPLER ULTRASOUND  TECHNIQUE: Gray-scale sonography with graded compression, as well as color Doppler and duplex ultrasound, were performed to evaluate the deep venous system from the level of the common femoral vein through the popliteal and proximal calf veins. Spectral Doppler was utilized to evaluate flow at rest and with distal augmentation maneuvers.  COMPARISON:  03/19/2013  FINDINGS: Normal compressibility, augmentation and color Doppler flow in the right common femoral vein, right femoral vein and right popliteal vein. The right great saphenous vein is compressible and patent. Limited evaluation of the calf veins. The peroneal vein was  not clearly identified. The visualized calf veins are patent.  There are multiple adjacent hypoechoic structures in the right groin. This is most compatible with a conglomeration of adjacent lymph nodes. This area measures up to 5.3 cm in greatest dimension.  IMPRESSION: Negative for right lower extremity deep vein thrombosis. Limited evaluation of the right calf veins.  Multiple hypoechoic structures in the  right groin. This appears to represent a conglomeration of adjacent lymph nodes.   Electronically Signed   By: Richarda Overlie M.D.   On: 01/30/2014 17:22    Scheduled Meds: . enoxaparin (LOVENOX) injection  80 mg Subcutaneous Q24H  . potassium chloride  40 mEq Oral Once  . senna  1 tablet Oral BID  . vancomycin  2,000 mg Intravenous Q12H   Continuous Infusions:   Principal Problem:   Cellulitis of leg, right Active Problems:   DEGENERATIVE JOINT DISEASE, RIGHT KNEE   Obesity   Hypokalemia   Cellulitis    Time spent: 35 minutes    [facility] M  [facility] Pager [phone]. If 7PM-7AM, please contact night-coverage at [email or web address], password [facility] 01/31/2014, 10:00 AM  LOS: 1 day    I personally evaluated patient and agree with the above findings. Will continue emperic antimicrobial therapy with Vancomycin as she is clinically improving.Dopplers did not show evidence for DVT.

## 2014-02-01 LAB — BASIC METABOLIC PANEL
Anion gap: 9 (ref 5–15)
BUN: 8 mg/dL (ref 6–23)
CO2: 26 mEq/L (ref 19–32)
CREATININE: 0.88 mg/dL (ref 0.50–1.10)
Calcium: 8.8 mg/dL (ref 8.4–10.5)
Chloride: 106 mEq/L (ref 96–112)
GFR calc Af Amer: >90 mL/min (ref >90)
GFR, EST NON AFRICAN AMERICAN: 79 mL/min — AB (ref >90)
GLUCOSE: 107 mg/dL — AB (ref 70–99)
Potassium: 3.9 mEq/L (ref 3.7–5.3)
Sodium: 141 mEq/L (ref 137–147)

## 2014-02-01 LAB — CBC
HCT: 38.7 % (ref 36.0–46.0)
Hemoglobin: 12.6 g/dL (ref 12.0–15.0)
MCH: 28.5 pg (ref 26.0–34.0)
MCHC: 32.6 g/dL (ref 30.0–36.0)
MCV: 87.6 fL (ref 78.0–100.0)
PLATELETS: 348 10*3/uL (ref 150–400)
RBC: 4.42 MIL/uL (ref 3.87–5.11)
RDW: 14.7 % (ref 11.5–15.5)
WBC: 6.8 10*3/uL (ref 4.0–10.5)

## 2014-02-01 MED ORDER — HYDROCODONE-ACETAMINOPHEN 5-325 MG PO TABS: 1.0000 | ORAL_TABLET | ORAL | Status: DC | PRN

## 2014-02-01 MED ORDER — DOXYCYCLINE HYCLATE 50 MG PO CAPS: 50.0000 mg | ORAL_CAPSULE | Freq: Two times a day (BID) | ORAL | Status: DC

## 2014-02-01 MED ORDER — DIPHENHYDRAMINE HCL 25 MG PO CAPS: 25.0000 mg | ORAL_CAPSULE | Freq: Three times a day (TID) | ORAL | Status: DC | PRN

## 2014-02-01 NOTE — Discharge Summary (Signed)
Physician Discharge Summary  Carrie Mitchell ZOX:[id number] DOB: November 22, 1970 DOA: 01/30/2014  PCP: Carrie Mitchell  Admit date: 01/30/2014 Discharge date: 02/01/2014  Time spent: 40 minutes  Recommendations for Outpatient Follow-up:  1. Follow up with PCP 1 week for evaluation of cellulitis on right lower leg   Discharge Diagnoses:  Principal Problem:   Cellulitis of leg, right Active Problems:   DEGENERATIVE JOINT DISEASE, RIGHT KNEE   Obesity   Hypokalemia   Cellulitis   Discharge Condition: stable  Diet recommendation: regular  Filed Weights   01/30/14 0938  Weight: 156.491 kg (345 lb)    History of present illness:  Carrie Mitchell is a 43 y.o. female with a past medical history that includes cellulitis, dermatitis presented to the emergency department on 01/30/14 with chief complaint of right lower extremity swelling pain and erythema. She denied any recent falls or trauma to the lower extremity. Associated symptoms included subjective fever chills nausea without vomiting. She reported developing redness swelling and pain in her right lower extremity gradually over the previous 2 days. She indicated that she had fever and chills initially and then the symptoms in her leg started. She was taking ibuprofen with little relief. She denied any chest pain palpitations shortness of breath. She denied any dysuria hematuria frequency or urgency. She denied abdominal pain diarrhea constipation melena or hematochezia. Her last BM was 2 days prior. She reported eating and drinking her normal amount.   Initial workup in the emergency department included basic metabolic panel which was unremarkable, complete blood count whichwas unremarkable. Urinalysis with many bacteria and few squama cells trace leukocytes 3-6 WBCs. Her vital signs were stable she was afebrile and not hypoxic. She was given morphine, Zofran, vancomycin in the emergency department   Hospital Course:  Principal  Problem:  Cellulitis of leg, right; recurrent. Chart review indicates admitted September of 2014 the same. Max temp 98.4 last 24 hours. No leukocytosis. Non-toxic appearing. Less erythema and heat to leg. Less tender.  Received 2 days IV vancomycin. Will be discharged with 10 days doxycycline to complete antibiotics. Doppler negative for DVT. Blood cultures with no growth.    Active Problems:  Obesity: BMI 51.1. Nutritional consult   DEGENERATIVE JOINT DISEASE, RIGHT KNEE:  Remained stable at baseline   Procedures:  LE doppler  Consultations:  none  Discharge Exam: Filed Vitals:   02/01/14 0413  BP: 120/67  Pulse: 71  Temp: 98.2 F (36.8 C)  Resp: 18    General: obese, appears comfortable  Cardiovascular: RRR no pitting edema, no m/g/r Respiratory: normal effort BS clear bilaterally Skin: improved erythema to right leg, less heat and tenderness. Less swelling as well   Discharge Instructions You were cared for by a hospitalist during your hospital stay. If you have any questions about your discharge medications or the care you received while you were in the hospital after you are discharged, you can call the unit and asked to speak with the hospitalist on call if the hospitalist that took care of you is not available. Once you are discharged, your primary care physician will handle any further medical issues. Please note that NO REFILLS for any discharge medications will be authorized once you are discharged, as it is imperative that you return to your primary care physician (or establish a relationship with a primary care physician if you do not have one) for your aftercare needs so that they can reassess your need for medications and monitor your lab values.  Medication List         acetaminophen-codeine 300-30 MG per tablet  Commonly known as:  TYLENOL #3  Take 1 tablet by mouth every 8 (eight) hours as needed for moderate pain.     diphenhydrAMINE 25 mg capsule   Commonly known as:  BENADRYL  Take 1 capsule (25 mg total) by mouth every 8 (eight) hours as needed for itching.     doxycycline 50 MG capsule  Commonly known as:  VIBRAMYCIN  Take 1 capsule (50 mg total) by mouth 2 (two) times daily.     HYDROcodone-acetaminophen 5-325 MG per tablet  Commonly known as:  NORCO/VICODIN  Take 1-2 tablets by mouth every 4 (four) hours as needed for moderate pain.     ibuprofen 800 MG tablet  Commonly known as:  ADVIL,MOTRIN  Take 800 mg by mouth every 8 (eight) hours as needed for moderate pain.       No Known Allergies    The results of significant diagnostics from this hospitalization (including imaging, microbiology, ancillary and laboratory) are listed below for reference.    Significant Diagnostic Studies: [place] Venous Img Lower Unilateral Right  01/30/2014   CLINICAL DATA:  Swelling and pain.  EXAM: RIGHT LOWER EXTREMITY VENOUS DOPPLER ULTRASOUND  TECHNIQUE: Gray-scale sonography with graded compression, as well as color Doppler and duplex ultrasound, were performed to evaluate the deep venous system from the level of the common femoral vein through the popliteal and proximal calf veins. Spectral Doppler was utilized to evaluate flow at rest and with distal augmentation maneuvers.  COMPARISON:  03/19/2013  FINDINGS: Normal compressibility, augmentation and color Doppler flow in the right common femoral vein, right femoral vein and right popliteal vein. The right great saphenous vein is compressible and patent. Limited evaluation of the calf veins. The peroneal vein was not clearly identified. The visualized calf veins are patent.  There are multiple adjacent hypoechoic structures in the right groin. This is most compatible with a conglomeration of adjacent lymph nodes. This area measures up to 5.3 cm in greatest dimension.  IMPRESSION: Negative for right lower extremity deep vein thrombosis. Limited evaluation of the right calf veins.  Multiple hypoechoic  structures in the right groin. This appears to represent a conglomeration of adjacent lymph nodes.   Electronically Signed   By: Richarda OverlieAdam  Henn M.D.   On: 01/30/2014 17:22    Microbiology: Recent Results (from the past 240 hour(s))  URINE CULTURE     Status: None   Collection Time    01/30/14 11:25 AM      Result Value Ref Range Status   Specimen Description URINE, CLEAN CATCH   Final   Special Requests NONE   Final   Culture  Setup Time     Final   Value: 01/30/2014 17:23     Performed at [facility]   Colony Count     Final   Value: >=100,000 COLONIES/ML     Performed at [facility]   Culture     Final   Value: Multiple bacterial morphotypes present, none predominant. Suggest appropriate recollection if clinically indicated.     Performed at [facility]   Report Status 01/31/2014 FINAL   Final  CULTURE, BLOOD (ROUTINE X 2)     Status: None   Collection Time    01/30/14  2:40 PM      Result Value Ref Range Status   Specimen Description BLOOD LEFT HAND   Final   Special Requests  Final   Value: BOTTLES DRAWN AEROBIC AND ANAEROBIC AEB=5CC ANA=4CC   Culture NO GROWTH 2 DAYS   Final   Report Status PENDING   Incomplete  CULTURE, BLOOD (ROUTINE X 2)     Status: None   Collection Time    01/30/14  3:46 PM      Result Value Ref Range Status   Specimen Description BLOOD LEFT HAND   Final   Special Requests BOTTLES DRAWN AEROBIC ONLY 4CC   Final   Culture NO GROWTH 2 DAYS   Final   Report Status PENDING   Incomplete     Labs: Basic Metabolic Panel:  Recent Labs Lab 01/30/14 1143 01/31/14 0620 02/01/14 0539  NA 137 142 141  K 4.1 3.5* 3.9  CL 104 107 106  CO2 18* 24 26  GLUCOSE 109* 106* 107*  BUN 12 8 8   CREATININE 0.96 0.95 0.88  CALCIUM 8.8 8.5 8.8   Liver Function Tests: No results found for this basename: AST, ALT, ALKPHOS, BILITOT, PROT, ALBUMIN,  in the last 168 hours No results found for this basename: LIPASE, AMYLASE,  in the last  168 hours No results found for this basename: AMMONIA,  in the last 168 hours CBC:  Recent Labs Lab 01/30/14 1059 01/31/14 0620 02/01/14 0539  WBC 8.9 6.6 6.8  NEUTROABS 5.2  --   --   HGB 12.8 12.0 12.6  HCT 39.0 37.1 38.7  MCV 86.5 86.9 87.6  PLT 295 308 348   Cardiac Enzymes: No results found for this basename: CKTOTAL, CKMB, CKMBINDEX, TROPONINI,  in the last 168 hours BNP: BNP (last 3 results) No results found for this basename: PROBNP,  in the last 8760 hours CBG: No results found for this basename: GLUCAP,  in the last 168 hours     Signed:  Gwenyth Bender  [facility] 02/01/2014, 10:33 AM    Addendum I personally examined patient on day of discharge and agree with the above findings. Extremity cellulitis improved, will discharge on Doxy.

## 2014-02-01 NOTE — Discharge Instructions (Signed)
Follow up with PCP 1 week Take medication as directed Keep right leg elevated as much as possible

## 2014-02-04 LAB — CULTURE, BLOOD (ROUTINE X 2)
Culture: NO GROWTH
Culture: NO GROWTH

## 2014-09-03 ENCOUNTER — Other Ambulatory Visit (HOSPITAL_COMMUNITY): Payer: Self-pay | Admitting: General Surgery

## 2014-09-03 DIAGNOSIS — Z1231 Encounter for screening mammogram for malignant neoplasm of breast: Secondary | ICD-10-CM

## 2014-09-10 ENCOUNTER — Ambulatory Visit (HOSPITAL_COMMUNITY): Payer: Self-pay

## 2017-10-18 ENCOUNTER — Other Ambulatory Visit: Payer: Self-pay

## 2017-10-18 ENCOUNTER — Encounter (HOSPITAL_COMMUNITY): Payer: Self-pay | Admitting: Emergency Medicine

## 2017-10-18 ENCOUNTER — Emergency Department (HOSPITAL_COMMUNITY)
Admission: EM | Admit: 2017-10-18 | Discharge: 2017-10-18 | Disposition: A | Discharge status: Home / Self Care | Payer: BLUE CROSS/BLUE SHIELD | Attending: Emergency Medicine | Admitting: Emergency Medicine

## 2017-10-18 DIAGNOSIS — L0390 Cellulitis, unspecified: Secondary | ICD-10-CM

## 2017-10-18 DIAGNOSIS — M79604 Pain in right leg: Secondary | ICD-10-CM | POA: Diagnosis not present

## 2017-10-18 DIAGNOSIS — L03115 Cellulitis of right lower limb: Secondary | ICD-10-CM | POA: Diagnosis not present

## 2017-10-18 DIAGNOSIS — Z87891 Personal history of nicotine dependence: Secondary | ICD-10-CM | POA: Diagnosis not present

## 2017-10-18 LAB — CBC WITH DIFFERENTIAL/PLATELET
BASOS ABS: 0 10*3/uL (ref 0.0–0.1)
Basophils Relative: 0 %
Eosinophils Absolute: 0.4 10*3/uL (ref 0.0–0.7)
Eosinophils Relative: 5 %
HCT: 42 % (ref 36.0–46.0)
Hemoglobin: 13.2 g/dL (ref 12.0–15.0)
Lymphocytes Relative: 38 %
Lymphs Abs: 2.9 10*3/uL (ref 0.7–4.0)
MCH: 28.2 pg (ref 26.0–34.0)
MCHC: 31.4 g/dL (ref 30.0–36.0)
MCV: 89.7 fL (ref 78.0–100.0)
Monocytes Absolute: 0.3 10*3/uL (ref 0.1–1.0)
Monocytes Relative: 4 %
Neutro Abs: 4.1 10*3/uL (ref 1.7–7.7)
Neutrophils Relative %: 53 %
Platelets: 331 10*3/uL (ref 150–400)
RBC: 4.68 MIL/uL (ref 3.87–5.11)
RDW: 14.4 % (ref 11.5–15.5)
WBC: 7.6 10*3/uL (ref 4.0–10.5)

## 2017-10-18 LAB — BASIC METABOLIC PANEL
ANION GAP: 9 (ref 5–15)
BUN: 11 mg/dL (ref 6–20)
CALCIUM: 9.1 mg/dL (ref 8.9–10.3)
CO2: 27 mmol/L (ref 22–32)
Chloride: 104 mmol/L (ref 101–111)
Creatinine, Ser: 0.9 mg/dL (ref 0.44–1.00)
GFR calc Af Amer: >60 mL/min (ref >60)
GFR calc non Af Amer: >60 mL/min (ref >60)
GLUCOSE: 138 mg/dL — AB (ref 65–99)
POTASSIUM: 3.8 mmol/L (ref 3.5–5.1)
Sodium: 140 mmol/L (ref 135–145)

## 2017-10-18 MED ORDER — CLINDAMYCIN HCL 300 MG PO CAPS: 300.0000 mg | ORAL_CAPSULE | Freq: Four times a day (QID) | ORAL | 0 refills | Status: DC

## 2017-10-18 MED ORDER — CLINDAMYCIN HCL 150 MG PO CAPS
300.0000 mg | ORAL_CAPSULE | Freq: Once | ORAL | Status: AC
  Administered 2017-10-18: 300 mg via ORAL
  Filled 2017-10-18: qty 2

## 2017-10-18 NOTE — ED Triage Notes (Signed)
Patient complaining of right leg swelling, pain, and redness since yesterday. Redness noted to lower right leg and is warm to touch at triage.

## 2017-10-18 NOTE — ED Provider Notes (Addendum)
Emergency Department Provider Note   I have reviewed the triage vital signs and the nursing notes.   HISTORY  Chief Complaint Leg Pain   HPI Carrie Mitchell is a 47 y.o. female for medical problems documented below the presents emergency department with right leg pain.  Patient states she has a history of cellulitis and this feels exactly the same.  States that it started a couple days ago with redness and then the pain seems to be getting worse.  Hurts worse with walking but also hurts worse with the sheet running over it.  She states her right leg is always more swollen than her left and not any more than normally but the redness and pain are new.  States she had been hospitalized for sialitis in the past but does not feel this is bad.  She had a bit of nausea but no fevers that she knows of.  Overall she is eating and drinking okay and no other changes. No other associated or modifying symptoms.    Past Medical History:  Diagnosis Date  . Cellulitis   . Cholelithiasis 03/18/2013  . Dermatitis 02/16/2012    Patient Active Problem List   Diagnosis Date Noted  . Cellulitis 01/30/2014  . Urinary incontinence, urge 07/20/2013  . Acute renal insufficiency 03/18/2013  . Cholelithiasis 03/18/2013  . Abdominal pain, right upper quadrant 03/18/2013  . Cellulitis of leg, right 03/18/2013  . Chest pain 03/17/2013  . Nausea & vomiting 03/17/2013  . Dermatitis 02/16/2012  . Hypokalemia 02/14/2012  . Obesity 11/09/2011  . DEGENERATIVE JOINT DISEASE, RIGHT KNEE 04/25/2009    Past Surgical History:  Procedure Laterality Date  . BREAST SURGERY    . CHOLECYSTECTOMY N/A 03/20/2013   Procedure: LAPAROSCOPIC CHOLECYSTECTOMY;  Surgeon: Fabio Bering, MD;  Location: [facility];  Service: General;  Laterality: N/A;  . TUBAL LIGATION      Current Outpatient Rx  . Order #: [id number] Class: Historical Med  . Order #: [id number] Class: Print  . Order #: [id number] Class: OTC  . Order #:  [id number] Class: Normal  . Order #: [id number] Class: Print  . Order #: [id number] Class: Historical Med    Allergies Patient has no known allergies.  Family History  Problem Relation Age of Onset  . Diabetes Mother   . Hypertension Mother   . Stroke Mother   . Heart attack Father   . Crohn's disease Sister   . Hypertension Brother   . Hypertension Brother     Social History Social History   Tobacco Use  . Smoking status: Former Smoker    Types: Cigarettes  . Smokeless tobacco: Never Used  Substance Use Topics  . Alcohol use: No  . Drug use: No    Review of Systems  All other systems negative except as documented in the HPI. All pertinent positives and negatives as reviewed in the HPI. ____________________________________________   PHYSICAL EXAM:  VITAL SIGNS: ED Triage Vitals  Enc Vitals Group     BP 10/18/17 1727 (!) 128/93     Pulse Rate 10/18/17 1727 82     Resp 10/18/17 1727 20     Temp 10/18/17 1727 98.3 F (36.8 C)     Temp Source 10/18/17 1727 Oral     SpO2 10/18/17 1727 99 %     Weight 10/18/17 1728 (!) 350 lb (158.8 kg)    Constitutional: Alert and oriented. Well appearing and in no acute distress. Eyes: Conjunctivae are normal. PERRL. EOMI. Head: Atraumatic. Nose: No congestion/rhinnorhea. Mouth/Throat:  Mucous membranes are moist.  Oropharynx non-erythematous. Neck: No stridor.  No meningeal signs.   Cardiovascular: Normal rate, regular rhythm. Good peripheral circulation. Grossly normal heart sounds.   Respiratory: Normal respiratory effort.  No retractions. Lungs CTAB. Gastrointestinal: Soft and nontender. No distention.  Musculoskeletal: RLE edema (chronic). transmetacarpal amputation from birth on right hand. Neurologic:  Normal speech and language. No gross focal neurologic deficits are appreciated.  Skin:  Skin is warm, dry and intact. No rash noted.   ____________________________________________   LABS (all labs ordered are listed, but  only abnormal results are displayed)  Labs Reviewed  BASIC METABOLIC PANEL - Abnormal; Notable for the following components:      Result Value   Glucose, Bld 138 (*)    All other components within normal limits  CBC WITH DIFFERENTIAL/PLATELET   ____________________________________________  RADIOLOGY  No results found.  ____________________________________________   PROCEDURES  Procedure(s) performed:   Procedures   ____________________________________________   INITIAL IMPRESSION / ASSESSMENT AND PLAN / ED COURSE  Likely celluliitis. No e/o sepsis, nec fasc or indication for admission. Will start antibiotics, return for worsening or not improving. Site marked. Low suspicion for DVT since it is so similar to previous episodes of cellulitis, leg is swollen at normal range and the redness/pain is not really in the same spot for DVT and not circumferential.   Pertinent labs & imaging results that were available during my care of the patient were reviewed by me and considered in my medical decision making (see chart for details).  ____________________________________________  FINAL CLINICAL IMPRESSION(S) / ED DIAGNOSES  Final diagnoses:  Cellulitis, unspecified cellulitis site     MEDICATIONS GIVEN DURING THIS VISIT:  Medications  clindamycin (CLEOCIN) capsule 300 mg (has no administration in time range)     NEW OUTPATIENT MEDICATIONS STARTED DURING THIS VISIT:  New Prescriptions   CLINDAMYCIN (CLEOCIN) 300 MG CAPSULE    Take 1 capsule (300 mg total) by mouth 4 (four) times daily. X 7 days    Note:  This note was prepared with assistance of Dragon voice recognition software. Occasional wrong-word or sound-a-like substitutions may have occurred due to the inherent limitations of voice recognition software.   Marily Memos, MD 10/18/17 1932    Marily Memos, MD 10/18/17 [id number]

## 2017-11-01 DIAGNOSIS — Z1389 Encounter for screening for other disorder: Secondary | ICD-10-CM | POA: Diagnosis not present

## 2017-11-01 DIAGNOSIS — L03119 Cellulitis of unspecified part of limb: Secondary | ICD-10-CM | POA: Diagnosis not present

## 2017-11-01 DIAGNOSIS — R609 Edema, unspecified: Secondary | ICD-10-CM | POA: Diagnosis not present

## 2017-11-01 DIAGNOSIS — Z6841 Body Mass Index (BMI) 40.0 and over, adult: Secondary | ICD-10-CM | POA: Diagnosis not present

## 2017-12-21 ENCOUNTER — Other Ambulatory Visit (HOSPITAL_COMMUNITY): Payer: Self-pay | Admitting: Family Medicine

## 2017-12-21 DIAGNOSIS — Z1231 Encounter for screening mammogram for malignant neoplasm of breast: Secondary | ICD-10-CM

## 2017-12-22 ENCOUNTER — Ambulatory Visit (HOSPITAL_COMMUNITY)
Admission: RE | Admit: 2017-12-22 | Discharge: 2017-12-22 | Disposition: A | Discharge status: Home / Self Care | Payer: BLUE CROSS/BLUE SHIELD | Source: Ambulatory Visit | Attending: Family Medicine | Admitting: Family Medicine

## 2017-12-22 DIAGNOSIS — Z1231 Encounter for screening mammogram for malignant neoplasm of breast: Secondary | ICD-10-CM | POA: Insufficient documentation

## 2018-01-27 DIAGNOSIS — Z1389 Encounter for screening for other disorder: Secondary | ICD-10-CM | POA: Diagnosis not present

## 2018-01-27 DIAGNOSIS — Z23 Encounter for immunization: Secondary | ICD-10-CM | POA: Diagnosis not present

## 2018-01-27 DIAGNOSIS — Z6841 Body Mass Index (BMI) 40.0 and over, adult: Secondary | ICD-10-CM | POA: Diagnosis not present

## 2018-01-27 DIAGNOSIS — L03115 Cellulitis of right lower limb: Secondary | ICD-10-CM | POA: Diagnosis not present

## 2018-01-29 ENCOUNTER — Encounter (HOSPITAL_COMMUNITY): Payer: Self-pay | Admitting: Emergency Medicine

## 2018-01-29 ENCOUNTER — Emergency Department (HOSPITAL_COMMUNITY)
Admission: EM | Admit: 2018-01-29 | Discharge: 2018-01-29 | Disposition: A | Discharge status: Home / Self Care | Payer: BLUE CROSS/BLUE SHIELD | Attending: Emergency Medicine | Admitting: Emergency Medicine

## 2018-01-29 ENCOUNTER — Other Ambulatory Visit: Payer: Self-pay

## 2018-01-29 ENCOUNTER — Emergency Department (HOSPITAL_COMMUNITY): Payer: BLUE CROSS/BLUE SHIELD

## 2018-01-29 DIAGNOSIS — Z87891 Personal history of nicotine dependence: Secondary | ICD-10-CM | POA: Insufficient documentation

## 2018-01-29 DIAGNOSIS — I808 Phlebitis and thrombophlebitis of other sites: Secondary | ICD-10-CM | POA: Diagnosis not present

## 2018-01-29 DIAGNOSIS — Z79899 Other long term (current) drug therapy: Secondary | ICD-10-CM | POA: Insufficient documentation

## 2018-01-29 DIAGNOSIS — R2241 Localized swelling, mass and lump, right lower limb: Secondary | ICD-10-CM | POA: Diagnosis not present

## 2018-01-29 DIAGNOSIS — M1711 Unilateral primary osteoarthritis, right knee: Secondary | ICD-10-CM | POA: Diagnosis not present

## 2018-01-29 LAB — CBC WITH DIFFERENTIAL/PLATELET
Basophils Absolute: 0 10*3/uL (ref 0.0–0.1)
Basophils Relative: 0 %
EOS ABS: 0.3 10*3/uL (ref 0.0–0.7)
Eosinophils Relative: 4 %
HCT: 40.7 % (ref 36.0–46.0)
HEMOGLOBIN: 13.1 g/dL (ref 12.0–15.0)
LYMPHS ABS: 2.6 10*3/uL (ref 0.7–4.0)
Lymphocytes Relative: 32 %
MCH: 28.7 pg (ref 26.0–34.0)
MCHC: 32.2 g/dL (ref 30.0–36.0)
MCV: 89.3 fL (ref 78.0–100.0)
MONO ABS: 0.5 10*3/uL (ref 0.1–1.0)
MONOS PCT: 6 %
Neutro Abs: 4.8 10*3/uL (ref 1.7–7.7)
Neutrophils Relative %: 58 %
Platelets: 368 10*3/uL (ref 150–400)
RBC: 4.56 MIL/uL (ref 3.87–5.11)
RDW: 14.7 % (ref 11.5–15.5)
WBC: 8.3 10*3/uL (ref 4.0–10.5)

## 2018-01-29 LAB — BASIC METABOLIC PANEL
Anion gap: 6 (ref 5–15)
BUN: 13 mg/dL (ref 6–20)
CHLORIDE: 105 mmol/L (ref 98–111)
CO2: 28 mmol/L (ref 22–32)
CREATININE: 1.01 mg/dL — AB (ref 0.44–1.00)
Calcium: 9.1 mg/dL (ref 8.9–10.3)
GFR calc Af Amer: >60 mL/min (ref >60)
GFR calc non Af Amer: >60 mL/min (ref >60)
Glucose, Bld: 106 mg/dL — ABNORMAL HIGH (ref 70–99)
Potassium: 3.6 mmol/L (ref 3.5–5.1)
Sodium: 139 mmol/L (ref 135–145)

## 2018-01-29 MED ORDER — DICLOFENAC SODIUM 75 MG PO TBEC: 75.0000 mg | DELAYED_RELEASE_TABLET | Freq: Two times a day (BID) | ORAL | 0 refills | Status: DC

## 2018-01-29 MED ORDER — HYDROCODONE-ACETAMINOPHEN 5-325 MG PO TABS
2.0000 | ORAL_TABLET | Freq: Once | ORAL | Status: AC
  Administered 2018-01-29: 2 via ORAL
  Filled 2018-01-29: qty 2

## 2018-01-29 MED ORDER — ONDANSETRON HCL 4 MG PO TABS
4.0000 mg | ORAL_TABLET | Freq: Once | ORAL | Status: AC
  Administered 2018-01-29: 4 mg via ORAL
  Filled 2018-01-29: qty 1

## 2018-01-29 MED ORDER — HYDROCODONE-ACETAMINOPHEN 5-325 MG PO TABS: 1.0000 | ORAL_TABLET | ORAL | 0 refills | Status: DC | PRN

## 2018-01-29 NOTE — Discharge Instructions (Addendum)
Your vital signs are within normal limits.  Your white blood cell count and your chemistries are both nonacute.  The x-ray of your lower leg is negative for signs of infection or injury, or lesions to the bone.  I suspect that you have a superficial thrombophlebitis probably related to varicose veins.  Please use the Ace wrap to your lower right leg over the next 5 or 6 days.  Please keep your legs elevated above your waist when sitting, and above your heart when lying down.  Please use diclofenac 2 times daily with food over the next 5 or 6 days.  You may use Norco for more severe pain.  Norco may cause drowsiness, please use this medication with caution.  Please follow-up with your primary physician if any changes in your condition, problems, or concerns.

## 2018-01-29 NOTE — ED Provider Notes (Signed)
[facility] Provider Note   CSN: [id number] Arrival date & time: 01/29/18  1547     History   Chief Complaint Chief Complaint  Patient presents with  . Leg Swelling    HPI Carrie Mitchell is a 47 y.o. female.  Patient is a 47 year old female who presents to the emergency department with a complaint of pain and swelling of the right lower leg.  The patient states she had been having some soreness in her leg during the beginning of the week.  On Wednesday August 7 she noted a knot on just above her ankle on the right.  On Thursday she went to see her primary physician.  She was concerned that this may be cellulitis, as she has had problems with cellulitis in the past.  The patient was placed on doxycycline, and a circle was drawn around the reddened area.  She was given instructions that if the pain increased, if she had fever, or if the swelling or redness went outside to circle to come back to the doctor's office or go to the emergency department.  Today she noticed that it seems as though the redness and swelling were outside the circle so she came to the emergency department.  The patient denies any fever.  She is unsure about chills, because she says she has been cold a lot lately.  There is been no drainage from the area.  The patient does not recall any direct injury or trauma to the area.  She has not had any recent operations or procedures.  She presents now for additional evaluation concerning the swollen area and pain of her lower extremity.  The history is provided by the patient.    Past Medical History:  Diagnosis Date  . Cellulitis   . Cholelithiasis 03/18/2013  . Dermatitis 02/16/2012    Patient Active Problem List   Diagnosis Date Noted  . Cellulitis 01/30/2014  . Urinary incontinence, urge 07/20/2013  . Acute renal insufficiency 03/18/2013  . Cholelithiasis 03/18/2013  . Abdominal pain, right upper quadrant 03/18/2013  . Cellulitis of leg,  right 03/18/2013  . Chest pain 03/17/2013  . Nausea & vomiting 03/17/2013  . Dermatitis 02/16/2012  . Hypokalemia 02/14/2012  . Obesity 11/09/2011  . DEGENERATIVE JOINT DISEASE, RIGHT KNEE 04/25/2009    Past Surgical History:  Procedure Laterality Date  . BREAST SURGERY    . CHOLECYSTECTOMY N/A 03/20/2013   Procedure: LAPAROSCOPIC CHOLECYSTECTOMY;  Surgeon: Fabio BeringBrent C Ziegler, MD;  Location: [facility];  Service: General;  Laterality: N/A;  . TUBAL LIGATION       OB History   None      Home Medications    Prior to Admission medications   Medication Sig Start Date End Date Taking? Authorizing Provider  acetaminophen-codeine (TYLENOL #3) 300-30 MG per tablet Take 1 tablet by mouth every 8 (eight) hours as needed for moderate pain.    [provider]  clindamycin (CLEOCIN) 300 MG capsule Take 1 capsule (300 mg total) by mouth 4 (four) times daily. X 7 days 10/18/17   Mesner, Barbara CowerJason, MD  diphenhydrAMINE (BENADRYL) 25 mg capsule Take 1 capsule (25 mg total) by mouth every 8 (eight) hours as needed for itching. 02/01/14   Black, Lesle ChrisKaren M, NP  doxycycline (VIBRAMYCIN) 50 MG capsule Take 1 capsule (50 mg total) by mouth 2 (two) times daily. 02/01/14   Black, Lesle ChrisKaren M, NP  HYDROcodone-acetaminophen (NORCO/VICODIN) 5-325 MG per tablet Take 1-2 tablets by mouth every 4 (four)  hours as needed for moderate pain. 02/01/14   Black, Lesle Chris, NP  ibuprofen (ADVIL,MOTRIN) 800 MG tablet Take 800 mg by mouth every 8 (eight) hours as needed for moderate pain.    [provider]    Family History Family History  Problem Relation Age of Onset  . Diabetes Mother   . Hypertension Mother   . Stroke Mother   . Heart attack Father   . Crohn's disease Sister   . Hypertension Brother   . Hypertension Brother     Social History Social History   Tobacco Use  . Smoking status: Former Smoker    Types: Cigarettes  . Smokeless tobacco: Never Used  Substance Use Topics  . Alcohol use: No  .  Drug use: No     Allergies   Patient has no known allergies.   Review of Systems Review of Systems  Constitutional: Negative for activity change.       All ROS Neg except as noted in HPI  HENT: Negative for nosebleeds.   Eyes: Negative for photophobia and discharge.  Respiratory: Negative for cough, shortness of breath and wheezing.   Cardiovascular: Negative for chest pain and palpitations.  Gastrointestinal: Negative for abdominal pain and blood in stool.  Genitourinary: Negative for dysuria, frequency and hematuria.  Musculoskeletal: Positive for myalgias. Negative for arthralgias, back pain and neck pain.       Leg pain  Skin: Negative.   Neurological: Negative for dizziness, seizures and speech difficulty.  Psychiatric/Behavioral: Negative for confusion and hallucinations.     Physical Exam Updated Vital Signs BP 133/85 (BP Location: Right Arm)   Pulse 82   Temp 98.5 F (36.9 C) (Oral)   Resp 18   Ht 5' 9.5" (1.765 m)   Wt (!) 154.2 kg   LMP 07/03/2013   SpO2 100%   BMI 49.49 kg/m   Physical Exam  Constitutional: She is oriented to person, place, and time. She appears well-developed and well-nourished.  Non-toxic appearance.  HENT:  Head: Normocephalic.  Right Ear: Tympanic membrane and external ear normal.  Left Ear: Tympanic membrane and external ear normal.  Eyes: Pupils are equal, round, and reactive to light. EOM and lids are normal.  Neck: Normal range of motion. Neck supple. Carotid bruit is not present.  Cardiovascular: Normal rate, regular rhythm, normal heart sounds, intact distal pulses and normal pulses.  Pulmonary/Chest: Breath sounds normal. No respiratory distress.  Abdominal: Soft. Bowel sounds are normal. There is no tenderness. There is no guarding.  Musculoskeletal: Normal range of motion.  There is some puffiness of the right lower extremity from the knee to the ankle.  There is a slightly raised area with increased darkness of the pigment  on the lateral surface of the lower tib-fib area just above the ankle.  There is minimal warmth to the area.  There is no red streaks noted.  This is in the area of multiple varicose veins.  The dorsalis pedis pulses 2+.  There are no lesions between the toes.  There is no lesions of the plantar surface.  There is full range of motion of the toes and ankle.  Lymphadenopathy:       Head (right side): No submandibular adenopathy present.       Head (left side): No submandibular adenopathy present.    She has no cervical adenopathy.  Neurological: She is alert and oriented to person, place, and time. She has normal strength. No cranial nerve deficit or sensory deficit.  Skin: Skin is warm and dry.  Psychiatric: She has a normal mood and affect. Her speech is normal.  Nursing note and vitals reviewed.    ED Treatments / Results  Labs (all labs ordered are listed, but only abnormal results are displayed) Labs Reviewed - No data to display  EKG None  Radiology No results found.  Procedures Procedures (including critical care time)  Medications Ordered in ED Medications - No data to display   Initial Impression / Assessment and Plan / ED Course  I have reviewed the triage vital signs and the nursing notes.  Pertinent labs & imaging results that were available during my care of the patient were reviewed by me and considered in my medical decision making (see chart for details).       Final Clinical Impressions(s) / ED Diagnoses MDM  Vital signs within normal limits.  The patient has a sore raised area on the lateral portion of the right lower extremity.  Will evaluate for possible infection.  Will obtain x-ray to rule out any gas formation.  Will check complete blood count and basic metabolic panel at this time.  Medication for pain given to the patient.  X-ray of the tib-fib area is negative for any bony abnormality.  The soft tissues are well within normal limits.  No gas noted  whatsoever.  The complete blood count is well within normal limits.  The basic metabolic panel is nonacute.  Recheck.  Patient states the pain is improving after taking the medication.  I discussed with the patient the findings of the test and the findings of the examination.  I discussed with her the possibility of this being related to superficial thrombophlebitis related to varicose veins.  The patient is ordered an Ace wrap to the lower leg area.  The patient is also ordered diclofenac 2 times daily with food for about 5 days.  The patient will use Norco (10 tablets) every 4-6 hours for more severe pain.  I have asked the patient to follow-up with her primary physician or return to the emergency department if any changes in condition, problems, or concerns.   Final diagnoses:  Superficial thrombophlebitis of right upper extremity    ED Discharge Orders         Ordered    diclofenac (VOLTAREN) 75 MG EC tablet  2 times daily     01/29/18 1802    HYDROcodone-acetaminophen (NORCO/VICODIN) 5-325 MG tablet  Every 4 hours PRN     01/29/18 1802           Ivery Quale, [name] 01/29/18 1809    Mancel Bale, MD 01/29/18 2010

## 2018-01-29 NOTE — ED Notes (Signed)
To Rad 

## 2018-01-29 NOTE — ED Triage Notes (Addendum)
Patient states that she noticed a knot on ankle Wednesday and went to PCP on Thursday and started antibiotics (doxycycline), area was circled. Patient was told by PCP that if redness went outside of circle, she started running a fever, or area increased in size and swelling to come to ED.

## 2018-01-29 NOTE — ED Notes (Signed)
Pt reports that she has been to her physician for redness and swelling to her R lower leg  She reports that her physician put a circle around her leg and started her on Doxycycline  She feels that the redness to her leg is exceeding the circle that her physician put around the redness  Has no fever nor feeling of illness  Pt works as a Lawyer[employer]NA

## 2018-09-18 IMAGING — DX DG TIBIA/FIBULA 2V*R*
4 series · 4 of 4 positions shown · non-contrast
Comparison: None.

CLINICAL DATA: Redness of lower extremity for 2 weeks.

EXAM:
RIGHT TIBIA AND FIBULA - 2 VIEW

[tibia ap (1 of 2)]
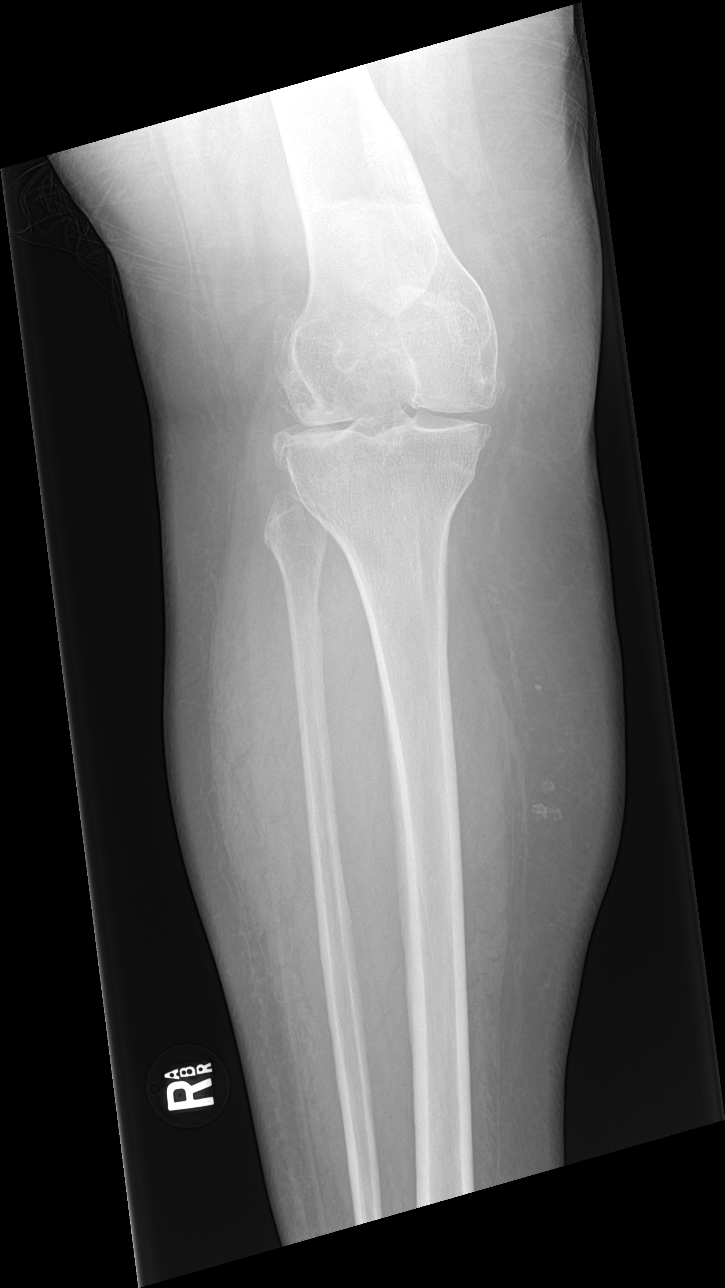

[tibia ap (2 of 2)]
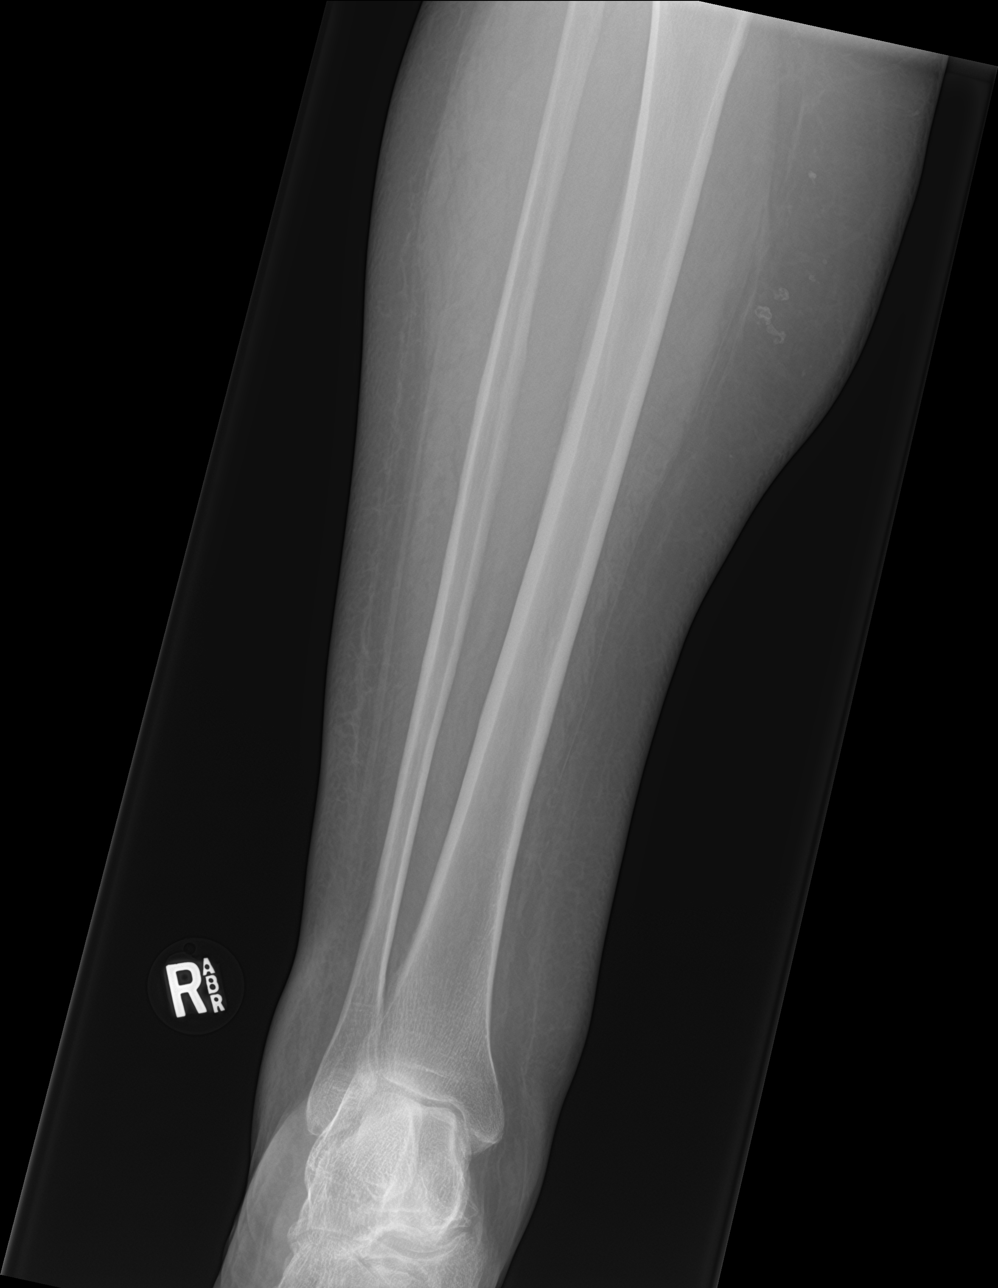

[tibia lat (1 of 2)]
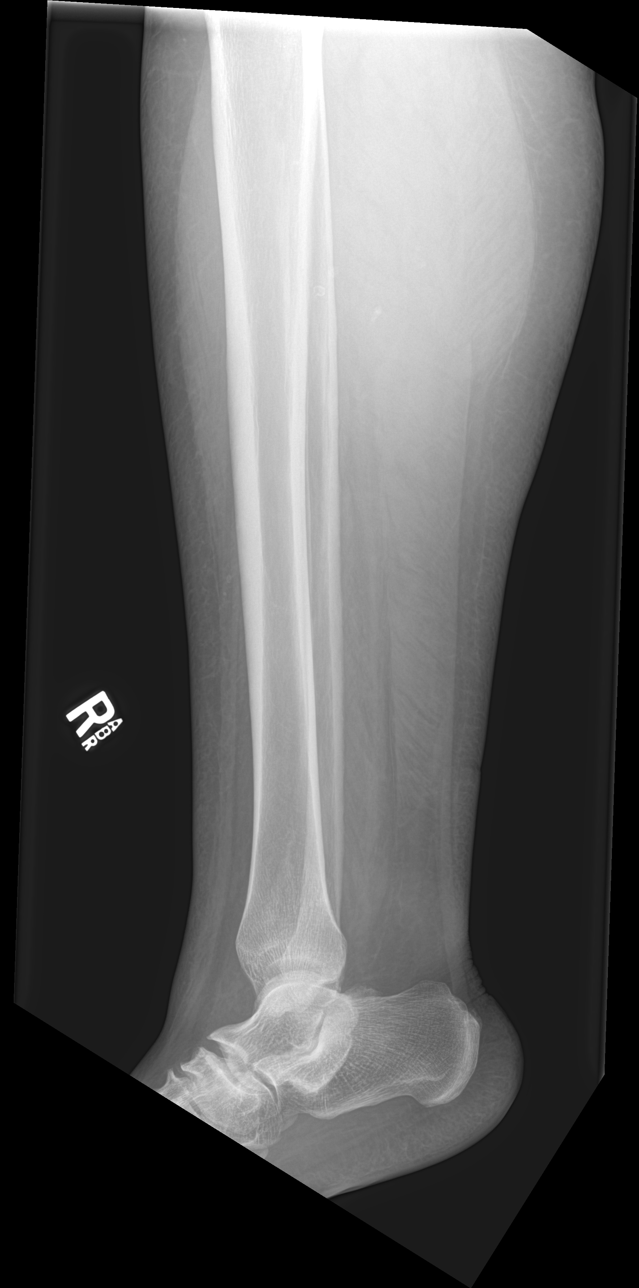

[tibia lat (2 of 2)]
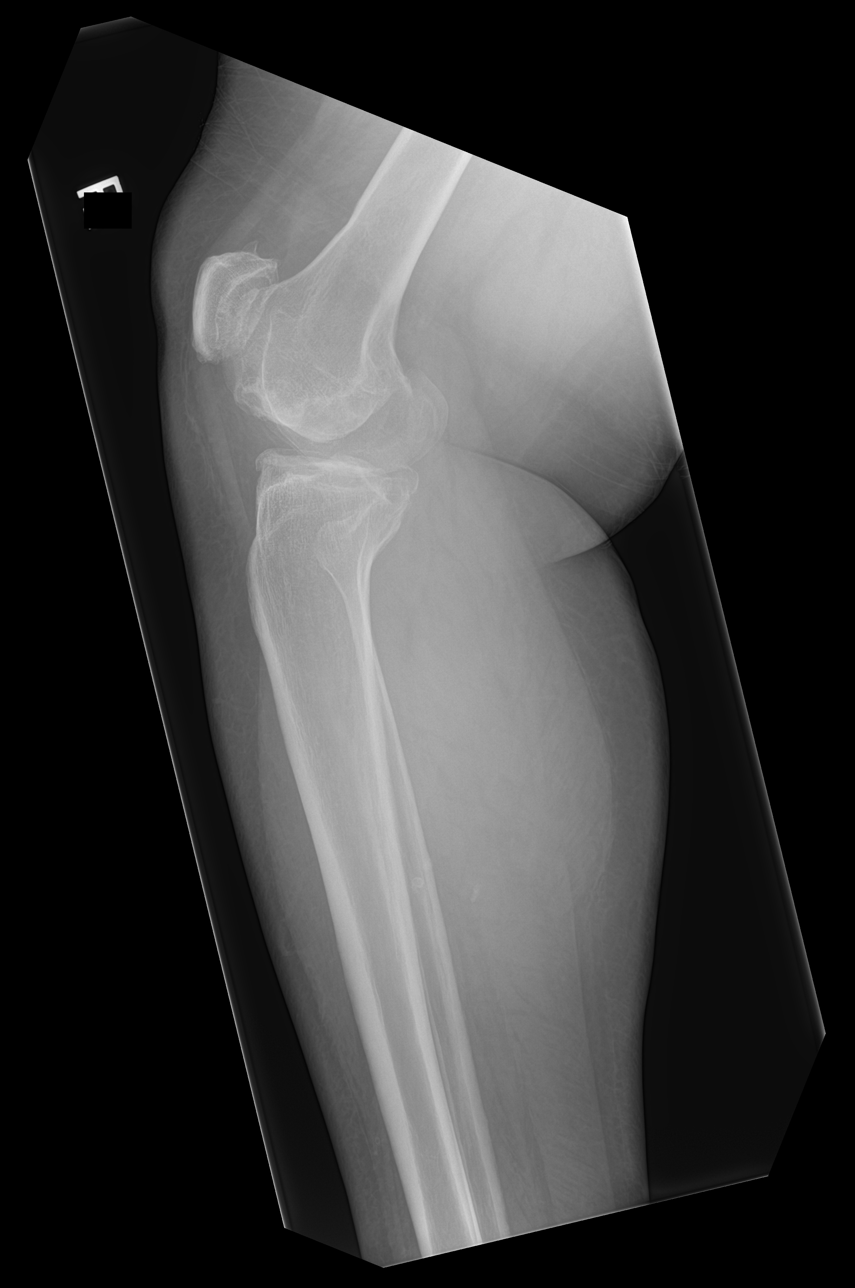

[4 of 4 positions shown; findings below may reference images not displayed]

FINDINGS: There is no evidence of fracture or dislocation. Degenerative joint
changes of the right knee are identified. Soft tissues are
unremarkable.
IMPRESSION: No acute abnormality noted on x-ray.

## 2018-12-15 DIAGNOSIS — Z1389 Encounter for screening for other disorder: Secondary | ICD-10-CM | POA: Diagnosis not present

## 2018-12-15 DIAGNOSIS — L0390 Cellulitis, unspecified: Secondary | ICD-10-CM | POA: Diagnosis not present

## 2018-12-15 DIAGNOSIS — Z6841 Body Mass Index (BMI) 40.0 and over, adult: Secondary | ICD-10-CM | POA: Diagnosis not present

## 2019-01-12 DIAGNOSIS — Z1389 Encounter for screening for other disorder: Secondary | ICD-10-CM | POA: Diagnosis not present

## 2019-01-12 DIAGNOSIS — Z0001 Encounter for general adult medical examination with abnormal findings: Secondary | ICD-10-CM | POA: Diagnosis not present

## 2019-01-12 DIAGNOSIS — R739 Hyperglycemia, unspecified: Secondary | ICD-10-CM | POA: Diagnosis not present

## 2019-01-12 DIAGNOSIS — Z6841 Body Mass Index (BMI) 40.0 and over, adult: Secondary | ICD-10-CM | POA: Diagnosis not present

## 2019-01-12 DIAGNOSIS — R7309 Other abnormal glucose: Secondary | ICD-10-CM | POA: Diagnosis not present

## 2019-01-12 DIAGNOSIS — L03115 Cellulitis of right lower limb: Secondary | ICD-10-CM | POA: Diagnosis not present

## 2019-01-20 DIAGNOSIS — E119 Type 2 diabetes mellitus without complications: Secondary | ICD-10-CM | POA: Diagnosis not present

## 2019-01-20 DIAGNOSIS — Z6841 Body Mass Index (BMI) 40.0 and over, adult: Secondary | ICD-10-CM | POA: Diagnosis not present

## 2019-01-20 DIAGNOSIS — Z1389 Encounter for screening for other disorder: Secondary | ICD-10-CM | POA: Diagnosis not present

## 2019-02-14 ENCOUNTER — Other Ambulatory Visit: Payer: Self-pay

## 2019-02-14 DIAGNOSIS — Z20822 Contact with and (suspected) exposure to covid-19: Secondary | ICD-10-CM

## 2019-02-15 LAB — NOVEL CORONAVIRUS, NAA: SARS-CoV-2, NAA: NOT DETECTED

## 2019-03-01 ENCOUNTER — Other Ambulatory Visit: Payer: Self-pay

## 2019-03-01 DIAGNOSIS — Z20822 Contact with and (suspected) exposure to covid-19: Secondary | ICD-10-CM

## 2019-03-02 LAB — NOVEL CORONAVIRUS, NAA: SARS-CoV-2, NAA: NOT DETECTED

## 2019-03-14 ENCOUNTER — Other Ambulatory Visit: Payer: Self-pay

## 2019-03-14 DIAGNOSIS — Z20822 Contact with and (suspected) exposure to covid-19: Secondary | ICD-10-CM

## 2019-03-15 LAB — NOVEL CORONAVIRUS, NAA: SARS-CoV-2, NAA: NOT DETECTED

## 2019-03-28 ENCOUNTER — Other Ambulatory Visit: Payer: Self-pay

## 2019-03-28 DIAGNOSIS — Z20822 Contact with and (suspected) exposure to covid-19: Secondary | ICD-10-CM

## 2019-03-30 LAB — NOVEL CORONAVIRUS, NAA: SARS-CoV-2, NAA: NOT DETECTED

## 2019-04-11 ENCOUNTER — Other Ambulatory Visit: Payer: Self-pay

## 2019-04-11 DIAGNOSIS — Z20822 Contact with and (suspected) exposure to covid-19: Secondary | ICD-10-CM

## 2019-04-12 LAB — NOVEL CORONAVIRUS, NAA: SARS-CoV-2, NAA: NOT DETECTED

## 2019-04-17 ENCOUNTER — Other Ambulatory Visit: Payer: Self-pay

## 2019-04-17 ENCOUNTER — Encounter (HOSPITAL_COMMUNITY): Payer: Self-pay

## 2019-04-17 ENCOUNTER — Emergency Department (HOSPITAL_COMMUNITY): Payer: 59

## 2019-04-17 ENCOUNTER — Emergency Department (HOSPITAL_COMMUNITY)
Admission: EM | Admit: 2019-04-17 | Discharge: 2019-04-17 | Disposition: A | Discharge status: Home / Self Care | Payer: 59 | Attending: Emergency Medicine | Admitting: Emergency Medicine

## 2019-04-17 DIAGNOSIS — L03115 Cellulitis of right lower limb: Secondary | ICD-10-CM | POA: Insufficient documentation

## 2019-04-17 DIAGNOSIS — M79604 Pain in right leg: Secondary | ICD-10-CM | POA: Diagnosis present

## 2019-04-17 DIAGNOSIS — Z87891 Personal history of nicotine dependence: Secondary | ICD-10-CM | POA: Diagnosis not present

## 2019-04-17 DIAGNOSIS — Z79899 Other long term (current) drug therapy: Secondary | ICD-10-CM | POA: Insufficient documentation

## 2019-04-17 LAB — CBG MONITORING, ED: Glucose-Capillary: 144 mg/dL — ABNORMAL HIGH (ref 70–99)

## 2019-04-17 MED ORDER — CLINDAMYCIN HCL 300 MG PO CAPS: 300.0000 mg | ORAL_CAPSULE | Freq: Four times a day (QID) | ORAL | 0 refills | Status: AC

## 2019-04-17 NOTE — ED Triage Notes (Signed)
Pt reports she has a knot on her right lower leg. Lower leg has been swollen and red since yesterday. Pt has hx of cellulitis

## 2019-04-17 NOTE — ED Provider Notes (Signed)
[facility] Provider Note   CSN: [id number] Arrival date & time: 04/17/19  1134     History   Chief Complaint Chief Complaint  Patient presents with  . Leg Pain    HPI Carrie Mitchell is a 48 y.o. female with past medical history significant for cellulitis and dermatitis presents to emergency department today with chief complaint of right leg pain x 2 days.  She has history of cellulitis to right leg with most recent being in 10/18/2017.  She states the pain in her leg today feels very similar.  She states the pain started yesterday and has progressively worsened since onset.  She describes the pain as sharp. Pain is located in her right shin and is constant.  She states pain is worse when ambulating. She has not taken any medications for her symptoms prior to arrival.  She denies fever, chills, numbness, tingling, weakness, recent falls or injury.  She is has history of prediabetes reports her last A1c was 6. She does admit to chronic right lower extremity edema after an injury over a year ago.  She states her leg seems slightly more swollen than usual today.  History provided by patient with additional history obtained from chart review.     Past Medical History:  Diagnosis Date  . Cellulitis   . Cholelithiasis 03/18/2013  . Dermatitis 02/16/2012    Patient Active Problem List   Diagnosis Date Noted  . Cellulitis 01/30/2014  . Urinary incontinence, urge 07/20/2013  . Acute renal insufficiency 03/18/2013  . Cholelithiasis 03/18/2013  . Abdominal pain, right upper quadrant 03/18/2013  . Cellulitis of leg, right 03/18/2013  . Chest pain 03/17/2013  . Nausea & vomiting 03/17/2013  . Dermatitis 02/16/2012  . Hypokalemia 02/14/2012  . Obesity 11/09/2011  . DEGENERATIVE JOINT DISEASE, RIGHT KNEE 04/25/2009    Past Surgical History:  Procedure Laterality Date  . BREAST SURGERY    . CHOLECYSTECTOMY N/A 03/20/2013   Procedure: LAPAROSCOPIC  CHOLECYSTECTOMY;  Surgeon: Fabio BeringBrent C Ziegler, MD;  Location: [facility];  Service: General;  Laterality: N/A;  . TUBAL LIGATION       OB History   No obstetric history on file.      Home Medications    Prior to Admission medications   Medication Sig Start Date End Date Taking? Authorizing Provider  acetaminophen-codeine (TYLENOL #3) 300-30 MG per tablet Take 1 tablet by mouth every 8 (eight) hours as needed for moderate pain.    [provider]  clindamycin (CLEOCIN) 300 MG capsule Take 1 capsule (300 mg total) by mouth every 6 (six) hours for 7 days. 04/17/19 04/24/19  Albrizze, Caroleen HammanKaitlyn E, [name]  diclofenac (VOLTAREN) 75 MG EC tablet Take 1 tablet (75 mg total) by mouth 2 (two) times daily. 01/29/18   Ivery QualeBryant, Hobson, [name]  diphenhydrAMINE (BENADRYL) 25 mg capsule Take 1 capsule (25 mg total) by mouth every 8 (eight) hours as needed for itching. 02/01/14   Black, Lesle ChrisKaren M, NP  doxycycline (VIBRAMYCIN) 50 MG capsule Take 1 capsule (50 mg total) by mouth 2 (two) times daily. 02/01/14   Black, Lesle ChrisKaren M, NP  HYDROcodone-acetaminophen (NORCO/VICODIN) 5-325 MG tablet Take 1 tablet by mouth every 4 (four) hours as needed. 01/29/18   Ivery QualeBryant, Hobson, [name]  ibuprofen (ADVIL,MOTRIN) 800 MG tablet Take 800 mg by mouth every 8 (eight) hours as needed for moderate pain.    [provider]    Family History Family History  Problem Relation Age of Onset  .  Diabetes Mother   . Hypertension Mother   . Stroke Mother   . Heart attack Father   . Crohn's disease Sister   . Hypertension Brother   . Hypertension Brother     Social History Social History   Tobacco Use  . Smoking status: Former Smoker    Types: Cigarettes  . Smokeless tobacco: Never Used  Substance Use Topics  . Alcohol use: No  . Drug use: No     Allergies   Patient has no known allergies.   Review of Systems Review of Systems  Constitutional: Negative for chills and fever.  HENT: Negative for congestion, ear  discharge, ear pain, sinus pressure, sinus pain and sore throat.   Eyes: Negative for pain and redness.  Respiratory: Negative for cough and shortness of breath.   Cardiovascular: Negative for chest pain.  Gastrointestinal: Negative for abdominal pain, constipation, diarrhea, nausea and vomiting.  Genitourinary: Negative for dysuria and hematuria.  Musculoskeletal: Positive for arthralgias. Negative for back pain, gait problem and neck pain.  Skin: Negative for wound.  Neurological: Negative for weakness, numbness and headaches.     Physical Exam Updated Vital Signs BP 134/88 (BP Location: Left Arm)   Pulse 83   Temp 98.1 F (36.7 C) (Oral)   Resp 19   Ht 5\' 10"  (1.778 m)   Wt (!) 156.5 kg   LMP 07/03/2013   SpO2 97%   BMI 49.50 kg/m   Physical Exam Vitals signs and nursing note reviewed.  Constitutional:      General: She is not in acute distress.    Appearance: She is not ill-appearing.  HENT:     Head: Normocephalic and atraumatic.     Right Ear: Tympanic membrane and external ear normal.     Left Ear: Tympanic membrane and external ear normal.     Nose: Nose normal.     Mouth/Throat:     Mouth: Mucous membranes are moist.     Pharynx: Oropharynx is clear.  Eyes:     General: No scleral icterus.       Right eye: No discharge.        Left eye: No discharge.     Extraocular Movements: Extraocular movements intact.     Conjunctiva/sclera: Conjunctivae normal.     Pupils: Pupils are equal, round, and reactive to light.  Neck:     Musculoskeletal: Normal range of motion.     Vascular: No JVD.  Cardiovascular:     Rate and Rhythm: Normal rate and regular rhythm.     Pulses: Normal pulses.          Radial pulses are 2+ on the right side and 2+ on the left side.       Dorsalis pedis pulses are 2+ on the right side and 2+ on the left side.     Heart sounds: Normal heart sounds.  Pulmonary:     Comments: Lungs clear to auscultation in all fields. Symmetric chest rise.  No wheezing, rales, or rhonchi. Abdominal:     Comments: Abdomen is soft, non-distended, and non-tender in all quadrants. No rigidity, no guarding. No peritoneal signs.  Musculoskeletal: Normal range of motion.     Right knee: Normal.     Right ankle: Normal.     Right lower leg: 1+ Edema present.     Left lower leg: No edema.     Comments: Transmetacarpal amputation of right hand, from birth  No break in skin or open wounds on right  lower extremity.  Right shin is erythematous, warm to the touch. No drainage or discharge noted to leg. Compartments are soft.    Skin:    General: Skin is warm and dry.     Capillary Refill: Capillary refill takes less than 2 seconds.  Neurological:     Mental Status: She is oriented to person, place, and time.     GCS: GCS eye subscore is 4. GCS verbal subscore is 5. GCS motor subscore is 6.     Comments: Fluent speech, no facial droop.  Psychiatric:        Behavior: Behavior normal.      ED Treatments / Results  Labs (all labs ordered are listed, but only abnormal results are displayed) Labs Reviewed  CBG MONITORING, ED - Abnormal; Notable for the following components:      Result Value   Glucose-Capillary 144 (*)    All other components within normal limits    EKG None  Radiology US Venous Img Lower Unilateral Right  Result Date: 04/17/2019 CLINICAL DATA:  Right calf pain, and edema EXAM: RIGHT LOWER EXTREMITY VENOUS DOPPLER ULTRASOUND TECHNIQUE: Gray-scale sonography with graded compression, as well as color Doppler and duplex ultrasound were performed to evaluate the lower extremity deep venous systems from the level of the common femoral vein and including the common femoral, femoral, profunda femoral, popliteal and calf veins including the posterior tibial, peroneal and gastrocnemius veins when visible. The superficial great saphenous vein was also interrogated. Spectral Doppler was utilized to evaluate flow at rest and with distal  augmentation maneuvers in the common femoral, femoral and popliteal veins. COMPARISON:  None. FINDINGS: Contralateral Common Femoral Vein: Respiratory phasicity is normal and symmetric with the symptomatic side. No evidence of thrombus. Normal compressibility. Common Femoral Vein: No evidence of thrombus. Normal compressibility, respiratory phasicity and response to augmentation. Saphenofemoral Junction: No evidence of thrombus. Normal compressibility and flow on color Doppler imaging. Profunda Femoral Vein: No evidence of thrombus. Normal compressibility and flow on color Doppler imaging. Femoral Vein: No evidence of thrombus. Normal compressibility, respiratory phasicity and response to augmentation. Popliteal Vein: No evidence of thrombus. Normal compressibility, respiratory phasicity and response to augmentation. Calf Veins: No evidence of thrombus. Normal compressibility and flow on color Doppler imaging. Superficial Great Saphenous Vein: No evidence of thrombus. Normal compressibility. Other Findings:  None. IMPRESSION: No evidence of deep venous thrombosis. Electronically Signed   By: Jerilynn Mages.  Shick M.D.   On: 04/17/2019 14:06    Procedures Procedures (including critical care time)  Medications Ordered in ED Medications - No data to display   Initial Impression / Assessment and Plan / ED Course  I have reviewed the triage vital signs and the nursing notes.  Pertinent labs & imaging results that were available during my care of the patient were reviewed by me and considered in my medical decision making (see chart for details).  Patient seen and examined. Patient nontoxic appearing, in no apparent distress, vitals WNL, afebrile.  On exam she has erythema and edema of right lower leg.  She states this is chronic but is slightly worse today.  DP pulse 2+ bilaterally.  No open wounds or drainage.  Ultrasound is negative for DVT. Patient is prediabetic and and CBG is 144 today.  Pt is without gross  abscess for which I&D would be possible.  Area marked and pt encouraged to return if redness begins to streak, extends beyond the markings, and/or fever or nausea/vomiting develop.  Pt is alert, oriented,  NAD, afebrile, non tachycardic, nonseptic and nontoxic appearing.  Pt to be d/c on oral antibiotics with follow-up instructions for PCP.  Strict ED return precautions discussed with patient.  She is agreeable with plan of care.  The patient was discussed with and seen by Dr. Adriana Simas who agrees with the treatment plan.   Portions of this note were generated with Scientist, clinical (histocompatibility and immunogenetics). Dictation errors may occur despite best attempts at proofreading.    Final Clinical Impressions(s) / ED Diagnoses   Final diagnoses:  Cellulitis of right lower extremity    ED Discharge Orders         Ordered    clindamycin (CLEOCIN) 300 MG capsule  Every 6 hours     04/17/19 1432           Sherene Sires, [name] 04/17/19 1450    Donnetta Hutching, MD 04/18/19 [phone]

## 2019-04-17 NOTE — Discharge Instructions (Addendum)
You have been seen today for right leg pain. Please read and follow all provided instructions. Return to the emergency room for worsening condition or new concerning symptoms.    The ultrasound of your leg today does not show signs of blood clot.  1. Medications:  Prescription has been sent to your pharmacy for Clindamycin. This is an antibiotic used to treat cellulitis. Please take asa prescribed. Continue usual home medications  Take medications as prescribed. Please review all of the medicines and only take them if you do not have an allergy to them.   2. Treatment: rest, drink plenty of fluids. Watch the areas on your leg for worsening redness.  The area has been circled today to help you monitor it.  If the redness spreads outside of the marked area please return to the emergency department immediately as that is signs of worsening infection.  3. Follow Up: Please follow up with your primary doctor in 2-5 days for discussion of your diagnoses and further evaluation after today's visit; Call today to arrange your follow up.  -Also recommend you follow-up with a dermatologist for the wounds on your feet.  I have provided a Futures trader of dermatologist in the [place] area.    [facility] Dermatologists:  [facility]  [place] [id number]  [phone]   Dr. Michelene Gardener, MD  [place]  [phone]  [facility]  [place]  [phone]   [place]  [phone]  Lavonna Monarch MD  [place]  [phone]  Katrina Stack  [place]  [phone]  [place] Amy Y MD  [address]  [phone]  [place]  [address]  [phone]  ?

## 2019-04-26 ENCOUNTER — Other Ambulatory Visit: Payer: Self-pay

## 2019-04-26 DIAGNOSIS — Z20822 Contact with and (suspected) exposure to covid-19: Secondary | ICD-10-CM

## 2019-04-27 LAB — NOVEL CORONAVIRUS, NAA: SARS-CoV-2, NAA: NOT DETECTED

## 2019-05-09 ENCOUNTER — Other Ambulatory Visit: Payer: Self-pay | Admitting: *Deleted

## 2019-05-09 DIAGNOSIS — Z20822 Contact with and (suspected) exposure to covid-19: Secondary | ICD-10-CM

## 2019-05-11 LAB — NOVEL CORONAVIRUS, NAA: SARS-CoV-2, NAA: NOT DETECTED

## 2019-05-23 ENCOUNTER — Other Ambulatory Visit: Payer: Self-pay

## 2019-05-23 DIAGNOSIS — Z20822 Contact with and (suspected) exposure to covid-19: Secondary | ICD-10-CM

## 2019-05-25 LAB — NOVEL CORONAVIRUS, NAA: SARS-CoV-2, NAA: NOT DETECTED

## 2019-05-31 ENCOUNTER — Other Ambulatory Visit: Payer: Self-pay

## 2019-05-31 DIAGNOSIS — Z20822 Contact with and (suspected) exposure to covid-19: Secondary | ICD-10-CM

## 2019-06-02 LAB — NOVEL CORONAVIRUS, NAA: SARS-CoV-2, NAA: NOT DETECTED

## 2019-06-12 ENCOUNTER — Ambulatory Visit: Payer: 59 | Attending: Internal Medicine

## 2019-06-12 ENCOUNTER — Other Ambulatory Visit: Payer: Self-pay

## 2019-06-12 ENCOUNTER — Other Ambulatory Visit: Payer: 59

## 2019-06-12 DIAGNOSIS — Z20822 Contact with and (suspected) exposure to covid-19: Secondary | ICD-10-CM

## 2019-06-13 LAB — NOVEL CORONAVIRUS, NAA: SARS-CoV-2, NAA: NOT DETECTED

## 2019-06-14 ENCOUNTER — Other Ambulatory Visit: Payer: Self-pay

## 2019-06-14 ENCOUNTER — Ambulatory Visit
Admission: EM | Admit: 2019-06-14 | Discharge: 2019-06-14 | Disposition: A | Discharge status: Home / Self Care | Payer: 59 | Attending: Urgent Care | Admitting: Urgent Care

## 2019-06-14 DIAGNOSIS — L03115 Cellulitis of right lower limb: Secondary | ICD-10-CM

## 2019-06-14 DIAGNOSIS — M79661 Pain in right lower leg: Secondary | ICD-10-CM

## 2019-06-14 DIAGNOSIS — M7989 Other specified soft tissue disorders: Secondary | ICD-10-CM

## 2019-06-14 HISTORY — DX: Prediabetes: R73.03

## 2019-06-14 MED ORDER — DOXYCYCLINE HYCLATE 100 MG PO CAPS: 100.0000 mg | ORAL_CAPSULE | Freq: Two times a day (BID) | ORAL | 0 refills | Status: DC

## 2019-06-14 MED ORDER — NAPROXEN 500 MG PO TABS: 500.0000 mg | ORAL_TABLET | Freq: Two times a day (BID) | ORAL | 0 refills | Status: DC

## 2019-06-14 MED ORDER — FLUCONAZOLE 150 MG PO TABS: 150.0000 mg | ORAL_TABLET | ORAL | 0 refills | Status: DC

## 2019-06-14 NOTE — ED Triage Notes (Signed)
Pt presents to UC w/ c/o right leg swelling and redness. Pt states she frequently has issues w/ cellulitis but this morning it "very badly". Pt has open wounds on foot and some are weeping.

## 2019-06-14 NOTE — ED Provider Notes (Signed)
[place]     MRN: [id number] DOB: Jun 05, 1971  Subjective:   Carrie Mitchell is a 48 y.o. female presenting for 2 to 3-day history of recurrent right lower leg pain, swelling and redness.  Patient has had significant bouts of cellulitis requiring multiple rounds of antibiotics and an admission to the hospital.  Her last course of antibiotics was in October, states that she did doxycycline and clindamycin followed by one more final round of clindamycin.  She tried to contact her PCP but was unable to get in with them until January.  Patient states that she has been worked up for including biopsies of her skin that only showed dry skin.  She is also had negative ultrasounds for ruling out DVT.  Denies taking any chronic medications.   No Known Allergies   Past Medical History:  Diagnosis Date  . Cellulitis   . Cholelithiasis 03/18/2013  . Dermatitis 02/16/2012  . Prediabetes      Past Surgical History:  Procedure Laterality Date  . BREAST SURGERY    . CHOLECYSTECTOMY N/A 03/20/2013   Procedure: LAPAROSCOPIC CHOLECYSTECTOMY;  Surgeon: Donato Heinz, MD;  Location: [facility];  Service: General;  Laterality: N/A;  . TUBAL LIGATION      Family History  Problem Relation Age of Onset  . Diabetes Mother   . Hypertension Mother   . Stroke Mother   . Heart attack Father   . Crohn's disease Sister   . Hypertension Brother   . Hypertension Brother     Social History   Tobacco Use  . Smoking status: Former Smoker    Types: Cigarettes  . Smokeless tobacco: Never Used  Substance Use Topics  . Alcohol use: No  . Drug use: No    Review of Systems  Constitutional: Negative for fever and malaise/fatigue.  HENT: Negative for congestion, ear pain, sinus pain and sore throat.   Eyes: Negative for discharge and redness.  Respiratory: Negative for cough, hemoptysis, shortness of breath and wheezing.   Cardiovascular: Negative for chest pain.  Gastrointestinal:  Negative for abdominal pain, diarrhea, nausea and vomiting.  Genitourinary: Negative for dysuria, flank pain and hematuria.  Musculoskeletal: Positive for myalgias.  Skin: Positive for rash.  Neurological: Negative for dizziness, weakness and headaches.  Psychiatric/Behavioral: Negative for depression and substance abuse.   Denies fever, calf pain, nausea, vomiting, belly pain, chest pain.  Objective:   Vitals: BP 117/77 (BP Location: Right Arm)   Pulse 86   Temp 99 F (37.2 C) (Oral)   Resp 17   LMP 07/03/2013   SpO2 96%   Physical Exam Constitutional:      General: She is not in acute distress.    Appearance: Normal appearance. She is well-developed. She is obese. She is not ill-appearing, toxic-appearing or diaphoretic.  HENT:     Head: Normocephalic and atraumatic.     Nose: Nose normal.     Mouth/Throat:     Mouth: Mucous membranes are moist.     Pharynx: Oropharynx is clear.  Eyes:     General: No scleral icterus.    Extraocular Movements: Extraocular movements intact.     Pupils: Pupils are equal, round, and reactive to light.  Cardiovascular:     Rate and Rhythm: Normal rate.  Pulmonary:     Effort: Pulmonary effort is normal.  Musculoskeletal:        General: Swelling and tenderness present.     Right lower leg: Edema present.  Skin:  General: Skin is warm and dry.     Findings: Rash (1+ edema, erythema of right lower leg as depicted with associated tenderness; there are breaks in the skin along her foot laterally and medially) present.     Comments: No appreciable warmth over her right lower leg.  Neurological:     General: No focal deficit present.     Mental Status: She is alert and oriented to person, place, and time.     Motor: No weakness.     Coordination: Coordination abnormal (Due to body habitus).     Deep Tendon Reflexes: Reflexes normal.  Psychiatric:        Mood and Affect: Mood normal.        Behavior: Behavior normal.        Thought  Content: Thought content normal.        Judgment: Judgment normal.              Assessment and Plan :   1. Cellulitis of leg, right   2. Pain and swelling of right lower leg     Will cover for recurrent cellulitis of her right lower leg with doxycycline only.  Discussed with patient that frequent antibiotic use comes with the risk of C. difficile infection.  In effort to avoid this we will use only doxycycline and have her follow-up closely with her PCP.  She can use naproxen for pain and inflammation.  Diflucan will be used for antibiotic associated yeast infection. Counseled patient on potential for adverse effects with medications prescribed/recommended today, ER and return-to-clinic precautions discussed, patient verbalized understanding.    Wallis Bamberg, [name] 06/14/19 1729

## 2019-06-26 ENCOUNTER — Other Ambulatory Visit: Payer: Self-pay

## 2019-06-26 ENCOUNTER — Ambulatory Visit: Payer: 59 | Attending: Internal Medicine

## 2019-06-26 DIAGNOSIS — Z20822 Contact with and (suspected) exposure to covid-19: Secondary | ICD-10-CM

## 2019-06-27 LAB — NOVEL CORONAVIRUS, NAA: SARS-CoV-2, NAA: NOT DETECTED

## 2019-07-10 ENCOUNTER — Ambulatory Visit: Payer: 59 | Attending: Internal Medicine

## 2019-07-10 ENCOUNTER — Other Ambulatory Visit: Payer: Self-pay

## 2019-07-10 DIAGNOSIS — Z20822 Contact with and (suspected) exposure to covid-19: Secondary | ICD-10-CM

## 2019-07-11 LAB — NOVEL CORONAVIRUS, NAA: SARS-CoV-2, NAA: NOT DETECTED

## 2019-07-24 ENCOUNTER — Other Ambulatory Visit: Payer: 59

## 2019-07-26 ENCOUNTER — Ambulatory Visit: Payer: 59 | Attending: Internal Medicine

## 2019-07-26 ENCOUNTER — Other Ambulatory Visit: Payer: Self-pay

## 2019-07-26 DIAGNOSIS — Z20822 Contact with and (suspected) exposure to covid-19: Secondary | ICD-10-CM

## 2019-07-27 LAB — NOVEL CORONAVIRUS, NAA: SARS-CoV-2, NAA: NOT DETECTED

## 2019-08-08 ENCOUNTER — Ambulatory Visit: Payer: 59 | Attending: Internal Medicine

## 2019-08-08 ENCOUNTER — Other Ambulatory Visit: Payer: Self-pay

## 2019-08-08 DIAGNOSIS — Z20822 Contact with and (suspected) exposure to covid-19: Secondary | ICD-10-CM

## 2019-08-09 LAB — NOVEL CORONAVIRUS, NAA: SARS-CoV-2, NAA: NOT DETECTED

## 2019-08-23 ENCOUNTER — Other Ambulatory Visit: Payer: Self-pay

## 2019-08-23 ENCOUNTER — Ambulatory Visit: Payer: 59 | Attending: Internal Medicine

## 2019-08-23 DIAGNOSIS — Z20822 Contact with and (suspected) exposure to covid-19: Secondary | ICD-10-CM

## 2019-08-24 LAB — NOVEL CORONAVIRUS, NAA: SARS-CoV-2, NAA: NOT DETECTED

## 2019-09-06 ENCOUNTER — Other Ambulatory Visit: Payer: Self-pay

## 2019-09-06 ENCOUNTER — Ambulatory Visit: Payer: 59 | Attending: Internal Medicine

## 2019-09-06 DIAGNOSIS — Z20822 Contact with and (suspected) exposure to covid-19: Secondary | ICD-10-CM

## 2019-09-07 LAB — NOVEL CORONAVIRUS, NAA: SARS-CoV-2, NAA: NOT DETECTED

## 2019-09-20 ENCOUNTER — Other Ambulatory Visit: Payer: Self-pay

## 2019-09-20 ENCOUNTER — Ambulatory Visit: Payer: 59 | Attending: Internal Medicine

## 2019-09-20 DIAGNOSIS — Z20822 Contact with and (suspected) exposure to covid-19: Secondary | ICD-10-CM

## 2019-09-21 LAB — NOVEL CORONAVIRUS, NAA: SARS-CoV-2, NAA: NOT DETECTED

## 2019-10-09 ENCOUNTER — Other Ambulatory Visit: Payer: 59

## 2019-10-10 ENCOUNTER — Ambulatory Visit: Payer: 59 | Attending: Internal Medicine

## 2019-10-10 ENCOUNTER — Other Ambulatory Visit: Payer: Self-pay

## 2019-10-10 DIAGNOSIS — Z20822 Contact with and (suspected) exposure to covid-19: Secondary | ICD-10-CM

## 2019-10-11 LAB — NOVEL CORONAVIRUS, NAA: SARS-CoV-2, NAA: NOT DETECTED

## 2019-10-11 LAB — SARS-COV-2, NAA 2 DAY TAT

## 2019-10-23 ENCOUNTER — Other Ambulatory Visit: Payer: Self-pay

## 2019-10-23 ENCOUNTER — Ambulatory Visit: Payer: 59 | Attending: Internal Medicine

## 2019-10-23 DIAGNOSIS — Z20822 Contact with and (suspected) exposure to covid-19: Secondary | ICD-10-CM

## 2019-10-24 LAB — SARS-COV-2, NAA 2 DAY TAT

## 2019-10-24 LAB — NOVEL CORONAVIRUS, NAA: SARS-CoV-2, NAA: NOT DETECTED

## 2019-11-28 ENCOUNTER — Ambulatory Visit: Payer: 59 | Attending: Internal Medicine

## 2019-11-28 ENCOUNTER — Other Ambulatory Visit: Payer: Self-pay

## 2019-11-28 DIAGNOSIS — Z20822 Contact with and (suspected) exposure to covid-19: Secondary | ICD-10-CM

## 2019-11-28 NOTE — Progress Notes (Signed)
n

## 2019-11-29 LAB — NOVEL CORONAVIRUS, NAA: SARS-CoV-2, NAA: NOT DETECTED

## 2019-11-29 LAB — SARS-COV-2, NAA 2 DAY TAT

## 2019-12-07 ENCOUNTER — Ambulatory Visit: Payer: 59 | Attending: Internal Medicine

## 2019-12-07 DIAGNOSIS — Z20822 Contact with and (suspected) exposure to covid-19: Secondary | ICD-10-CM

## 2019-12-08 LAB — NOVEL CORONAVIRUS, NAA: SARS-CoV-2, NAA: NOT DETECTED

## 2019-12-08 LAB — SARS-COV-2, NAA 2 DAY TAT

## 2020-02-22 ENCOUNTER — Other Ambulatory Visit: Payer: 59

## 2020-02-22 ENCOUNTER — Other Ambulatory Visit: Payer: Self-pay | Admitting: Critical Care Medicine

## 2020-02-22 DIAGNOSIS — Z20822 Contact with and (suspected) exposure to covid-19: Secondary | ICD-10-CM

## 2020-02-24 LAB — NOVEL CORONAVIRUS, NAA: SARS-CoV-2, NAA: NOT DETECTED

## 2020-04-13 ENCOUNTER — Other Ambulatory Visit: Payer: Self-pay | Admitting: Internal Medicine

## 2020-04-13 ENCOUNTER — Other Ambulatory Visit: Payer: 59

## 2020-04-13 DIAGNOSIS — Z20822 Contact with and (suspected) exposure to covid-19: Secondary | ICD-10-CM

## 2020-04-14 LAB — NOVEL CORONAVIRUS, NAA: SARS-CoV-2, NAA: NOT DETECTED

## 2020-04-14 LAB — SARS-COV-2, NAA 2 DAY TAT

## 2020-06-27 ENCOUNTER — Ambulatory Visit
Admission: EM | Admit: 2020-06-27 | Discharge: 2020-06-27 | Disposition: A | Discharge status: Home / Self Care | Payer: 59 | Attending: Emergency Medicine | Admitting: Emergency Medicine

## 2020-06-27 ENCOUNTER — Other Ambulatory Visit: Payer: Self-pay

## 2020-06-27 ENCOUNTER — Encounter: Payer: Self-pay | Admitting: Emergency Medicine

## 2020-06-27 DIAGNOSIS — L03115 Cellulitis of right lower limb: Secondary | ICD-10-CM | POA: Diagnosis not present

## 2020-06-27 MED ORDER — DOXYCYCLINE HYCLATE 100 MG PO CAPS: 100.0000 mg | ORAL_CAPSULE | Freq: Two times a day (BID) | ORAL | 0 refills | Status: DC

## 2020-06-27 NOTE — ED Triage Notes (Signed)
Pt c/o of pain, redness and swelling to right lower leg x 4 days.

## 2020-06-27 NOTE — ED Provider Notes (Signed)
[facility]   [id number] 06/27/20 Arrival Time: 0808   Chief Complaint  Patient presents with  . Leg Pain     SUBJECTIVE: History from: patient.  Carrie Mitchell is a 50 y.o. female with history of recurrent cellulitis presented to the urgent care for complaint of right lower leg pain, redness and swelling  for the past 4 days.  Denies any precipitating event.  Localized redness, pain and swelling to the right lower extremities.  She describes it as painful, red and spreading.  Has not tried any OTC medication.  Her symptoms are made worse with ROM.  She reports similar symptoms in the past that improved with antibiotic treatment.  Denies chills, fever, nausea, vomiting, diarrhea.   ROS: As per HPI.  All other pertinent ROS negative.      Past Medical History:  Diagnosis Date  . Cellulitis   . Cholelithiasis 03/18/2013  . Dermatitis 02/16/2012  . Prediabetes    Past Surgical History:  Procedure Laterality Date  . BREAST SURGERY    . CHOLECYSTECTOMY N/A 03/20/2013   Procedure: LAPAROSCOPIC CHOLECYSTECTOMY;  Surgeon: Fabio Bering, MD;  Location: [facility];  Service: General;  Laterality: N/A;  . TUBAL LIGATION     Not on File No current facility-administered medications on file prior to encounter.   Current Outpatient Medications on File Prior to Encounter  Medication Sig Dispense Refill  . acetaminophen-codeine (TYLENOL #3) 300-30 MG per tablet Take 1 tablet by mouth every 8 (eight) hours as needed for moderate pain.    Marland Kitchen diclofenac (VOLTAREN) 75 MG EC tablet Take 1 tablet (75 mg total) by mouth 2 (two) times daily. 12 tablet 0  . diphenhydrAMINE (BENADRYL) 25 mg capsule Take 1 capsule (25 mg total) by mouth every 8 (eight) hours as needed for itching. 30 capsule 0  . fluconazole (DIFLUCAN) 150 MG tablet Take 1 tablet (150 mg total) by mouth once a week. Repeat if needed 2 tablet 0  . HYDROcodone-acetaminophen (NORCO/VICODIN) 5-325 MG tablet Take 1 tablet by mouth  every 4 (four) hours as needed. 10 tablet 0  . ibuprofen (ADVIL,MOTRIN) 800 MG tablet Take 800 mg by mouth every 8 (eight) hours as needed for moderate pain.    . naproxen (NAPROSYN) 500 MG tablet Take 1 tablet (500 mg total) by mouth 2 (two) times daily. 30 tablet 0   Social History   Socioeconomic History  . Marital status: Married    Spouse name: Not on file  . Number of children: Not on file  . Years of education: Not on file  . Highest education level: Not on file  Occupational History  . Not on file  Tobacco Use  . Smoking status: Former Smoker    Types: Cigarettes  . Smokeless tobacco: Never Used  Vaping Use  . Vaping Use: Never used  Substance and Sexual Activity  . Alcohol use: No  . Drug use: No  . Sexual activity: Yes    Birth control/protection: Surgical  Other Topics Concern  . Not on file  Social History Narrative  . Not on file   Social Determinants of Health   Financial Resource Strain: Not on file  Food Insecurity: Not on file  Transportation Needs: Not on file  Physical Activity: Not on file  Stress: Not on file  Social Connections: Not on file  Intimate Partner Violence: Not on file   Family History  Problem Relation Age of Onset  . Diabetes Mother   . Hypertension Mother   .  Stroke Mother   . Heart attack Father   . Crohn's disease Sister   . Hypertension Brother   . Hypertension Brother     OBJECTIVE:  Vitals:   06/27/20 0824 06/27/20 0827  BP:  101/61  Pulse:  84  Resp:  20  Temp:  98.3 F (36.8 C)  TempSrc:  Oral  SpO2:  98%  Weight: (!) 347 lb (157.4 kg)      Physical Exam Vitals and nursing note reviewed.  Constitutional:      General: She is not in acute distress.    Appearance: Normal appearance. She is normal weight. She is not ill-appearing, toxic-appearing or diaphoretic.  HENT:     Head: Normocephalic.  Cardiovascular:     Rate and Rhythm: Normal rate and regular rhythm.     Pulses: Normal pulses.     Heart  sounds: Normal heart sounds. No murmur heard. No friction rub. No gallop.   Pulmonary:     Effort: Pulmonary effort is normal. No respiratory distress.     Breath sounds: Normal breath sounds. No stridor. No wheezing, rhonchi or rales.  Chest:     Chest wall: No tenderness.  Musculoskeletal:     Comments: Pain in right calf muscle on massage  Skin:    General: Skin is warm.     Findings: Erythema present.     Nails: There is no clubbing.  Neurological:     Mental Status: She is alert and oriented to person, place, and time.     LABS:  No results found for this or any previous visit (from the past 24 hour(s)).   ASSESSMENT & PLAN:  1. Cellulitis of right lower leg     Meds ordered this encounter  Medications  . doxycycline (VIBRAMYCIN) 100 MG capsule    Sig: Take 1 capsule (100 mg total) by mouth 2 (two) times daily.    Dispense:  20 capsule    Refill:  0   Patient is stable at discharge.  Symptom is likely a cellulitis with a differential diagnosis of DVT as there is pain in the calf muscle.  She was prescribed doxycycline and she was advised to go to ER if symptom does not resolve   Discharge Instructions  Doxycycline was prescribed/take as directed Follow-up with PCP Return or go to ED if you develop any new or worsening of symptoms    Reviewed expectations re: course of current medical issues. Questions answered. Outlined signs and symptoms indicating need for more acute intervention. Patient verbalized understanding. After Visit Summary given.           Durward Parcel, FNP 06/27/20 [phone]

## 2020-06-27 NOTE — Discharge Instructions (Signed)
Doxycycline was prescribed/take as directed Follow-up with PCP Return or go to ED if you develop any new or worsening of symptoms

## 2021-05-20 ENCOUNTER — Encounter (HOSPITAL_COMMUNITY): Payer: Self-pay | Admitting: *Deleted

## 2021-05-20 ENCOUNTER — Emergency Department (HOSPITAL_COMMUNITY): Payer: BC Managed Care – PPO

## 2021-05-20 ENCOUNTER — Emergency Department (HOSPITAL_COMMUNITY)
Admission: EM | Admit: 2021-05-20 | Discharge: 2021-05-20 | Disposition: A | Discharge status: Home / Self Care | Payer: BC Managed Care – PPO | Attending: Emergency Medicine | Admitting: Emergency Medicine

## 2021-05-20 ENCOUNTER — Other Ambulatory Visit: Payer: Self-pay

## 2021-05-20 DIAGNOSIS — R509 Fever, unspecified: Secondary | ICD-10-CM | POA: Diagnosis not present

## 2021-05-20 DIAGNOSIS — R079 Chest pain, unspecified: Secondary | ICD-10-CM | POA: Diagnosis not present

## 2021-05-20 DIAGNOSIS — Z87891 Personal history of nicotine dependence: Secondary | ICD-10-CM | POA: Diagnosis not present

## 2021-05-20 DIAGNOSIS — R059 Cough, unspecified: Secondary | ICD-10-CM | POA: Diagnosis not present

## 2021-05-20 DIAGNOSIS — R0602 Shortness of breath: Secondary | ICD-10-CM | POA: Diagnosis not present

## 2021-05-20 DIAGNOSIS — J101 Influenza due to other identified influenza virus with other respiratory manifestations: Secondary | ICD-10-CM | POA: Insufficient documentation

## 2021-05-20 DIAGNOSIS — Z20822 Contact with and (suspected) exposure to covid-19: Secondary | ICD-10-CM | POA: Insufficient documentation

## 2021-05-20 LAB — RESP PANEL BY RT-PCR (FLU A&B, COVID) ARPGX2
Influenza A by PCR: POSITIVE — AB
Influenza B by PCR: NEGATIVE
SARS Coronavirus 2 by RT PCR: NEGATIVE

## 2021-05-20 MED ORDER — BENZONATATE 100 MG PO CAPS: 100.0000 mg | ORAL_CAPSULE | Freq: Three times a day (TID) | ORAL | 0 refills | Status: DC | PRN

## 2021-05-20 MED ORDER — ALBUTEROL SULFATE HFA 108 (90 BASE) MCG/ACT IN AERS
2.0000 | INHALATION_SPRAY | Freq: Once | RESPIRATORY_TRACT | Status: AC
  Administered 2021-05-20: 2 via RESPIRATORY_TRACT

## 2021-05-20 NOTE — Discharge Instructions (Signed)
Drink plenty of fluids.  Take Tylenol Motrin for aches and pains.

## 2021-05-20 NOTE — ED Triage Notes (Signed)
Pt c/o fever with cough and body aches x 3 days; pt states her daughter tested positive for covid  but she has taken  tests at home and they were negative

## 2021-05-20 NOTE — ED Provider Notes (Signed)
[facility] EMERGENCY DEPARTMENT Provider Note   CSN: [id number] Arrival date & time: 05/20/21  7106     History Chief Complaint  Patient presents with   Fever    Carrie Mitchell is a 50 y.o. female.  Patient complains of cough aches.  Patient states the symptoms started on Friday  The history is provided by the patient and medical records. No language interpreter was used.  Fever Temp source:  Subjective Severity:  Moderate Onset quality:  Sudden Duration:  4 days Timing:  Constant Chronicity:  New Relieved by:  Nothing Worsened by:  Nothing Associated symptoms: cough   Associated symptoms: no chest pain, no congestion, no diarrhea, no headaches and no rash       Past Medical History:  Diagnosis Date   Cellulitis    Cholelithiasis 03/18/2013   Dermatitis 02/16/2012   Prediabetes     Patient Active Problem List   Diagnosis Date Noted   Cellulitis 01/30/2014   Urinary incontinence, urge 07/20/2013   Acute renal insufficiency 03/18/2013   Cholelithiasis 03/18/2013   Abdominal pain, right upper quadrant 03/18/2013   Cellulitis of leg, right 03/18/2013   Chest pain 03/17/2013   Nausea & vomiting 03/17/2013   Dermatitis 02/16/2012   Hypokalemia 02/14/2012   Obesity 11/09/2011   DEGENERATIVE JOINT DISEASE, RIGHT KNEE 04/25/2009    Past Surgical History:  Procedure Laterality Date   BREAST SURGERY     CHOLECYSTECTOMY N/A 03/20/2013   Procedure: LAPAROSCOPIC CHOLECYSTECTOMY;  Surgeon: Fabio Bering, MD;  Location: [facility];  Service: General;  Laterality: N/A;   TUBAL LIGATION       OB History   No obstetric history on file.     Family History  Problem Relation Age of Onset   Diabetes Mother    Hypertension Mother    Stroke Mother    Heart attack Father    Crohn's disease Sister    Hypertension Brother    Hypertension Brother     Social History   Tobacco Use   Smoking status: Former    Types: Cigarettes   Smokeless tobacco: Never  Vaping  Use   Vaping Use: Never used  Substance Use Topics   Alcohol use: No   Drug use: No    Home Medications Prior to Admission medications   Medication Sig Start Date End Date Taking? Authorizing Provider  benzonatate (TESSALON) 100 MG capsule Take 1 capsule (100 mg total) by mouth 3 (three) times daily as needed for cough. 05/20/21  Yes Bethann Berkshire, MD  acetaminophen-codeine (TYLENOL #3) 300-30 MG per tablet Take 1 tablet by mouth every 8 (eight) hours as needed for moderate pain.    [provider]  diclofenac (VOLTAREN) 75 MG EC tablet Take 1 tablet (75 mg total) by mouth 2 (two) times daily. 01/29/18   Ivery Quale, [name]  diphenhydrAMINE (BENADRYL) 25 mg capsule Take 1 capsule (25 mg total) by mouth every 8 (eight) hours as needed for itching. 02/01/14   Black, Lesle Chris, NP  doxycycline (VIBRAMYCIN) 100 MG capsule Take 1 capsule (100 mg total) by mouth 2 (two) times daily. 06/27/20   Avegno, Zachery Dakins, FNP  fluconazole (DIFLUCAN) 150 MG tablet Take 1 tablet (150 mg total) by mouth once a week. Repeat if needed 06/14/19   Wallis Bamberg, [name]  HYDROcodone-acetaminophen (NORCO/VICODIN) 5-325 MG tablet Take 1 tablet by mouth every 4 (four) hours as needed. 01/29/18   Ivery Quale, [name]  ibuprofen (ADVIL,MOTRIN) 800 MG tablet Take 800 mg by  mouth every 8 (eight) hours as needed for moderate pain.    [provider]  naproxen (NAPROSYN) 500 MG tablet Take 1 tablet (500 mg total) by mouth 2 (two) times daily. 06/14/19   Wallis BambergMani, Mario, [name]    Allergies    Patient has no known allergies.  Review of Systems   Review of Systems  Constitutional:  Positive for fever. Negative for appetite change and fatigue.  HENT:  Negative for congestion, ear discharge and sinus pressure.   Eyes:  Negative for discharge.  Respiratory:  Positive for cough.   Cardiovascular:  Negative for chest pain.  Gastrointestinal:  Negative for abdominal pain and diarrhea.  Genitourinary:  Negative for  frequency and hematuria.  Musculoskeletal:  Negative for back pain.  Skin:  Negative for rash.  Neurological:  Negative for seizures and headaches.  Psychiatric/Behavioral:  Negative for hallucinations.    Physical Exam Updated Vital Signs BP 127/88 (BP Location: Right Arm)   Pulse 94   Temp 99.7 F (37.6 C) (Oral)   Resp 20   Ht 5\' 10"  (1.778 m)   Wt (!) 154.2 kg   LMP 07/03/2013   SpO2 95%   BMI 48.78 kg/m   Physical Exam Vitals and nursing note reviewed.  Constitutional:      Appearance: She is well-developed.  HENT:     Head: Normocephalic.     Nose: Nose normal.  Eyes:     General: No scleral icterus.    Conjunctiva/sclera: Conjunctivae normal.  Neck:     Thyroid: No thyromegaly.  Cardiovascular:     Rate and Rhythm: Normal rate and regular rhythm.     Heart sounds: No murmur heard.   No friction rub. No gallop.  Pulmonary:     Breath sounds: No stridor. No wheezing or rales.  Chest:     Chest wall: No tenderness.  Abdominal:     General: There is no distension.     Tenderness: There is no abdominal tenderness. There is no rebound.  Musculoskeletal:        General: Normal range of motion.     Cervical back: Neck supple.  Lymphadenopathy:     Cervical: No cervical adenopathy.  Skin:    Findings: No erythema or rash.  Neurological:     Mental Status: She is alert and oriented to person, place, and time.     Motor: No abnormal muscle tone.     Coordination: Coordination normal.  Psychiatric:        Behavior: Behavior normal.    ED Results / Procedures / Treatments   Labs (all labs ordered are listed, but only abnormal results are displayed) Labs Reviewed  RESP PANEL BY RT-PCR (FLU A&B, COVID) ARPGX2 - Abnormal; Notable for the following components:      Result Value   Influenza A by PCR POSITIVE (*)    All other components within normal limits    EKG None  Radiology DG Chest Port 1 View  Result Date: 05/20/2021 CLINICAL DATA:  Shortness of  breath and chest pain. Cough and fever. EXAM: PORTABLE CHEST 1 VIEW COMPARISON:  03/17/2013 FINDINGS: 0731 hours. The lungs are clear without focal pneumonia, edema, pneumothorax or pleural effusion. The cardiopericardial silhouette is within normal limits for size. The visualized bony structures of the thorax show no acute abnormality. Telemetry leads overlie the chest. IMPRESSION: No active disease. Electronically Signed   By: Kennith CenterEric  Mansell M.D.   On: 05/20/2021 07:52    Procedures Procedures  Medications Ordered in ED Medications  albuterol (VENTOLIN HFA) 108 (90 Base) MCG/ACT inhaler 2 puff (2 puffs Inhalation Given 05/20/21 0802)    ED Course  I have reviewed the triage vital signs and the nursing notes.  Pertinent labs & imaging results that were available during my care of the patient were reviewed by me and considered in my medical decision making (see chart for details).    MDM Rules/Calculators/A&P                           Patient with influenza.  Patient is given Best boy and told to take Tylenol and fluids Final Clinical Impression(s) / ED Diagnoses Final diagnoses:  Influenza A    Rx / DC Orders ED Discharge Orders          Ordered    benzonatate (TESSALON) 100 MG capsule  3 times daily PRN        05/20/21 0919             Milton Ferguson, MD 05/20/21 [phone]

## 2021-08-07 ENCOUNTER — Other Ambulatory Visit (HOSPITAL_COMMUNITY): Payer: Self-pay | Admitting: Family Medicine

## 2021-08-07 DIAGNOSIS — Z1231 Encounter for screening mammogram for malignant neoplasm of breast: Secondary | ICD-10-CM

## 2021-08-11 ENCOUNTER — Other Ambulatory Visit: Payer: Self-pay

## 2021-08-11 ENCOUNTER — Ambulatory Visit (HOSPITAL_COMMUNITY)
Admission: RE | Admit: 2021-08-11 | Discharge: 2021-08-11 | Disposition: A | Discharge status: Home / Self Care | Payer: BC Managed Care – PPO | Source: Ambulatory Visit | Attending: Family Medicine | Admitting: Family Medicine

## 2021-08-11 DIAGNOSIS — Z1231 Encounter for screening mammogram for malignant neoplasm of breast: Secondary | ICD-10-CM | POA: Insufficient documentation

## 2022-01-07 IMAGING — DX DG CHEST 1V PORT
1 series · 1 of 1 positions shown · non-contrast
Comparison: 03/17/2013

CLINICAL DATA: Shortness of breath and chest pain. Cough and fever.

EXAM:
PORTABLE CHEST 1 VIEW

[chest ap]
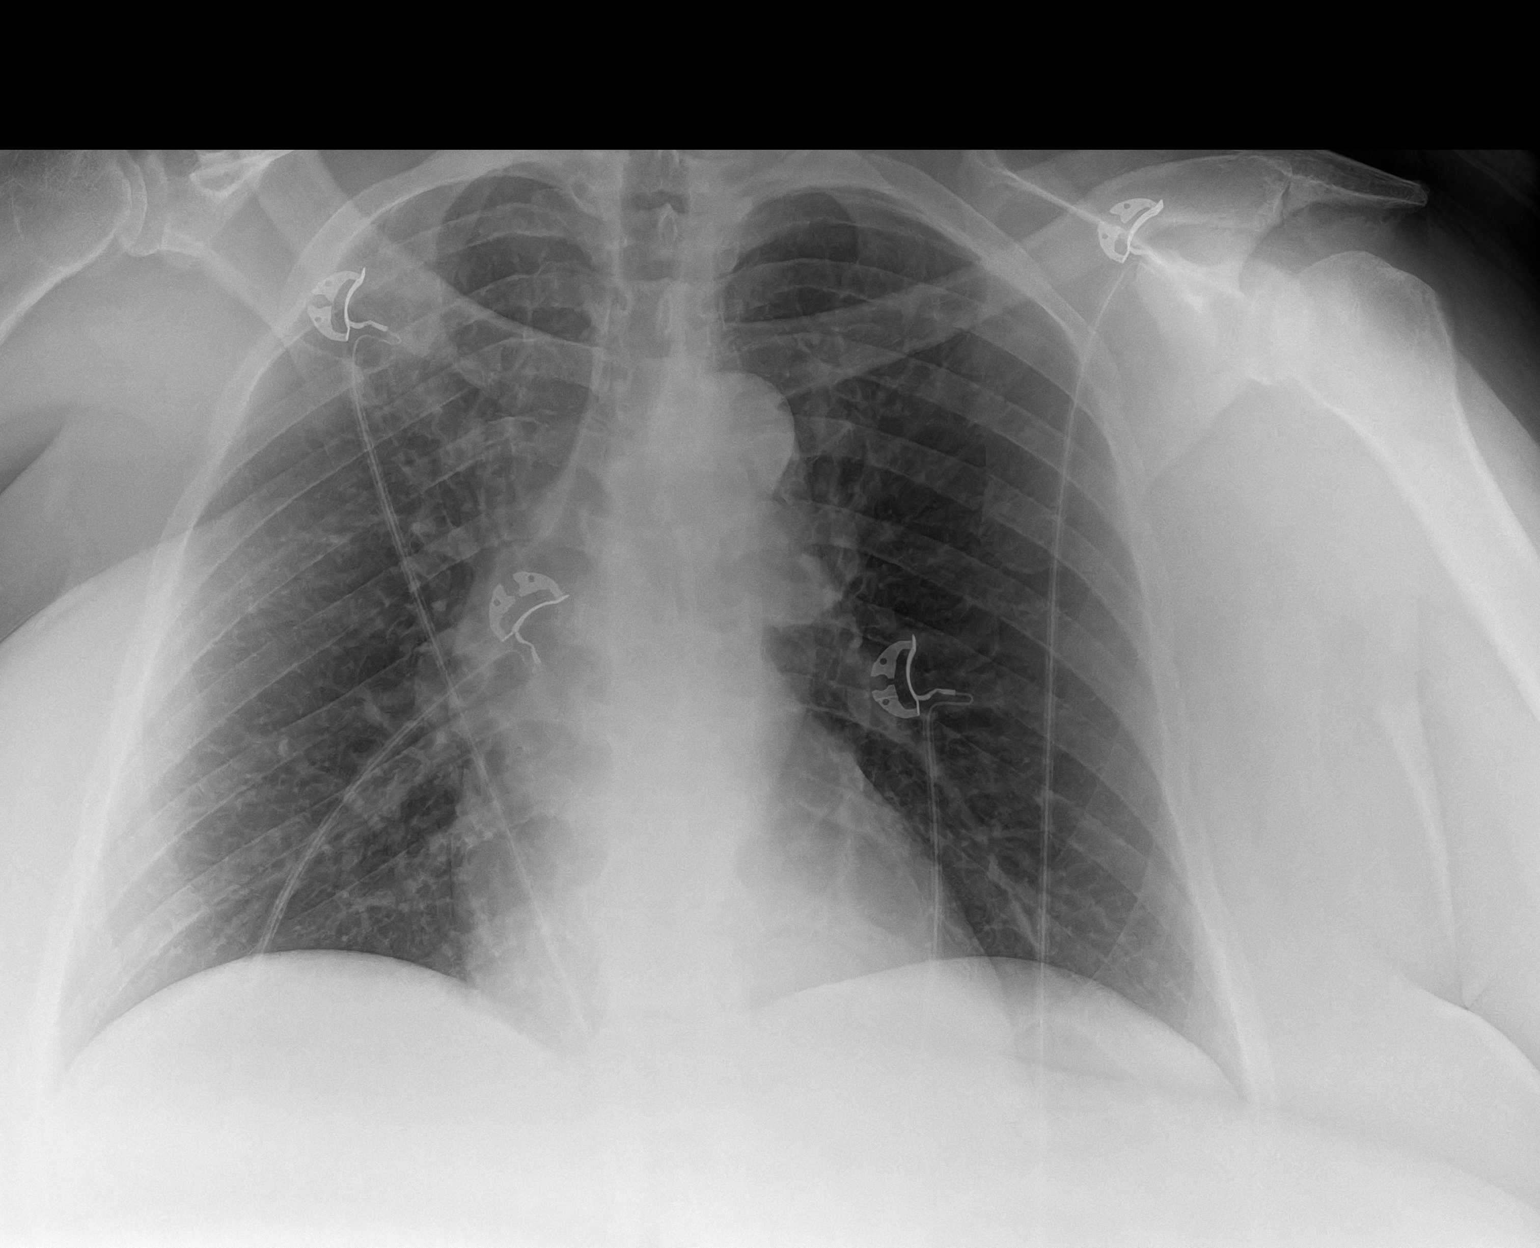

[1 of 1 positions shown; findings below may reference images not displayed]

FINDINGS: 7529 hours. The lungs are clear without focal pneumonia, edema,
pneumothorax or pleural effusion. The cardiopericardial silhouette
is within normal limits for size. The visualized bony structures of
the thorax show no acute abnormality. Telemetry leads overlie the
chest.
IMPRESSION: No active disease.

## 2022-05-29 ENCOUNTER — Ambulatory Visit: Payer: BC Managed Care – PPO | Admitting: Family Medicine

## 2022-05-29 ENCOUNTER — Encounter: Payer: Self-pay | Admitting: Family Medicine

## 2022-05-29 VITALS — BP 142/92 | HR 94 | Ht 70.0 in | Wt 363.0 lb

## 2022-05-29 DIAGNOSIS — Z1329 Encounter for screening for other suspected endocrine disorder: Secondary | ICD-10-CM

## 2022-05-29 DIAGNOSIS — Z1159 Encounter for screening for other viral diseases: Secondary | ICD-10-CM

## 2022-05-29 DIAGNOSIS — Z23 Encounter for immunization: Secondary | ICD-10-CM | POA: Diagnosis not present

## 2022-05-29 DIAGNOSIS — I10 Essential (primary) hypertension: Secondary | ICD-10-CM | POA: Diagnosis not present

## 2022-05-29 DIAGNOSIS — Z114 Encounter for screening for human immunodeficiency virus [HIV]: Secondary | ICD-10-CM

## 2022-05-29 DIAGNOSIS — R7301 Impaired fasting glucose: Secondary | ICD-10-CM

## 2022-05-29 DIAGNOSIS — Z1211 Encounter for screening for malignant neoplasm of colon: Secondary | ICD-10-CM

## 2022-05-29 DIAGNOSIS — E038 Other specified hypothyroidism: Secondary | ICD-10-CM

## 2022-05-29 DIAGNOSIS — L309 Dermatitis, unspecified: Secondary | ICD-10-CM | POA: Diagnosis not present

## 2022-05-29 DIAGNOSIS — E7849 Other hyperlipidemia: Secondary | ICD-10-CM

## 2022-05-29 DIAGNOSIS — E559 Vitamin D deficiency, unspecified: Secondary | ICD-10-CM

## 2022-05-29 DIAGNOSIS — R030 Elevated blood-pressure reading, without diagnosis of hypertension: Secondary | ICD-10-CM

## 2022-05-29 MED ORDER — CLOBETASOL PROPIONATE 0.05 % EX CREA
1.0000 | TOPICAL_CREAM | Freq: Two times a day (BID) | CUTANEOUS | 0 refills | Status: DC
Start: 2022-05-29 — End: 2022-10-15

## 2022-05-29 NOTE — Patient Instructions (Addendum)
I appreciate the opportunity to provide care to you today!    Follow up:  2 weeks pap smear and BP  Labs: please stop by the lab during the week  to get your blood drawn (CBC, CMP, TSH, Lipid profile, HgA1c, Vit D)    Please pick up your medication at the pharmacy    Please continue to a heart-healthy diet and increase your physical activities. Try to exercise for at least three times a week.      It was a pleasure to see you and I look forward to continuing to work together on your health and well-being. Please do not hesitate to call the office if you need care or have questions about your care.   Have a wonderful day and week. With Gratitude, Gilmore Laroche MSN, FNP-BC

## 2022-05-29 NOTE — Progress Notes (Unsigned)
New Patient Office Visit  Subjective:  Patient ID: Carrie Mitchell, female    DOB: 1971-04-23  Age: 51 y.o. MRN: [id number]  CC:  Chief Complaint  Patient presents with   Establish Care    New patient, previously seeing [facility]. Pt would like to discuss foot problem states it splits open on the heel of her feet and gets infected sometimes. Onset of this has been for many years. Due for pap needs visit to come back.     HPI Carrie Mitchell is a 51 y.o. female with past medical history of dermatitis, obesity, hypokalemia and chest pain presents for establishing care. For the details of today's visit, please refer to the assessment and plan.       Past Medical History:  Diagnosis Date   Cellulitis    Cholelithiasis 03/18/2013   Dermatitis 02/16/2012   Prediabetes     Past Surgical History:  Procedure Laterality Date   BREAST SURGERY     CHOLECYSTECTOMY N/A 03/20/2013   Procedure: LAPAROSCOPIC CHOLECYSTECTOMY;  Surgeon: Donato Heinz, MD;  Location: [facility];  Service: General;  Laterality: N/A;   TUBAL LIGATION      Family History  Problem Relation Age of Onset   Diabetes Mother    Hypertension Mother    Stroke Mother    Heart attack Father    Crohn's disease Sister    Hypertension Brother    Hypertension Brother     Social History   Socioeconomic History   Marital status: Married    Spouse name: Not on file   Number of children: Not on file   Years of education: Not on file   Highest education level: Not on file  Occupational History   Not on file  Tobacco Use   Smoking status: Former    Types: Cigarettes   Smokeless tobacco: Never  Vaping Use   Vaping Use: Never used  Substance and Sexual Activity   Alcohol use: No   Drug use: No   Sexual activity: Yes    Birth control/protection: Surgical  Other Topics Concern   Not on file  Social History Narrative   Not on file   Social Determinants of Health   Financial Resource Strain: Not on  file  Food Insecurity: Not on file  Transportation Needs: Not on file  Physical Activity: Not on file  Stress: Not on file  Social Connections: Not on file  Intimate Partner Violence: Not on file    ROS Review of Systems  Constitutional:  Negative for chills and fever.  Eyes:  Negative for visual disturbance.  Respiratory:  Negative for chest tightness and shortness of breath.   Neurological:  Negative for dizziness and headaches.    Objective:   Today's Vitals: BP (!) 142/92 (BP Location: Left Arm)   Pulse 94   Ht _0  (1.778 m)   Wt (!) 363 lb (164.7 kg)   LMP 06/29/2013   SpO2 95%   BMI 52.09 kg/m   Physical Exam HENT:     Head: Normocephalic.     Mouth/Throat:     Mouth: Mucous membranes are moist.  Cardiovascular:     Rate and Rhythm: Normal rate.     Heart sounds: Normal heart sounds.  Pulmonary:     Effort: Pulmonary effort is normal.     Breath sounds: Normal breath sounds.  Musculoskeletal:     Comments: Excessive dry skin on her left foot No signs of inflammation/ infection noted  Neurological:     Mental Status: She is alert.      Assessment & Plan:   Dermatitis Assessment & Plan: Crackling of the feet likely due to excessive dryness of the skin on the feet She reports seeing a podiatrist more than once concerning her complaints and and was told similar findings Will treat with topical corticosteroid to relieve symptoms of pruritus   Orders: -     Clobetasol Propionate; Apply 1 Application topically 2 (two) times daily.  Dispense: 30 g; Refill: 0  Elevated BP without diagnosis of hypertension Assessment & Plan: Uncontrolled in clinic today She reports increased life stresses Recommend a low-sodium diet with increased physical activities We will follow-up on BP in 2 weeks and if her BP is elevated then we will start the patient on antihypertensive BP Readings from Last 3 Encounters:  05/29/22 (!) 142/92  05/20/21 127/88  06/27/20  101/61      Immunization due -     Tdap vaccine greater than or equal to 7yo IM  Colon cancer screening -     Fecal occult blood, imunochemical  Primary hypertension  Vitamin D deficiency -     VITAMIN D 25 Hydroxy (Vit-D Deficiency, Fractures)  Thyroid disorder screening  IFG (impaired fasting glucose) -     Hemoglobin A1c  Need for hepatitis C screening test -     Hepatitis C antibody  Encounter for screening for HIV -     HIV Antibody (routine testing w rflx)  Other specified hypothyroidism -     TSH + free T4  Other hyperlipidemia -     Lipid panel -     CMP14+EGFR -     CBC with Differential/Platelet     Follow-up: Return in about 2 weeks (around 06/12/2022) for pap smear, BP.   Alvira Monday, FNP

## 2022-05-30 DIAGNOSIS — R030 Elevated blood-pressure reading, without diagnosis of hypertension: Secondary | ICD-10-CM | POA: Insufficient documentation

## 2022-05-30 NOTE — Assessment & Plan Note (Signed)
Uncontrolled in clinic today She reports increased life stresses Recommend a low-sodium diet with increased physical activities We will follow-up on BP in 2 weeks and if her BP is elevated then we will start the patient on antihypertensive BP Readings from Last 3 Encounters:  05/29/22 (!) 142/92  05/20/21 127/88  06/27/20 101/61

## 2022-05-30 NOTE — Assessment & Plan Note (Signed)
Crackling of the feet likely due to excessive dryness of the skin on the feet She reports seeing a podiatrist more than once concerning her complaints and and was told similar findings Will treat with topical corticosteroid to relieve symptoms of pruritus

## 2022-09-22 ENCOUNTER — Emergency Department (HOSPITAL_COMMUNITY): Payer: BC Managed Care – PPO

## 2022-09-22 ENCOUNTER — Emergency Department (HOSPITAL_COMMUNITY)
Admission: EM | Admit: 2022-09-22 | Discharge: 2022-09-22 | Disposition: A | Discharge status: Home / Self Care | Payer: BC Managed Care – PPO | Attending: Emergency Medicine | Admitting: Emergency Medicine

## 2022-09-22 ENCOUNTER — Other Ambulatory Visit: Payer: Self-pay

## 2022-09-22 DIAGNOSIS — R531 Weakness: Secondary | ICD-10-CM | POA: Diagnosis not present

## 2022-09-22 DIAGNOSIS — R109 Unspecified abdominal pain: Secondary | ICD-10-CM | POA: Diagnosis not present

## 2022-09-22 DIAGNOSIS — K8020 Calculus of gallbladder without cholecystitis without obstruction: Secondary | ICD-10-CM | POA: Diagnosis not present

## 2022-09-22 DIAGNOSIS — Z1152 Encounter for screening for COVID-19: Secondary | ICD-10-CM | POA: Diagnosis not present

## 2022-09-22 DIAGNOSIS — K529 Noninfective gastroenteritis and colitis, unspecified: Secondary | ICD-10-CM | POA: Insufficient documentation

## 2022-09-22 DIAGNOSIS — R9431 Abnormal electrocardiogram [ECG] [EKG]: Secondary | ICD-10-CM | POA: Diagnosis not present

## 2022-09-22 DIAGNOSIS — R197 Diarrhea, unspecified: Secondary | ICD-10-CM | POA: Diagnosis not present

## 2022-09-22 DIAGNOSIS — K838 Other specified diseases of biliary tract: Secondary | ICD-10-CM | POA: Diagnosis not present

## 2022-09-22 DIAGNOSIS — E876 Hypokalemia: Secondary | ICD-10-CM | POA: Diagnosis not present

## 2022-09-22 LAB — URINALYSIS, ROUTINE W REFLEX MICROSCOPIC
Bacteria, UA: NONE SEEN
Bilirubin Urine: NEGATIVE
Glucose, UA: NEGATIVE mg/dL
Hgb urine dipstick: NEGATIVE
Ketones, ur: NEGATIVE mg/dL
Leukocytes,Ua: NEGATIVE
Nitrite: NEGATIVE
Protein, ur: 30 mg/dL — AB
Specific Gravity, Urine: 1.02 (ref 1.005–1.030)
pH: 5 (ref 5.0–8.0)

## 2022-09-22 LAB — CBC WITH DIFFERENTIAL/PLATELET
Abs Immature Granulocytes: 0.03 10*3/uL (ref 0.00–0.07)
Basophils Absolute: 0 10*3/uL (ref 0.0–0.1)
Basophils Relative: 0 %
Eosinophils Absolute: 0 10*3/uL (ref 0.0–0.5)
Eosinophils Relative: 0 %
HCT: 40.5 % (ref 36.0–46.0)
Hemoglobin: 13.3 g/dL (ref 12.0–15.0)
Immature Granulocytes: 0 %
Lymphocytes Relative: 16 %
Lymphs Abs: 1.6 10*3/uL (ref 0.7–4.0)
MCH: 29.1 pg (ref 26.0–34.0)
MCHC: 32.8 g/dL (ref 30.0–36.0)
MCV: 88.6 fL (ref 80.0–100.0)
Monocytes Absolute: 0.6 10*3/uL (ref 0.1–1.0)
Monocytes Relative: 6 %
Neutro Abs: 7.7 10*3/uL (ref 1.7–7.7)
Neutrophils Relative %: 78 %
Platelets: 254 10*3/uL (ref 150–400)
RBC: 4.57 MIL/uL (ref 3.87–5.11)
RDW: 14.7 % (ref 11.5–15.5)
WBC: 9.9 10*3/uL (ref 4.0–10.5)
nRBC: 0 % (ref 0.0–0.2)

## 2022-09-22 LAB — COMPREHENSIVE METABOLIC PANEL
ALT: 17 U/L (ref 0–44)
AST: 18 U/L (ref 15–41)
Albumin: 3.3 g/dL — ABNORMAL LOW (ref 3.5–5.0)
Alkaline Phosphatase: 90 U/L (ref 38–126)
Anion gap: 11 (ref 5–15)
BUN: 8 mg/dL (ref 6–20)
CO2: 22 mmol/L (ref 22–32)
Calcium: 8.2 mg/dL — ABNORMAL LOW (ref 8.9–10.3)
Chloride: 102 mmol/L (ref 98–111)
Creatinine, Ser: 0.94 mg/dL (ref 0.44–1.00)
GFR, Estimated: >60 mL/min (ref >60)
Glucose, Bld: 151 mg/dL — ABNORMAL HIGH (ref 70–99)
Potassium: 3.2 mmol/L — ABNORMAL LOW (ref 3.5–5.1)
Sodium: 135 mmol/L (ref 135–145)
Total Bilirubin: 1 mg/dL (ref 0.3–1.2)
Total Protein: 7.6 g/dL (ref 6.5–8.1)

## 2022-09-22 LAB — LACTIC ACID, PLASMA
Lactic Acid, Venous: 1 mmol/L (ref 0.5–1.9)
Lactic Acid, Venous: 0.9 mmol/L (ref 0.5–1.9)

## 2022-09-22 LAB — MAGNESIUM: Magnesium: 1.6 mg/dL — ABNORMAL LOW (ref 1.7–2.4)

## 2022-09-22 LAB — POC URINE PREG, ED: Preg Test, Ur: NEGATIVE

## 2022-09-22 LAB — RESP PANEL BY RT-PCR (RSV, FLU A&B, COVID)  RVPGX2
Influenza A by PCR: NEGATIVE
Influenza B by PCR: NEGATIVE
Resp Syncytial Virus by PCR: NEGATIVE
SARS Coronavirus 2 by RT PCR: NEGATIVE

## 2022-09-22 LAB — LIPASE, BLOOD: Lipase: 22 U/L (ref 11–51)

## 2022-09-22 LAB — PROTIME-INR
INR: 1.1 (ref 0.8–1.2)
Prothrombin Time: 13.8 seconds (ref 11.4–15.2)

## 2022-09-22 MED ORDER — POTASSIUM CHLORIDE CRYS ER 20 MEQ PO TBCR
40.0000 meq | EXTENDED_RELEASE_TABLET | Freq: Once | ORAL | Status: AC
  Administered 2022-09-22: 40 meq via ORAL
  Filled 2022-09-22: qty 2

## 2022-09-22 MED ORDER — MAGNESIUM SULFATE 2 GM/50ML IV SOLN
2.0000 g | Freq: Once | INTRAVENOUS | Status: AC
  Administered 2022-09-22: 2 g via INTRAVENOUS
  Filled 2022-09-22: qty 50

## 2022-09-22 MED ORDER — LACTATED RINGERS IV BOLUS
1000.0000 mL | Freq: Once | INTRAVENOUS | Status: AC
  Administered 2022-09-22: 1000 mL via INTRAVENOUS

## 2022-09-22 MED ORDER — ACETAMINOPHEN 500 MG PO TABS
1000.0000 mg | ORAL_TABLET | Freq: Once | ORAL | Status: AC
  Administered 2022-09-22: 1000 mg via ORAL
  Filled 2022-09-22: qty 2

## 2022-09-22 MED ORDER — ONDANSETRON 4 MG PO TBDP: 4.0000 mg | ORAL_TABLET | Freq: Three times a day (TID) | ORAL | 0 refills | Status: AC | PRN

## 2022-09-22 MED ORDER — HYDROMORPHONE HCL 1 MG/ML IJ SOLN
0.5000 mg | Freq: Once | INTRAMUSCULAR | Status: AC
  Administered 2022-09-22: 0.5 mg via INTRAVENOUS
  Filled 2022-09-22: qty 0.5

## 2022-09-22 MED ORDER — KETOROLAC TROMETHAMINE 15 MG/ML IJ SOLN
15.0000 mg | Freq: Once | INTRAMUSCULAR | Status: AC
  Administered 2022-09-22: 15 mg via INTRAVENOUS
  Filled 2022-09-22: qty 1

## 2022-09-22 MED ORDER — ALUM & MAG HYDROXIDE-SIMETH 200-200-20 MG/5ML PO SUSP
30.0000 mL | Freq: Once | ORAL | Status: AC
  Administered 2022-09-22: 30 mL via ORAL
  Filled 2022-09-22: qty 30

## 2022-09-22 MED ORDER — IOHEXOL 300 MG/ML  SOLN
100.0000 mL | Freq: Once | INTRAMUSCULAR | Status: AC | PRN
  Administered 2022-09-22: 100 mL via INTRAVENOUS

## 2022-09-22 MED ORDER — LIDOCAINE VISCOUS HCL 2 % MT SOLN
15.0000 mL | Freq: Once | OROMUCOSAL | Status: AC
  Administered 2022-09-22: 15 mL via ORAL
  Filled 2022-09-22: qty 15

## 2022-09-22 MED ORDER — ONDANSETRON HCL 4 MG/2ML IJ SOLN
4.0000 mg | Freq: Once | INTRAMUSCULAR | Status: AC
  Administered 2022-09-22: 4 mg via INTRAVENOUS
  Filled 2022-09-22: qty 2

## 2022-09-22 MED ORDER — FAMOTIDINE IN NACL 20-0.9 MG/50ML-% IV SOLN
20.0000 mg | Freq: Once | INTRAVENOUS | Status: AC
  Administered 2022-09-22: 20 mg via INTRAVENOUS
  Filled 2022-09-22: qty 50

## 2022-09-22 NOTE — ED Provider Notes (Signed)
[facility] Provider Note   CSN: [id number] Arrival date & time: 09/22/22  [id number]     History  Chief Complaint  Patient presents with   Fever   Nausea   Weakness   Abdominal Pain   Diarrhea    Patient has been feeling really ill since yesterday morning   Dizziness   Headache    Carrie Mitchell is a 52 y.o. female.  CT of abdomen pelvis   Fever Associated symptoms: diarrhea, headaches, nausea and vomiting   Weakness Associated symptoms: abdominal pain, diarrhea, dizziness, fever, headaches, nausea and vomiting   Abdominal Pain Associated symptoms: diarrhea, fatigue, fever, nausea and vomiting   Diarrhea Associated symptoms: abdominal pain, fever, headaches and vomiting   Dizziness Associated symptoms: diarrhea, headaches, nausea, vomiting and weakness (Generalized)   Headache Associated symptoms: abdominal pain, diarrhea, dizziness, fatigue, fever, nausea, vomiting and weakness (Generalized)   Patient presents for multiple complaints.  Medical history includes cholelithiasis, prediabetes.  Symptoms began yesterday morning, shortly after she had gotten to work.  She experienced periumbilical pain.  When she got home, she had nausea and vomiting.  She has had persistent nausea and abdominal pain.  Symptoms worsen after trying to eat or drink.  She has been tolerating only small sips of ginger ale and crackers.  Pain radiates to bilateral flanks and bilateral lower back.  She has had diarrhea.  She is postmenopausal.  Surgical history includes tubal ligation and gallbladder removal.     Home Medications Prior to Admission medications   Medication Sig Start Date End Date Taking? Authorizing Provider  clobetasol cream (TEMOVATE) [id number] % Apply 1 Application topically 2 (two) times daily. Patient not taking: Reported on 09/22/2022 05/29/22   Alvira Monday, [place]      Allergies    Patient has no known allergies.    Review of Systems    Review of Systems  Constitutional:  Positive for activity change, appetite change, fatigue and fever.  Gastrointestinal:  Positive for abdominal pain, diarrhea, nausea and vomiting.  Neurological:  Positive for dizziness, weakness (Generalized) and headaches.  All other systems reviewed and are negative.   Physical Exam Updated Vital Signs BP (!) 115/42 (BP Location: Right Arm)   Pulse 73   Temp 99.1 F (37.3 C) (Oral)   Resp (!) 22   Ht 5\' 11"  (1.803 m)   Wt (!) 163.3 kg   LMP 06/29/2013   SpO2 92%   BMI 50.21 kg/m  Physical Exam Vitals and nursing note reviewed.  Constitutional:      General: She is not in acute distress.    Appearance: She is well-developed. She is not ill-appearing, toxic-appearing or diaphoretic.  HENT:     Head: Normocephalic and atraumatic.  Eyes:     Conjunctiva/sclera: Conjunctivae normal.  Cardiovascular:     Rate and Rhythm: Regular rhythm. Tachycardia present.     Heart sounds: No murmur heard. Pulmonary:     Effort: Pulmonary effort is normal. No respiratory distress.     Breath sounds: Normal breath sounds. No wheezing or rales.  Chest:     Chest wall: No tenderness.  Abdominal:     Palpations: Abdomen is soft.     Tenderness: There is generalized abdominal tenderness. There is no guarding or rebound.  Musculoskeletal:        General: No swelling.     Cervical back: Neck supple.  Skin:    General: Skin is warm and dry.  Coloration: Skin is not cyanotic, jaundiced or pale.  Neurological:     General: No focal deficit present.     Mental Status: She is alert and oriented to person, place, and time.  Psychiatric:        Mood and Affect: Mood normal.        Behavior: Behavior normal.     ED Results / Procedures / Treatments   Labs (all labs ordered are listed, but only abnormal results are displayed) Labs Reviewed  COMPREHENSIVE METABOLIC PANEL - Abnormal; Notable for the following components:      Result Value   Potassium  3.2 (*)    Glucose, Bld 151 (*)    Calcium 8.2 (*)    Albumin 3.3 (*)    All other components within normal limits  URINALYSIS, ROUTINE W REFLEX MICROSCOPIC - Abnormal; Notable for the following components:   APPearance HAZY (*)    Protein, ur 30 (*)    All other components within normal limits  MAGNESIUM - Abnormal; Notable for the following components:   Magnesium 1.6 (*)    All other components within normal limits  CULTURE, BLOOD (ROUTINE X 2)  CULTURE, BLOOD (ROUTINE X 2)  RESP PANEL BY RT-PCR (RSV, FLU A&B, COVID)  RVPGX2  LIPASE, BLOOD  CBC WITH DIFFERENTIAL/PLATELET  LACTIC ACID, PLASMA  LACTIC ACID, PLASMA  PROTIME-INR  OCCULT BLOOD X 1 CARD TO LAB, STOOL  POC URINE PREG, ED    EKG EKG Interpretation  Date/Time:  Tuesday September 22 2022 08:36:41 EDT Ventricular Rate:  95 PR Interval:  148 QRS Duration: 92 QT Interval:  314 QTC Calculation: 395 R Axis:   40 Text Interpretation: Sinus rhythm Nonspecific T abnormalities, diffuse leads Confirmed by Godfrey Pick [phone]) on 09/22/2022 9:57:37 AM  Radiology US Abdomen Limited  Addendum Date: 09/22/2022   ADDENDUM REPORT: 09/22/2022 13:42 ADDENDUM: Additional history: Patient has had prior cholecystectomy. The structure in the right upper quadrant which appears to be the gallbladder is likely dilated remnant of cystic duct, possibly containing a calculus. Electronically Signed   By: Miachel Roux M.D.   On: 09/22/2022 13:42   Result Date: 09/22/2022 CLINICAL DATA:  Abdominal pain EXAM: ULTRASOUND ABDOMEN LIMITED RIGHT UPPER QUADRANT COMPARISON:  CT abdomen pelvis 09/22/2022 FINDINGS: Gallbladder: Gallstones: Present Sludge: None Gallbladder Wall: Within normal limits given contracted configuration of the gallbladder Pericholecystic fluid: None Sonographic Murphy's Sign: Negative per technologist Common bile duct: Diameter: 7 mm Liver: Parenchymal echogenicity: Diffusely increased Contours: Normal Lesions: None Portal vein: Patent.   Hepatopetal flow Other: None. IMPRESSION: 1. Diffuse increased echogenicity of the hepatic parenchyma is a nonspecific indicator of hepatocellular dysfunction, most commonly steatosis. 2. Cholelithiasis without additional sonographic evidence of acute cholecystitis. 3. Borderline dilatation of the common bile duct. If the patient's liver function tests are indicative of biliary obstruction, further evaluation with ERCP or MRCP should be considered. Electronically Signed: By: Miachel Roux M.D. On: 09/22/2022 12:18   CT ABDOMEN PELVIS W CONTRAST  Result Date: 09/22/2022 CLINICAL DATA:  Acute abdominal pain EXAM: CT ABDOMEN AND PELVIS WITH CONTRAST TECHNIQUE: Multidetector CT imaging of the abdomen and pelvis was performed using the standard protocol following bolus administration of intravenous contrast. RADIATION DOSE REDUCTION: This exam was performed according to the departmental dose-optimization program which includes automated exposure control, adjustment of the mA and/or kV according to patient size and/or use of iterative reconstruction technique. CONTRAST:  128mL OMNIPAQUE IOHEXOL 300 MG/ML  SOLN COMPARISON:  Ultrasound September 2014 FINDINGS: Lower chest:  Breathing motion. Lung bases are grossly clear. No pleural effusion Hepatobiliary: Diffuse fatty liver infiltration. Contracted gallbladder with stones. Patent portal vein. Pancreas: Unremarkable. No pancreatic ductal dilatation or surrounding inflammatory changes. Spleen: Normal in size without focal abnormality.  Small splenule. Adrenals/Urinary Tract: Adrenal glands are preserved. No enhancing renal mass or collecting system dilatation. The ureters have a normal course and caliber extending down to the bladder. Preserved contours of the urinary bladder. Stomach/Bowel: On this non oral contrast exam large bowel has a normal course and caliber overall mild stool. Normal appendix. Slightly redundant course to the sigmoid colon extending into the left  upper quadrant. The stomach is nondilated. Small bowel is nondilated. Vascular/Lymphatic: Normal caliber aorta and IVC with mild vascular calcifications. No discrete abnormal lymph node enlargement seen in the abdomen and pelvis. Reproductive: Uterus and bilateral adnexa are unremarkable. Other: No abdominal wall hernia or abnormality. No abdominopelvic ascites. Musculoskeletal: Mild degenerative changes of the spine and pelvis. There is some areas of disc bulging seen particularly at L4-5 and L5-S1. IMPRESSION: Fatty liver infiltration. No bowel obstruction, free air or free fluid.  Normal appendix. Contracted gallbladder with stones Electronically Signed   By: Jill Side M.D.   On: 09/22/2022 10:15    Procedures Procedures    Medications Ordered in ED Medications  alum & mag hydroxide-simeth (MAALOX/MYLANTA) 200-200-20 MG/5ML suspension 30 mL (has no administration in time range)    And  lidocaine (XYLOCAINE) 2 % viscous mouth solution 15 mL (has no administration in time range)  famotidine (PEPCID) IVPB 20 mg premix (has no administration in time range)  HYDROmorphone (DILAUDID) injection 0.5 mg (0.5 mg Intravenous Given 09/22/22 0851)  ondansetron (ZOFRAN) injection 4 mg (4 mg Intravenous Given 09/22/22 0851)  lactated ringers bolus 1,000 mL (0 mLs Intravenous Stopped 09/22/22 1057)  acetaminophen (TYLENOL) tablet 1,000 mg (1,000 mg Oral Given 09/22/22 0851)  iohexol (OMNIPAQUE) 300 MG/ML solution 100 mL (100 mLs Intravenous Contrast Given 09/22/22 0948)  potassium chloride SA (KLOR-CON M) CR tablet 40 mEq (40 mEq Oral Given 09/22/22 1049)  magnesium sulfate IVPB 2 g 50 mL (0 g Intravenous Stopped 09/22/22 1354)  ketorolac (TORADOL) 15 MG/ML injection 15 mg (15 mg Intravenous Given 09/22/22 1400)    ED Course/ Medical Decision Making/ A&P                             Medical Decision Making Amount and/or Complexity of Data Reviewed Labs: ordered. Radiology: ordered.  Risk OTC  drugs. Prescription drug management.   This patient presents to the ED for concern of abdominal pain, nausea, vomiting, diarrhea, this involves an extensive number of treatment options, and is a complaint that carries with it a high risk of complications and morbidity.  The differential diagnosis includes gastroenteritis, colitis, SBO, constipation   Co morbidities that complicate the patient evaluation  cholelithiasis s/p cholecystectomy, prediabetes   Additional history obtained:  Additional history obtained from N/A External records from outside source obtained and reviewed including EMR   Lab Tests:  I Ordered, and personally interpreted labs.  The pertinent results include: Hypokalemia and hypomagnesemia are present.  He hemoglobin is normal.  No leukocytosis is present.  Lipase and hepatobiliary enzymes are normal.  There is no evidence of UTI.   Imaging Studies ordered:  I ordered imaging studies including, right upper quadrant ultrasound I independently visualized and interpreted imaging which showed fatty liver, dilated remnant of gallbladder cystic duct, possibly containing  a calculus.  No acute findings to explain patient's symptoms. I agree with the radiologist interpretation   Cardiac Monitoring: / EKG:  The patient was maintained on a cardiac monitor.  I personally viewed and interpreted the cardiac monitored which showed an underlying rhythm of: Sinus rhythm   Problem List / ED Course / Critical interventions / Medication management  Patient presents for abdominal pain radiating to flanks and back, nausea, vomiting, diarrhea.  She is febrile on arrival in the ED.  On exam, she is alert and oriented.  She describes ongoing severe central abdominal pain and nausea.  Generalized abdominal tenderness is present.  IV fluids, pain medication, and antiemetic were ordered.  Laboratory workup was initiated.  Lab results are reassuring.  Patient underwent CT scan of abdomen  and pelvis.  Scan showed fatty liver.  There is what appears to be a gallbladder with stones present.  This is not consistent with patient's history of cholecystectomy.  Right upper quadrant ultrasound was ordered to further characterize.  On ultrasound, there is also finding of contracted gallbladder.  I spoke with the radiologist about this.  Radiologist reviewed further and does feel like this is a remnant of cystic duct.  This would not explain her abdominal pain.  Patient symptoms likely secondary to gastroenteritis.  Toradol was ordered for ongoing analgesia.  Patient was treated empirically for gastritis/GERD as well.  She was able to eat and drink while in the ED.  She was discharged in stable condition. I ordered medication including Dilaudid, Toradol, GI cocktail for analgesia; Zofran for nausea; Tylenol for antipyresis; potassium chloride for hypokalemia; magnesium sulfate for hypomagnesemia; IV fluids for hydration Reevaluation of the patient after these medicines showed that the patient improved I have reviewed the patients home medicines and have made adjustments as needed   Social Determinants of Health:  Has PCP         Final Clinical Impression(s) / ED Diagnoses Final diagnoses:  Hypokalemia  Hypomagnesemia  Gastroenteritis    Rx / DC Orders ED Discharge Orders     None         Godfrey Pick, MD 09/22/22 1751

## 2022-09-22 NOTE — Discharge Instructions (Signed)
A medication called Zofran was sent to your pharmacy.  This is to treat nausea.  Take as needed.  Continue to stay hydrated.  Return to the emergency department for any new or worsening symptoms of concern.

## 2022-09-22 NOTE — ED Triage Notes (Signed)
Patient began feeling ill 09/21/22 morning. Has tried Immodium, Peptobismal, and sips of ginger ale. Has vomitted right after anything tried to ingest.She says her bowel movement 05/23/23 noon are black and tarry. She says she has not been on any antibiotics lately.

## 2022-09-23 ENCOUNTER — Telehealth: Payer: Self-pay

## 2022-09-23 NOTE — Transitions of Care (Post Inpatient/ED Visit) (Signed)
   09/23/2022  Name: Carrie Mitchell MRN: [id number] DOB: 1970/10/13  Today's TOC FU Call Status: Today's TOC FU Call Status:: Unsuccessul Call (1st Attempt) Unsuccessful Call (1st Attempt) Date: 09/23/22  Attempted to reach the patient regarding the most recent Inpatient/ED visit.  Follow Up Plan: Additional outreach attempts will be made to reach the patient to complete the Transitions of Care (Post Inpatient/ED visit) call.   Signature  Glenna Durand LPN [facility] Team Direct dial:  [phone]

## 2022-09-27 LAB — CULTURE, BLOOD (ROUTINE X 2)
Culture: NO GROWTH
Culture: NO GROWTH

## 2022-10-15 ENCOUNTER — Other Ambulatory Visit: Payer: Self-pay | Admitting: Family Medicine

## 2022-10-15 DIAGNOSIS — L309 Dermatitis, unspecified: Secondary | ICD-10-CM

## 2022-10-16 MED ORDER — CLOBETASOL PROPIONATE 0.05 % EX CREA
1.0000 | TOPICAL_CREAM | Freq: Two times a day (BID) | CUTANEOUS | 0 refills | Status: AC
Start: 2022-10-16 — End: ?

## 2023-04-16 ENCOUNTER — Encounter: Payer: Self-pay | Admitting: Family Medicine

## 2023-04-16 ENCOUNTER — Ambulatory Visit: Payer: BC Managed Care – PPO | Admitting: Family Medicine

## 2023-04-16 VITALS — BP 140/90 | HR 79 | Ht 70.0 in | Wt 358.0 lb

## 2023-04-16 DIAGNOSIS — G8929 Other chronic pain: Secondary | ICD-10-CM

## 2023-04-16 DIAGNOSIS — E559 Vitamin D deficiency, unspecified: Secondary | ICD-10-CM | POA: Diagnosis not present

## 2023-04-16 DIAGNOSIS — M1711 Unilateral primary osteoarthritis, right knee: Secondary | ICD-10-CM

## 2023-04-16 DIAGNOSIS — Z1211 Encounter for screening for malignant neoplasm of colon: Secondary | ICD-10-CM

## 2023-04-16 DIAGNOSIS — R7301 Impaired fasting glucose: Secondary | ICD-10-CM | POA: Diagnosis not present

## 2023-04-16 DIAGNOSIS — R030 Elevated blood-pressure reading, without diagnosis of hypertension: Secondary | ICD-10-CM

## 2023-04-16 DIAGNOSIS — R300 Dysuria: Secondary | ICD-10-CM

## 2023-04-16 DIAGNOSIS — E7849 Other hyperlipidemia: Secondary | ICD-10-CM | POA: Diagnosis not present

## 2023-04-16 DIAGNOSIS — M25511 Pain in right shoulder: Secondary | ICD-10-CM

## 2023-04-16 DIAGNOSIS — Z1159 Encounter for screening for other viral diseases: Secondary | ICD-10-CM | POA: Diagnosis not present

## 2023-04-16 DIAGNOSIS — E038 Other specified hypothyroidism: Secondary | ICD-10-CM | POA: Diagnosis not present

## 2023-04-16 MED ORDER — NAPROXEN 500 MG PO TABS
500.0000 mg | ORAL_TABLET | Freq: Two times a day (BID) | ORAL | 1 refills | Status: DC
Start: 2023-04-16 — End: 2023-08-18

## 2023-04-16 NOTE — Patient Instructions (Addendum)
I appreciate the opportunity to provide care to you today!    Follow up:  2 weeks BP  Labs: please stop by the lab today to get your blood drawn (CBC, CMP, TSH, Lipid profile, HgA1c, Vit D)  Screening: Hep C  Hypertension Management  Your current blood pressure is above the target goal of <140/90 mmHg. To address this, please implement lifestyle changes with a low-sodium diet increase physical activity.  Check your blood pressure once daily. If your first reading is >140/90 mmHg, wait at least 10 minutes and recheck your blood pressure. Diet and Lifestyle: Adhere to a low-sodium diet, limiting intake to less than 1500 mg daily, and increase your physical activity. Avoid over-the-counter NSAIDs such as ibuprofen and naproxen while on this medication. Hydration and Nutrition: Stay well-hydrated by drinking at least 64 ounces of water daily. Increase your servings of fruits and vegetables and avoid excessive sodium in your diet. Long-Term Considerations: Uncontrolled hypertension can increase the risk of cardiovascular diseases, including stroke, coronary artery disease, and heart failure.  Please report to the emergency department if your blood pressure exceeds 180/120 and is accompanied by symptoms such as headaches, chest pain, palpitations, blurred vision, or dizziness.  -start taking naproxen 500 mg BID for shoulder and knee pain Here are nonpharmacological management strategies for shoulder and knee pain from arthritis:  Shoulder Pain from Arthritis: -Rest and Activity Modification: Avoid activities that aggravate shoulder pain, such as lifting heavy objects or repetitive overhead movements. Modify daily tasks to reduce strain on the shoulder. -Cold and Heat Therapy: Apply ice packs to the shoulder for 15-20 minutes to reduce inflammation, especially after activity. Use heat therapy (heating pads or warm baths) to relax muscles and relieve stiffness. -Stretching and Strengthening  Exercises: Engage in gentle range-of-motion exercises to maintain flexibility. Strengthen the muscles around the shoulder joint, particularly the rotator cuff, with light resistance exercises. -Posture Improvement: Maintaining good posture while sitting or standing reduces strain on the shoulder joint. Use ergonomic adjustments at workstations to support proper shoulder alignment. -Physical Therapy: A physical therapist can guide you through specific exercises tailored to strengthening shoulder muscles and improving joint function. -Massage Therapy: Gentle massage can help relieve muscle tension and improve circulation in the shoulder area. Knee Pain from Arthritis: -Weight Management: Losing excess weight can significantly reduce the load on the knee joints and help alleviate pain. -Low-Impact Exercises: Engage in low-impact activities such as swimming, cycling, or walking to strengthen muscles around the knee without putting too much stress on the joint. Strengthening exercises targeting the quadriceps and hamstrings can improve knee support. -Stretching and Flexibility Exercises: Regular stretching exercises can improve flexibility and reduce stiffness in the knee joint. -Cold and Heat Therapy: Apply ice packs to the knee to reduce inflammation and numb pain after activity. Use heat therapy to relieve stiffness and improve circulation. -Knee Braces and Supports: Wearing a knee brace or compression sleeve can provide support and stability to the knee, particularly during physical activity. -Orthotics and Footwear: Wearing shoes with good arch support or using custom orthotics can improve knee alignment and reduce pain. -Physical Therapy: A physical therapist can create a personalized plan to strengthen muscles around the knee and improve mobility.  Please call and follow up with [facility] at [phone]   Attached with your AVS, you will find valuable resources for self-education.  I highly recommend dedicating some time to thoroughly examine them.   Please continue to a heart-healthy diet and increase your physical activities. Try to exercise  for at least five days a week.    It was a pleasure to see you and I look forward to continuing to work together on your health and well-being. Please do not hesitate to call the office if you need care or have questions about your care.  In case of emergency, please visit the Emergency Department for urgent care, or contact our clinic at [phone] to schedule an appointment. We're here to help you!   Have a wonderful day and week. With Gratitude, Gilmore Laroche MSN, FNP-BC

## 2023-04-16 NOTE — Progress Notes (Unsigned)
Established Patient Office Visit  Subjective:  Patient ID: Carrie Mitchell, female    DOB: 05/06/1971  Age: 52 y.o. MRN: [id number]  CC:  Chief Complaint  Patient presents with   Knee Pain    Pt reports knee pain for many years from a fall a long time ago, pain has now worsened.    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     HPI Carrie Mitchell is a 52 y.o. female with past medical history of *** presents for f/u of *** chronic medical conditions.  Past Medical History:  Diagnosis Date   Cellulitis    Cholelithiasis 03/18/2013   Dermatitis 02/16/2012   Prediabetes     Past Surgical History:  Procedure Laterality Date   BREAST SURGERY     CHOLECYSTECTOMY N/A 03/20/2013   Procedure: LAPAROSCOPIC CHOLECYSTECTOMY;  Surgeon: Fabio Bering, MD;  Location: [facility];  Service: General;  Laterality: N/A;   TUBAL LIGATION      Family History  Problem Relation Age of Onset   Diabetes Mother    Hypertension Mother    Stroke Mother    Heart attack Father    Crohn's disease Sister    Hypertension Brother    Hypertension Brother     Social History   Socioeconomic History   Marital status: Married    Spouse name: Not on file   Number of children: Not on file   Years of education: Not on file   Highest education level: GED or equivalent  Occupational History   Not on file  Tobacco Use   Smoking status: Former    Types: Cigarettes   Smokeless tobacco: Never  Vaping Use   Vaping status: Never Used  Substance and Sexual Activity   Alcohol use: No   Drug use: No   Sexual activity: Yes    Birth control/protection: Surgical  Other Topics Concern   Not on file  Social History Narrative   Not on file   Social Determinants of Health   Financial Resource Strain: Low Risk  (04/15/2023)   Overall Financial Resource Strain (CARDIA)    Difficulty of Paying Living  Expenses: Not hard at all  Food Insecurity: No Food Insecurity (04/15/2023)   Hunger Vital Sign    Worried About Running Out of Food in the Last Year: Never true    Ran Out of Food in the Last Year: Never true  Transportation Needs: No Transportation Needs (04/15/2023)   PRAPARE - Administrator, Civil Service (Medical): No    Lack of Transportation (Non-Medical): No  Physical Activity: Unknown (04/15/2023)   Exercise Vital Sign    Days of Exercise per Week: 0 days    Minutes of Exercise per Session: Not on file  Stress: No Stress Concern Present (04/15/2023)   [employer] of Occupational Health - Occupational Stress Questionnaire    Feeling of Stress : Not at all  Social Connections: Socially Integrated (04/15/2023)   Social Connection and Isolation Panel [NHANES]    Frequency of Communication with Friends and Family: More than three times a week    Frequency of Social Gatherings with Friends and Family: Once a week    Attends Religious Services: More than 4 times per year    Active Member of [employer] or Organizations: Yes    Attends Banker Meetings: More  than 4 times per year    Marital Status: Married  Catering manager Violence: Not on file    Outpatient Medications Prior to Visit  Medication Sig Dispense Refill   clobetasol cream (TEMOVATE) 0.05 % Apply 1 Application topically 2 (two) times daily. 30 g 0   ondansetron (ZOFRAN-ODT) 4 MG disintegrating tablet Take 1 tablet (4 mg total) by mouth every 8 (eight) hours as needed for nausea or vomiting. 20 tablet 0   No facility-administered medications prior to visit.    No Known Allergies  ROS Review of Systems    Objective:    Physical Exam  BP (!) 140/90 (BP Location: Left Arm)   Pulse 79   Ht 5\' 10"  (1.778 m)   Wt (!) 358 lb (162.4 kg)   LMP 06/29/2013   SpO2 94%   BMI 51.37 kg/m  Wt Readings from Last 3 Encounters:  04/16/23 (!) 358 lb (162.4 kg)  09/22/22 (!) 360 lb (163.3 kg)   05/29/22 (!) 363 lb (164.7 kg)    No results found for: "TSH" Lab Results  Component Value Date   WBC 9.9 09/22/2022   HGB 13.3 09/22/2022   HCT 40.5 09/22/2022   MCV 88.6 09/22/2022   PLT 254 09/22/2022   Lab Results  Component Value Date   NA 135 09/22/2022   K 3.2 (L) 09/22/2022   CO2 22 09/22/2022   GLUCOSE 151 (H) 09/22/2022   BUN 8 09/22/2022   CREATININE 0.94 09/22/2022   BILITOT 1.0 09/22/2022   ALKPHOS 90 09/22/2022   AST 18 09/22/2022   ALT 17 09/22/2022   PROT 7.6 09/22/2022   ALBUMIN 3.3 (L) 09/22/2022   CALCIUM 8.2 (L) 09/22/2022   ANIONGAP 11 09/22/2022   No results found for: "CHOL" No results found for: "HDL" No results found for: "LDLCALC" No results found for: "TRIG" No results found for: "CHOLHDL" No results found for: "HGBA1C"    Assessment & Plan:  Colon cancer screening    Follow-up: No follow-ups on file.   Gilmore Laroche, FNP

## 2023-04-17 ENCOUNTER — Telehealth: Payer: Self-pay | Admitting: Family Medicine

## 2023-04-17 DIAGNOSIS — G8929 Other chronic pain: Secondary | ICD-10-CM | POA: Insufficient documentation

## 2023-04-17 DIAGNOSIS — R300 Dysuria: Secondary | ICD-10-CM | POA: Insufficient documentation

## 2023-04-17 LAB — CMP14+EGFR
ALT: 15 [IU]/L (ref 0–32)
AST: 15 [IU]/L (ref 0–40)
Albumin: 4 g/dL (ref 3.8–4.9)
Alkaline Phosphatase: 120 [IU]/L (ref 44–121)
BUN/Creatinine Ratio: 15 (ref 9–23)
BUN: 12 mg/dL (ref 6–24)
Bilirubin Total: 0.4 mg/dL (ref 0.0–1.2)
CO2: 23 mmol/L (ref 20–29)
Calcium: 9.6 mg/dL (ref 8.7–10.2)
Chloride: 104 mmol/L (ref 96–106)
Creatinine, Ser: 0.82 mg/dL (ref 0.57–1.00)
Globulin, Total: 3.4 g/dL (ref 1.5–4.5)
Glucose: 143 mg/dL — ABNORMAL HIGH (ref 70–99)
Potassium: 4.3 mmol/L (ref 3.5–5.2)
Sodium: 140 mmol/L (ref 134–144)
Total Protein: 7.4 g/dL (ref 6.0–8.5)
eGFR: 85 mL/min/{1.73_m2} (ref >59)

## 2023-04-17 LAB — CBC WITH DIFFERENTIAL/PLATELET
Basophils Absolute: 0 10*3/uL (ref 0.0–0.2)
Basos: 0 %
EOS (ABSOLUTE): 0.3 10*3/uL (ref 0.0–0.4)
Eos: 4 %
Hematocrit: 44.1 % (ref 34.0–46.6)
Hemoglobin: 13.6 g/dL (ref 11.1–15.9)
Immature Grans (Abs): 0 10*3/uL (ref 0.0–0.1)
Immature Granulocytes: 0 %
Lymphocytes Absolute: 2.4 10*3/uL (ref 0.7–3.1)
Lymphs: 30 %
MCH: 27.9 pg (ref 26.6–33.0)
MCHC: 30.8 g/dL — ABNORMAL LOW (ref 31.5–35.7)
MCV: 91 fL (ref 79–97)
Monocytes Absolute: 0.4 10*3/uL (ref 0.1–0.9)
Monocytes: 5 %
Neutrophils Absolute: 4.7 10*3/uL (ref 1.4–7.0)
Neutrophils: 61 %
Platelets: 343 10*3/uL (ref 150–450)
RBC: 4.87 x10E6/uL (ref 3.77–5.28)
RDW: 13.8 % (ref 11.7–15.4)
WBC: 7.8 10*3/uL (ref 3.4–10.8)

## 2023-04-17 LAB — HEMOGLOBIN A1C
Est. average glucose Bld gHb Est-mCnc: 189 mg/dL
Hgb A1c MFr Bld: 8.2 % — ABNORMAL HIGH (ref 4.8–5.6)

## 2023-04-17 LAB — TSH+FREE T4
Free T4: 1.3 ng/dL (ref 0.82–1.77)
TSH: 1.45 u[IU]/mL (ref 0.450–4.500)

## 2023-04-17 LAB — HEPATITIS C ANTIBODY: Hep C Virus Ab: NONREACTIVE

## 2023-04-17 LAB — VITAMIN D 25 HYDROXY (VIT D DEFICIENCY, FRACTURES): Vit D, 25-Hydroxy: 10.3 ng/mL — ABNORMAL LOW (ref 30.0–100.0)

## 2023-04-17 LAB — LIPID PANEL
Chol/HDL Ratio: 3.5 ratio (ref 0.0–4.4)
Cholesterol, Total: 194 mg/dL (ref 100–199)
HDL: 55 mg/dL (ref >39)
LDL Chol Calc (NIH): 122 mg/dL — ABNORMAL HIGH (ref 0–99)
Triglycerides: 96 mg/dL (ref 0–149)
VLDL Cholesterol Cal: 17 mg/dL (ref 5–40)

## 2023-04-17 NOTE — Assessment & Plan Note (Signed)
The patient is encouraged to implement lifestyle modifications, including a low-sodium diet and increased physical activity. The cardiovascular implications of uncontrolled high blood pressure were discussed. The patient is advised to report to the emergency department if their blood pressure exceeds 180/120 and is accompanied by symptoms such as headaches, chest pain, palpitations, blurred vision, or dizziness.

## 2023-04-17 NOTE — Assessment & Plan Note (Signed)
Pending urinalysis To help prevent future urinary tract infections (UTIs), consider the following practices:  Avoid Prolonged Urination: Do not hold urine for extended periods, as this can stretch the bladder and create an environment conducive to bacterial growth. Prompt Bladder Emptying: Empty your bladder as soon as you feel the urge. Post-Intercourse Hygiene: Empty your bladder soon after intercourse to reduce the risk of infection. Shower Instead of Bath: Opt for showers rather than baths to minimize bacterial exposure. Proper Wiping Technique: Wipe from front to back after urinating and bowel movements to prevent the transfer of bacteria from the anal region to the vagina and urethra. Hydration: Drink a full glass of water regularly to help flush bacteria from your system.

## 2023-04-17 NOTE — Assessment & Plan Note (Signed)
An imaging study will be obtained to rule out follow-up pathology as the patient complains of sharp pain in her left shoulder with movement. Supportive care is encouraged, as she has arthritis, including the avoidance of aggravating activities, heat and cold therapy, stretching and strengthening exercises for the shoulders, and maintaining proper posture when bending and lifting during work.

## 2023-04-17 NOTE — Assessment & Plan Note (Addendum)
The patient is encouraged to follow up with orthopedic surgery for possible steroid injections if applicable. Supportive care with rest, avoidance of aggravating activities, cold and heat therapy, and over-the-counter analgesics as needed is recommended. The patient is also encouraged to use Voltaren gel as needed.

## 2023-04-19 NOTE — Telephone Encounter (Signed)
Spoke with pt states she will come by the office today if possible, if not in the am.

## 2023-04-20 ENCOUNTER — Other Ambulatory Visit: Payer: Self-pay | Admitting: Family Medicine

## 2023-04-20 ENCOUNTER — Ambulatory Visit (HOSPITAL_COMMUNITY): Payer: BC Managed Care – PPO | Attending: Family Medicine

## 2023-04-20 ENCOUNTER — Other Ambulatory Visit: Payer: Self-pay

## 2023-04-20 DIAGNOSIS — M542 Cervicalgia: Secondary | ICD-10-CM | POA: Diagnosis not present

## 2023-04-20 DIAGNOSIS — G8929 Other chronic pain: Secondary | ICD-10-CM

## 2023-04-20 DIAGNOSIS — R300 Dysuria: Secondary | ICD-10-CM

## 2023-04-20 DIAGNOSIS — M25511 Pain in right shoulder: Secondary | ICD-10-CM | POA: Insufficient documentation

## 2023-04-20 DIAGNOSIS — R293 Abnormal posture: Secondary | ICD-10-CM

## 2023-04-20 DIAGNOSIS — E1169 Type 2 diabetes mellitus with other specified complication: Secondary | ICD-10-CM

## 2023-04-20 DIAGNOSIS — E1165 Type 2 diabetes mellitus with hyperglycemia: Secondary | ICD-10-CM

## 2023-04-20 DIAGNOSIS — E559 Vitamin D deficiency, unspecified: Secondary | ICD-10-CM

## 2023-04-20 MED ORDER — ROSUVASTATIN CALCIUM 10 MG PO TABS
10.0000 mg | ORAL_TABLET | Freq: Every day | ORAL | 3 refills | Status: AC
Start: 2023-04-20 — End: ?

## 2023-04-20 MED ORDER — TIRZEPATIDE 2.5 MG/0.5ML ~~LOC~~ SOAJ: 2.5000 mg | SUBCUTANEOUS | 0 refills | Status: DC

## 2023-04-20 MED ORDER — METFORMIN HCL 500 MG PO TABS
500.0000 mg | ORAL_TABLET | Freq: Two times a day (BID) | ORAL | 1 refills | Status: DC
Start: 2023-04-20 — End: 2023-08-02

## 2023-04-20 MED ORDER — VITAMIN D (ERGOCALCIFEROL) 1.25 MG (50000 UNIT) PO CAPS
50000.0000 [IU] | ORAL_CAPSULE | ORAL | 1 refills | Status: DC
Start: 2023-04-20 — End: 2023-07-01

## 2023-04-20 NOTE — Therapy (Signed)
OUTPATIENT PHYSICAL THERAPY SHOULDER EVALUATION   Patient Name: Carrie Mitchell MRN: [id number] DOB:08-26-1970, 52 y.o., female Today's Date: 04/20/2023  END OF SESSION:  PT End of Session - 04/20/23 0846     Visit Number 1    Number of Visits 6    Date for PT Re-Evaluation 06/01/23    Authorization Type Blue Cross Blue Shield    Authorization Time Period auth submitted at eval    PT Start Time [phone]    PT Stop Time 0930    PT Time Calculation (min) 40 min    Activity Tolerance Patient tolerated treatment well    Behavior During Therapy [facility] for tasks assessed/performed             Past Medical History:  Diagnosis Date   Cellulitis    Cholelithiasis 03/18/2013   Dermatitis 02/16/2012   Prediabetes    Past Surgical History:  Procedure Laterality Date   BREAST SURGERY     CHOLECYSTECTOMY N/A 03/20/2013   Procedure: LAPAROSCOPIC CHOLECYSTECTOMY;  Surgeon: Fabio Bering, MD;  Location: [facility];  Service: General;  Laterality: N/A;   TUBAL LIGATION     Patient Active Problem List   Diagnosis Date Noted   Dysuria 04/17/2023   Chronic right shoulder pain 04/17/2023   Elevated BP without diagnosis of hypertension 05/30/2022   Cellulitis 01/30/2014   Urinary incontinence, urge 07/20/2013   Acute renal insufficiency 03/18/2013   Cholelithiasis 03/18/2013   Abdominal pain, right upper quadrant 03/18/2013   Cellulitis of leg, right 03/18/2013   Chest pain 03/17/2013   Nausea & vomiting 03/17/2013   Dermatitis 02/16/2012   Hypokalemia 02/14/2012   Obesity 11/09/2011   Degenerative joint disease of right knee 04/25/2009    PCP: Gilmore Laroche, FNP  REFERRING PROVIDER: Gilmore Laroche, FNP  REFERRING DIAG: [phone] (ICD-10-CM) - Chronic right shoulder pain  THERAPY DIAG:  Chronic right shoulder pain - Plan: PT plan of care cert/re-cert  Abnormal posture - Plan: PT plan of care cert/re-cert  Neck pain - Plan: PT plan of care cert/re-cert  Rationale for  Evaluation and Treatment: Rehabilitation  ONSET DATE: 3 months.   SUBJECTIVE:                                                                                                                                                                                      SUBJECTIVE STATEMENT: Saw Dr. Denny Levy Friday and she ordered a scan for her shoulder but has not been contacted yet regarded when to have that done.  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  She does have some limitation with the use of her  right arm (shortened, smaller and no fingers on right hand) but does use it to lift and pull on the patient's she works with at [employer].  Gave her Naproxen to take as well but she has not really taken it.  It does not hurt all the time but when it does hurt it can go up to a 10/10; she has been out of work a little while for 2 deaths in the family.   Hand dominance: Left  PERTINENT HISTORY: See above  PAIN:  Are you having pain? Yes: NPRS scale: 0-10/10 Pain location: right upper trap; can "run up the neck" Pain description: pressure, sharp, stabbing, stingin Aggravating factors: work Relieving factors: rest  PRECAUTIONS: None    WEIGHT BEARING RESTRICTIONS: No  FALLS:  Has patient fallen in last 6 months? 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  DIAGNOSTIC FINDINGS:  Awaiting call for scan  PATIENT SURVEYS:  FOTO 61  COGNITION: Overall cognitive status: Within functional limits for tasks assessed     SENSATION: WFL  POSTURE: Forward head and rounded shoulders; right shoulder elevated more than left   CERVICAL ROM:   Active ROM A/PROM (deg) eval  Flexion 41*  Extension 42  Right lateral flexion 21*  Left lateral flexion 36  Right rotation 63*  Left  rotation 80   (Blank rows = not tested)   UPPER EXTREMITY ROM:   Active ROM Right eval Left eval  Shoulder flexion 126 135  Shoulder extension    Shoulder abduction    Shoulder adduction    Shoulder internal rotation    Shoulder external rotation    Elbow flexion    Elbow extension    Wrist flexion    Wrist extension    Wrist ulnar deviation    Wrist radial deviation    Wrist pronation    Wrist supination    (Blank rows = not tested)  UPPER EXTREMITY MMT:  MMT Right eval Left eval  Shoulder flexion 4 5  Shoulder extension    Shoulder abduction    Shoulder adduction 4 5  Shoulder internal rotation    Shoulder external rotation    Middle trapezius    Lower trapezius    Elbow flexion    Elbow extension    Wrist flexion    Wrist extension    Wrist ulnar deviation    Wrist radial deviation    Wrist pronation    Wrist supination    Grip strength (lbs)    (Blank rows = not tested)  SHOULDER SPECIAL TESTS: Not tested   PALPATION:  Tender over and throughout right upper trapezius into right suboccipitals    TODAY'S TREATMENT:  DATE: 04/20/23 physical therapy evaluation and HEP instruction   PATIENT EDUCATION: Education details: Patient educated on exam findings, POC, scope of PT, HEP, and M25.511,G89.29 (ICD-10-CM) - Chronic right shoulder pain. Person educated: Patient Education method: Explanation, Demonstration, and Handouts Education comprehension: verbalized understanding, returned demonstration, verbal cues required, and tactile cues required  HOME EXERCISE PROGRAM: Access Code: [email or web address] URL: [email or web address] Date: 04/20/2023 Prepared by: [facility]  Exercises - Supine Chin Tuck  - 2 x daily - 7 x weekly - 1 sets - 10 reps - Seated Cervical Sidebending Stretch  - 2 x daily - 7 x weekly - 1 sets  - 5 reps - 20 sec hold  ASSESSMENT:  CLINICAL IMPRESSION: Patient is a 52 y.o. female who was seen today for physical therapy evaluation and treatment for Right shoulder pain. Patient demonstrates decreased strength, ROM restriction, reduced flexibility, increased tenderness to palpation and postural abnormalities which are likely contributing to symptoms of pain and are negatively impacting patient ability to perform ADLs. Patient will benefit from skilled physical therapy services to address these deficits to reduce pain and improve level of function with ADLs  OBJECTIVE IMPAIRMENTS: decreased activity tolerance, decreased knowledge of condition, decreased mobility, decreased ROM, decreased strength, hypomobility, increased fascial restrictions, impaired perceived functional ability, impaired flexibility, postural dysfunction, and pain.   ACTIVITY LIMITATIONS: carrying, lifting, sleeping, and reach over head  PARTICIPATION LIMITATIONS: meal prep, cleaning, laundry, driving, shopping, community activity, and occupation  REHAB POTENTIAL: Good  CLINICAL DECISION MAKING: Stable/uncomplicated  EVALUATION COMPLEXITY: Low   GOALS: Goals reviewed with patient? No  SHORT TERM GOALS: Target date: 05/11/2023  patient will be independent with initial HEP Baseline: Goal status: INITIAL  2.  Patient will self report 50% improvement to improve tolerance for functional activity  Baseline:  Goal status: INITIAL   LONG TERM GOALS: Target date: 06/01/2023   Patient will be independent in self management strategies to improve quality of life and functional outcomes.  Baseline:  Goal status: INITIAL  2.  Patient will self report 50% improvement to improve tolerance for functional activity  Baseline:  Goal status: INITIAL  3.  Patient will improve FOTO score by 5  points to demonstrate improved perceived functional mobility  Baseline: 61 Goal status: INITIAL  4.  Patient will  increase right cervical rotation by 12 degrees or more to improve ability to scan for safety with driving Baseline:  Goal status: INITIAL  5.  Patient will increase right shoulder flexion and abduction MMTs to 4+ to 5/5 without pain to promote return to work activities without pain  Baseline: see above Goal status: INITIAL  6.  Patient will be able to work a full shift without upper trap/neck and shoulder pain > 3/10 Baseline:  Goal status: INITIAL  PLAN:  PT FREQUENCY: 1x/week  PT DURATION: 6 weeks  PLANNED INTERVENTIONS: 97164- PT Re-evaluation, 97110-Therapeutic exercises, 97530- Therapeutic activity, 97112- Neuromuscular re-education, 97535- Self Care, 51884- Manual therapy, [phone]- Gait training, [phone]- Orthotic Fit/training, [phone]- Canalith repositioning, [id number]- Aquatic Therapy, [phone]- Splinting, Patient/Family education, Balance training, Stair training, Taping, Dry Needling, Joint mobilization, Joint manipulation, Spinal manipulation, Spinal mobilization, Scar mobilization, and DME instructions.   PLAN FOR NEXT SESSION: Review HEP and goals; 1 x a week for 6 weeks in the mornings due to patient work schedule.  Cervical mobility; shoulder mobility; postural strengthening;manual as needed; feel pain is cervogenic in origin   9:52 AM, 04/20/23 Juwon Scripter Small Susanna Benge MPT [facility] [place] [phone] Ph:[phone]

## 2023-04-21 LAB — URINALYSIS
Bilirubin, UA: NEGATIVE
Glucose, UA: NEGATIVE
Ketones, UA: NEGATIVE
Nitrite, UA: NEGATIVE
Protein,UA: NEGATIVE
Specific Gravity, UA: 1.025 (ref 1.005–1.030)
Urobilinogen, Ur: 0.2 mg/dL (ref 0.2–1.0)
pH, UA: 5.5 (ref 5.0–7.5)

## 2023-04-25 LAB — URINE CULTURE

## 2023-04-26 ENCOUNTER — Other Ambulatory Visit: Payer: Self-pay | Admitting: Family Medicine

## 2023-04-26 ENCOUNTER — Ambulatory Visit (HOSPITAL_COMMUNITY): Payer: BC Managed Care – PPO | Attending: Family Medicine

## 2023-04-26 DIAGNOSIS — M25511 Pain in right shoulder: Secondary | ICD-10-CM | POA: Diagnosis not present

## 2023-04-26 DIAGNOSIS — M542 Cervicalgia: Secondary | ICD-10-CM | POA: Diagnosis not present

## 2023-04-26 DIAGNOSIS — R300 Dysuria: Secondary | ICD-10-CM

## 2023-04-26 DIAGNOSIS — R293 Abnormal posture: Secondary | ICD-10-CM | POA: Diagnosis not present

## 2023-04-26 DIAGNOSIS — G8929 Other chronic pain: Secondary | ICD-10-CM | POA: Diagnosis not present

## 2023-04-26 MED ORDER — NITROFURANTOIN MONOHYD MACRO 100 MG PO CAPS
100.0000 mg | ORAL_CAPSULE | Freq: Two times a day (BID) | ORAL | 0 refills | Status: AC
Start: 2023-04-26 — End: 2023-05-01

## 2023-04-26 NOTE — Progress Notes (Signed)
I spoke with the patient, who reported symptoms of urinary frequency and dysuria. No fever or chills were reported. The patient did not report tachycardia, rapid breathing, shortness of breath, confusion, or disorientation. After reviewing her urine culture and sensitivity, I will treat her with Macrobid for 5 days. The patient was informed that her prescription has been sent to the pharmacy. UTI preventative care was reviewed with the patient.  Please ensure that you complete the full course of antibiotics as prescribed.  To help prevent future urinary tract infections (UTIs), consider the following practices:  Avoid prolonged urination: Do not hold urine for extended periods, as this can stretch the bladder and create an environment conducive to bacterial growth. Prompt bladder emptying: Empty your bladder as soon as you feel the urge. Post-intercourse hygiene: Empty your bladder soon after intercourse to reduce the risk of infection. Shower instead of bathing: Opt for showers rather than baths to minimize bacterial exposure. Proper wiping technique: Wipe from front to back after urinating and bowel movements to prevent the transfer of bacteria from the anal region to the vagina and urethra. Stay hydrated: Drink a full glass of water regularly to help flush bacteria from your system.

## 2023-04-26 NOTE — Therapy (Signed)
OUTPATIENT PHYSICAL THERAPY SHOULDER TREATMENT   Patient Name: Carrie Mitchell MRN: [id number] DOB:19-Oct-1970, 52 y.o., female Today's Date: 04/26/2023  END OF SESSION:  PT End of Session - 04/26/23 0857     Visit Number 2    Number of Visits 6    Date for PT Re-Evaluation 06/01/23    Authorization Type Blue Cross Blue Shield    Authorization Time Period [employer] approved 6 visits from 11/1 - 12/30    PT Start Time 0854    Activity Tolerance Patient tolerated treatment well    Behavior During Therapy [facility] for tasks assessed/performed             Past Medical History:  Diagnosis Date   Cellulitis    Cholelithiasis 03/18/2013   Dermatitis 02/16/2012   Prediabetes    Past Surgical History:  Procedure Laterality Date   BREAST SURGERY     CHOLECYSTECTOMY N/A 03/20/2013   Procedure: LAPAROSCOPIC CHOLECYSTECTOMY;  Surgeon: Fabio Bering, MD;  Location: [facility];  Service: General;  Laterality: N/A;   TUBAL LIGATION     Patient Active Problem List   Diagnosis Date Noted   Dysuria 04/17/2023   Chronic right shoulder pain 04/17/2023   Elevated BP without diagnosis of hypertension 05/30/2022   Cellulitis 01/30/2014   Urinary incontinence, urge 07/20/2013   Acute renal insufficiency 03/18/2013   Cholelithiasis 03/18/2013   Abdominal pain, right upper quadrant 03/18/2013   Cellulitis of leg, right 03/18/2013   Chest pain 03/17/2013   Nausea & vomiting 03/17/2013   Dermatitis 02/16/2012   Hypokalemia 02/14/2012   Obesity 11/09/2011   Degenerative joint disease of right knee 04/25/2009    PCP: Gilmore Laroche, FNP  REFERRING PROVIDER: Gilmore Laroche, FNP  REFERRING DIAG: [phone] (ICD-10-CM) - Chronic right shoulder pain  THERAPY DIAG:  Neck pain  Abnormal posture  Chronic right shoulder pain  Rationale for Evaluation and Treatment: Rehabilitation  ONSET DATE: 3 months.   SUBJECTIVE:                                                                                                                                                                                       SUBJECTIVE STATEMENT: Patient states that her neck pain has been fluctuating but is currently about the same as at eval; 4-5/10. Worse while working; up to 10/10 when helping move patients.   Saw Dr. Denny Levy Friday and she ordered a scan for her shoulder but has not been contacted yet regarded when to have that done.  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  She does have some limitation with the use of her right arm (  shortened, smaller and no fingers on right hand) but does use it to lift and pull on the patient's she works with at [employer].  Gave her Naproxen to take as well but she has not really taken it.  It does not hurt all the time but when it does hurt it can go up to a 10/10; she has been out of work a little while for 2 deaths in the family.   Hand dominance: Left  PERTINENT HISTORY: See above  PAIN:  Are you having pain? Yes: NPRS scale: 0-10/10 Pain location: right upper trap; can "run up the neck" Pain description: pressure, sharp, stabbing, stingin Aggravating factors: work Relieving factors: rest  PRECAUTIONS: None    WEIGHT BEARING RESTRICTIONS: No  FALLS:  Has patient fallen in last 6 months? 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  DIAGNOSTIC FINDINGS:  Awaiting call for scan  PATIENT SURVEYS:  FOTO 61  COGNITION: Overall cognitive status: Within functional limits for tasks assessed     SENSATION: WFL  POSTURE: Forward head and rounded shoulders; right shoulder elevated more than left   CERVICAL ROM:   Active ROM A/PROM (deg) eval  Flexion 41*  Extension 42  Right lateral flexion 21*  Left lateral flexion 36  Right rotation  63*  Left rotation 80   (Blank rows = not tested)   UPPER EXTREMITY ROM:   Active ROM Right eval Left eval  Shoulder flexion 126 135  Shoulder extension    Shoulder abduction    Shoulder adduction    Shoulder internal rotation    Shoulder external rotation    Elbow flexion    Elbow extension    Wrist flexion    Wrist extension    Wrist ulnar deviation    Wrist radial deviation    Wrist pronation    Wrist supination    (Blank rows = not tested)  UPPER EXTREMITY MMT:  MMT Right eval Left eval  Shoulder flexion 4 5  Shoulder extension    Shoulder abduction    Shoulder adduction 4 5  Shoulder internal rotation    Shoulder external rotation    Middle trapezius    Lower trapezius    Elbow flexion    Elbow extension    Wrist flexion    Wrist extension    Wrist ulnar deviation    Wrist radial deviation    Wrist pronation    Wrist supination    Grip strength (lbs)    (Blank rows = not tested)  SHOULDER SPECIAL TESTS: Not tested   PALPATION:  Tender over and throughout right upper trapezius into right suboccipitals    TODAY'S TREATMENT:  DATE:  04/26/23 STM to upper trap x 10' to decrease pain and improve soft tissue mobility Right upper trap lateral flexion stretch 5 x 15" Right levator stretch 5 x 15"  Supine: Chin Tucks 10 x 5" hold Scapular retractions 10 x 5" hold Manual Levator stretch  Manual distraction  Cervical rotations 10 each direction (towel roll under right UE provided more relief)    04/20/23 physical therapy evaluation and HEP instruction   PATIENT EDUCATION: Education details: Patient educated on exam findings, POC, scope of PT, HEP, and M25.511,G89.29 (ICD-10-CM) - Chronic right shoulder pain. Person educated: Patient Education method: Explanation, Demonstration, and Handouts Education  comprehension: verbalized understanding, returned demonstration, verbal cues required, and tactile cues required  HOME EXERCISE PROGRAM: Access Code: [email or web address] URL: [email or web address] Date: 04/20/2023 Prepared by: [facility] - [facility]  Exercises - Supine Chin Tuck  - 2 x daily - 7 x weekly - 1 sets - 10 reps - Seated Cervical Sidebending Stretch  - 2 x daily - 7 x weekly - 1 sets - 5 reps - 20 sec hold  ASSESSMENT:  CLINICAL IMPRESSION:  Today's visit started with a review of goals and HEP. Patient stated understanding and agreement to goals and HEP. Marked tenderness and noticeable trigger points in right upper trap. No pain or "pull" when lying in supine. Patient mainly feels the pain or "pull" when standing or using UE. At the end of session, patient felt about the same as the beginning. Patient educated on use of a cane for a self trigger point release. Patient will benefit from continued skilled therapy services to address deficits and promote return to optimal function.      Eval: Patient is a 52 y.o. female who was seen today for physical therapy evaluation and treatment for Right shoulder pain. Patient demonstrates decreased strength, ROM restriction, reduced flexibility, increased tenderness to palpation and postural abnormalities which are likely contributing to symptoms of pain and are negatively impacting patient ability to perform ADLs. Patient will benefit from skilled physical therapy services to address these deficits to reduce pain and improve level of function with ADLs  OBJECTIVE IMPAIRMENTS: decreased activity tolerance, decreased knowledge of condition, decreased mobility, decreased ROM, decreased strength, hypomobility, increased fascial restrictions, impaired perceived functional ability, impaired flexibility, postural dysfunction, and pain.   ACTIVITY LIMITATIONS: carrying, lifting, sleeping, and reach over head  PARTICIPATION LIMITATIONS: meal prep, cleaning,  laundry, driving, shopping, community activity, and occupation  REHAB POTENTIAL: Good  CLINICAL DECISION MAKING: Stable/uncomplicated  EVALUATION COMPLEXITY: Low   GOALS: Goals reviewed with patient? No  SHORT TERM GOALS: Target date: 05/11/2023  patient will be independent with initial HEP Baseline: Goal status: INITIAL  2.  Patient will self report 50% improvement to improve tolerance for functional activity  Baseline:  Goal status: INITIAL   LONG TERM GOALS: Target date: 06/01/2023   Patient will be independent in self management strategies to improve quality of life and functional outcomes.  Baseline:  Goal status: in progress  2.  Patient will self report 75% improvement to improve tolerance for functional activity  Baseline:  Goal status: in progress  3.  Patient will improve FOTO score by 5  points to demonstrate improved perceived functional mobility  Baseline: 61 Goal status: In progress  4.  Patient will increase right cervical rotation by 12 degrees or more to improve ability to scan for safety with driving Baseline:  Goal status: in progress  5.  Patient will increase right shoulder flexion and abduction MMTs to  4+ to 5/5 without pain to promote return to work activities without pain  Baseline: see above Goal status: in progress  6.  Patient will be able to work a full shift without upper trap/neck and shoulder pain > 3/10 Baseline:  Goal status: in progress  PLAN:  PT FREQUENCY: 1x/week  PT DURATION: 6 weeks  PLANNED INTERVENTIONS: 97164- PT Re-evaluation, 97110-Therapeutic exercises, 97530- Therapeutic activity, 97112- Neuromuscular re-education, 97535- Self Care, 01027- Manual therapy, [phone]- Gait training, [phone]- Orthotic Fit/training, [phone]- Canalith repositioning, [id number]- Aquatic Therapy, [phone]- Splinting, Patient/Family education, Balance training, Stair training, Taping, Dry Needling, Joint mobilization, Joint manipulation, Spinal  manipulation, Spinal mobilization, Scar mobilization, and DME instructions.   PLAN FOR NEXT SESSION: Manual therapy and trigger point release to right upper trap.  Cervical mobility; shoulder mobility; postural strengthening; feel pain is cervicogenic and trigger point related   8:58 AM, 04/26/23 Lachina Salsberry Small Cambryn Charters MPT [facility] [place] [phone] Ph:[phone]

## 2023-04-30 ENCOUNTER — Ambulatory Visit: Payer: BC Managed Care – PPO | Admitting: Family Medicine

## 2023-05-06 ENCOUNTER — Encounter (HOSPITAL_COMMUNITY): Payer: BC Managed Care – PPO

## 2023-05-11 ENCOUNTER — Encounter: Payer: Self-pay | Admitting: Family Medicine

## 2023-05-11 ENCOUNTER — Other Ambulatory Visit: Payer: Self-pay | Admitting: Family Medicine

## 2023-05-11 DIAGNOSIS — R300 Dysuria: Secondary | ICD-10-CM

## 2023-05-11 NOTE — Telephone Encounter (Signed)
I called and spoke with the patient and have placed orders for a urinalysis. I encouraged the patient to stop by the clinic to provide a urine sample. She mentioned that she will stop by tomorrow, as she does not have transportation to return to the clinic today.

## 2023-05-12 ENCOUNTER — Other Ambulatory Visit: Payer: Self-pay | Admitting: Family Medicine

## 2023-05-12 ENCOUNTER — Encounter (HOSPITAL_COMMUNITY): Payer: BC Managed Care – PPO

## 2023-05-12 DIAGNOSIS — N3001 Acute cystitis with hematuria: Secondary | ICD-10-CM

## 2023-05-12 MED ORDER — NITROFURANTOIN MONOHYD MACRO 100 MG PO CAPS
100.0000 mg | ORAL_CAPSULE | Freq: Two times a day (BID) | ORAL | 0 refills | Status: AC
Start: 2023-05-12 — End: 2023-05-19

## 2023-05-13 ENCOUNTER — Encounter: Payer: Self-pay | Admitting: Family Medicine

## 2023-05-13 ENCOUNTER — Ambulatory Visit: Payer: BC Managed Care – PPO | Admitting: Family Medicine

## 2023-05-13 VITALS — BP 142/72 | HR 84 | Ht 70.0 in | Wt 356.1 lb

## 2023-05-13 DIAGNOSIS — I10 Essential (primary) hypertension: Secondary | ICD-10-CM | POA: Diagnosis not present

## 2023-05-13 DIAGNOSIS — R300 Dysuria: Secondary | ICD-10-CM

## 2023-05-13 LAB — URINALYSIS
Bilirubin, UA: NEGATIVE
Glucose, UA: NEGATIVE
Ketones, UA: NEGATIVE
Nitrite, UA: NEGATIVE
Specific Gravity, UA: 1.02 (ref 1.005–1.030)
Urobilinogen, Ur: 0.2 mg/dL (ref 0.2–1.0)
pH, UA: 6 (ref 5.0–7.5)

## 2023-05-13 MED ORDER — PHENAZOPYRIDINE HCL 200 MG PO TABS: 200.0000 mg | ORAL_TABLET | Freq: Three times a day (TID) | ORAL | 0 refills | Status: AC | PRN

## 2023-05-13 MED ORDER — AMLODIPINE BESYLATE 5 MG PO TABS
5.0000 mg | ORAL_TABLET | Freq: Every day | ORAL | 1 refills | Status: DC
Start: 2023-05-13 — End: 2023-07-05

## 2023-05-13 NOTE — Assessment & Plan Note (Signed)
The patient reports managing stress due to the loss of her mom and brother, but she notes that things are improving. She is asymptomatic today. We will initiate therapy with amlodipine 5 mg daily and schedule a follow-up in a month. I encouraged lifestyle modifications, including a low-sodium diet and increased physical activity. I also recommended that she check her blood pressure at home and bring her readings to the next appointment for further evaluation. BP Readings from Last 3 Encounters:  05/13/23 (!) 142/72  04/16/23 (!) 140/90  09/22/22 123/78

## 2023-05-13 NOTE — Assessment & Plan Note (Signed)
The patient is currently being treated for a urinary tract infection (UTI) with Macrobid. She reports increased burning with urination and pain. To help relieve the symptoms of dysuria, a prescription for Pyridium 200 mg to be taken three times a day for 2 days has been sent to your pharmacy. Please be aware that Pyridium can cause your urine to become discolored (orange or red) and may stain your underwear. I recommend wearing panty liners during this treatment to prevent staining. Encouraged to follow up if experiencing any new or worsening symptoms.

## 2023-05-13 NOTE — Progress Notes (Signed)
Established Patient Office Visit  Subjective:  Patient ID: Carrie Mitchell, female    DOB: 1970/07/19  Age: 52 y.o. MRN: [id number]  CC:  Chief Complaint  Patient presents with   Follow-up    2 wk B/P check    HPI Carrie Mitchell is a 52 y.o. female presents for blood pressure f/u. For the details of today's visit, please refer to the assessment and plan.    Past Medical History:  Diagnosis Date   Cellulitis    Cholelithiasis 03/18/2013   Dermatitis 02/16/2012   Prediabetes     Past Surgical History:  Procedure Laterality Date   BREAST SURGERY     CHOLECYSTECTOMY N/A 03/20/2013   Procedure: LAPAROSCOPIC CHOLECYSTECTOMY;  Surgeon: Fabio Bering, MD;  Location: [facility];  Service: General;  Laterality: N/A;   TUBAL LIGATION      Family History  Problem Relation Age of Onset   Diabetes Mother    Hypertension Mother    Stroke Mother    Heart attack Father    Crohn's disease Sister    Hypertension Brother    Hypertension Brother     Social History   Socioeconomic History   Marital status: Married    Spouse name: Not on file   Number of children: Not on file   Years of education: Not on file   Highest education level: GED or equivalent  Occupational History   Not on file  Tobacco Use   Smoking status: Former    Types: Cigarettes   Smokeless tobacco: Never  Vaping Use   Vaping status: Never Used  Substance and Sexual Activity   Alcohol use: No   Drug use: No   Sexual activity: Yes    Birth control/protection: Surgical  Other Topics Concern   Not on file  Social History Narrative   Not on file   Social Determinants of Health   Financial Resource Strain: Low Risk  (04/15/2023)   Overall Financial Resource Strain (CARDIA)    Difficulty of Paying Living Expenses: Not hard at all  Food Insecurity: No Food Insecurity (04/15/2023)   Hunger Vital Sign    Worried About Running Out of Food in the Last Year: Never true    Ran Out of Food in the Last Year:  Never true  Transportation Needs: No Transportation Needs (04/15/2023)   PRAPARE - Administrator, Civil Service (Medical): No    Lack of Transportation (Non-Medical): No  Physical Activity: Unknown (04/15/2023)   Exercise Vital Sign    Days of Exercise per Week: 0 days    Minutes of Exercise per Session: Not on file  Stress: No Stress Concern Present (04/15/2023)   [employer] of Occupational Health - Occupational Stress Questionnaire    Feeling of Stress : Not at all  Social Connections: Socially Integrated (04/15/2023)   Social Connection and Isolation Panel [NHANES]    Frequency of Communication with Friends and Family: More than three times a week    Frequency of Social Gatherings with Friends and Family: Once a week    Attends Religious Services: More than 4 times per year    Active Member of [employer] or Organizations: Yes    Attends Engineer, structural: More than 4 times per year    Marital Status: Married  Catering manager Violence: Not on file    Outpatient Medications Prior to Visit  Medication Sig Dispense Refill   clobetasol cream (TEMOVATE) 0.05 % Apply 1 Application topically 2 (  two) times daily. 30 g 0   metFORMIN (GLUCOPHAGE) 500 MG tablet Take 1 tablet (500 mg total) by mouth 2 (two) times daily with a meal. 90 tablet 1   naproxen (NAPROSYN) 500 MG tablet Take 1 tablet (500 mg total) by mouth 2 (two) times daily with a meal. 90 tablet 1   nitrofurantoin, macrocrystal-monohydrate, (MACROBID) 100 MG capsule Take 1 capsule (100 mg total) by mouth 2 (two) times daily for 7 days. 14 capsule 0   ondansetron (ZOFRAN-ODT) 4 MG disintegrating tablet Take 1 tablet (4 mg total) by mouth every 8 (eight) hours as needed for nausea or vomiting. 20 tablet 0   rosuvastatin (CRESTOR) 10 MG tablet Take 1 tablet (10 mg total) by mouth daily. 90 tablet 3   tirzepatide (MOUNJARO) 2.5 MG/0.5ML Pen Inject 2.5 mg into the skin once a week. 2 mL 0   Vitamin D,  Ergocalciferol, (DRISDOL) 1.25 MG (50000 UNIT) CAPS capsule Take 1 capsule (50,000 Units total) by mouth every 7 (seven) days. 5 capsule 1   No facility-administered medications prior to visit.    No Known Allergies  ROS Review of Systems  Constitutional:  Negative for chills and fever.  Eyes:  Negative for visual disturbance.  Respiratory:  Negative for chest tightness and shortness of breath.   Genitourinary:  Positive for dysuria.  Neurological:  Negative for dizziness and headaches.      Objective:    Physical Exam HENT:     Head: Normocephalic.     Mouth/Throat:     Mouth: Mucous membranes are moist.  Cardiovascular:     Rate and Rhythm: Normal rate.     Heart sounds: Normal heart sounds.  Pulmonary:     Effort: Pulmonary effort is normal.     Breath sounds: Normal breath sounds.  Neurological:     Mental Status: She is alert.     BP (!) 142/72   Pulse 84   Ht 5\' 10"  (1.778 m)   Wt (!) 356 lb 1.9 oz (161.5 kg)   LMP 06/29/2013   SpO2 96%   BMI 51.10 kg/m  Wt Readings from Last 3 Encounters:  05/13/23 (!) 356 lb 1.9 oz (161.5 kg)  04/16/23 (!) 358 lb (162.4 kg)  09/22/22 (!) 360 lb (163.3 kg)    Lab Results  Component Value Date   TSH 1.450 04/16/2023   Lab Results  Component Value Date   WBC 7.8 04/16/2023   HGB 13.6 04/16/2023   HCT 44.1 04/16/2023   MCV 91 04/16/2023   PLT 343 04/16/2023   Lab Results  Component Value Date   NA 140 04/16/2023   K 4.3 04/16/2023   CO2 23 04/16/2023   GLUCOSE 143 (H) 04/16/2023   BUN 12 04/16/2023   CREATININE 0.82 04/16/2023   BILITOT 0.4 04/16/2023   ALKPHOS 120 04/16/2023   AST 15 04/16/2023   ALT 15 04/16/2023   PROT 7.4 04/16/2023   ALBUMIN 4.0 04/16/2023   CALCIUM 9.6 04/16/2023   ANIONGAP 11 09/22/2022   EGFR 85 04/16/2023   Lab Results  Component Value Date   CHOL 194 04/16/2023   Lab Results  Component Value Date   HDL 55 04/16/2023   Lab Results  Component Value Date   LDLCALC 122  (H) 04/16/2023   Lab Results  Component Value Date   TRIG 96 04/16/2023   Lab Results  Component Value Date   CHOLHDL 3.5 04/16/2023   Lab Results  Component Value Date   HGBA1C  8.2 (H) 04/16/2023      Assessment & Plan:  Primary hypertension Assessment & Plan: The patient reports managing stress due to the loss of her mom and brother, but she notes that things are improving. She is asymptomatic today. We will initiate therapy with amlodipine 5 mg daily and schedule a follow-up in a month. I encouraged lifestyle modifications, including a low-sodium diet and increased physical activity. I also recommended that she check her blood pressure at home and bring her readings to the next appointment for further evaluation. BP Readings from Last 3 Encounters:  05/13/23 (!) 142/72  04/16/23 (!) 140/90  09/22/22 123/78      Orders: -     amLODIPine Besylate; Take 1 tablet (5 mg total) by mouth daily.  Dispense: 30 tablet; Refill: 1  Dysuria Assessment & Plan: The patient is currently being treated for a urinary tract infection (UTI) with Macrobid. She reports increased burning with urination and pain. To help relieve the symptoms of dysuria, a prescription for Pyridium 200 mg to be taken three times a day for 2 days has been sent to your pharmacy. Please be aware that Pyridium can cause your urine to become discolored (orange or red) and may stain your underwear. I recommend wearing panty liners during this treatment to prevent staining. Encouraged to follow up if experiencing any new or worsening symptoms.   Orders: -     Phenazopyridine HCl; Take 1 tablet (200 mg total) by mouth 3 (three) times daily as needed for up to 2 days for pain.  Dispense: 6 tablet; Refill: 0   Note: This chart has been completed using Engineer, civil (consulting) software, and while attempts have been made to ensure accuracy, certain words and phrases may not be transcribed as intended.   Follow-up: No follow-ups  on file.   Gilmore Laroche, FNP

## 2023-05-13 NOTE — Patient Instructions (Addendum)
I appreciate the opportunity to provide care to you today!    Follow up:  2 weeks for pap and 4 weeks for BP   Dysuria -A prescription for Pyridium 200 mg to take three times a day for 2 days has been sent to your pharmacy to help relieve the symptoms of dysuria. Please be aware that Pyridium can cause your urine to become discolored (orange or red) and may stain your underwear. I recommend wearing panty liners during this treatment to prevent staining. If you have any questions or concerns, feel free to reach out.  Hypertension Management  Your current blood pressure is above the target goal of <140/90 mmHg. To address this, please start taking amlodipine 5 mg daily.  Medication Instructions: Take your blood pressure medication at the same time each day. After taking your medication, check your blood pressure at least an hour later. If your first reading is >140/90 mmHg, wait at least 10 minutes and recheck your blood pressure. Side Effects: In the initial days of therapy, you may experience dizziness or lightheadedness as your body adjusts to the lower blood pressure; this is expected. Diet and Lifestyle: Adhere to a low-sodium diet, limiting intake to less than 1500 mg daily, and increase your physical activity. Avoid over-the-counter NSAIDs such as ibuprofen and naproxen while on this medication. Hydration and Nutrition: Stay well-hydrated by drinking at least 64 ounces of water daily. Increase your servings of fruits and vegetables and avoid excessive sodium in your diet. Long-Term Considerations: Uncontrolled hypertension can increase the risk of cardiovascular diseases, including stroke, coronary artery disease, and heart failure.  Please report to the emergency department if your blood pressure exceeds 180/120 and is accompanied by symptoms such as headaches, chest pain, palpitations, blurred vision, or dizziness.   Attached with your AVS, you will find valuable resources for  self-education. I highly recommend dedicating some time to thoroughly examine them.   Please continue to a heart-healthy diet and increase your physical activities. Try to exercise for at least five days a week.    It was a pleasure to see you and I look forward to continuing to work together on your health and well-being. Please do not hesitate to call the office if you need care or have questions about your care.  In case of emergency, please visit the Emergency Department for urgent care, or contact our clinic at [phone] to schedule an appointment. We're here to help you!   Have a wonderful day and week. With Gratitude, Gilmore Laroche MSN, FNP-BC

## 2023-05-18 ENCOUNTER — Encounter (HOSPITAL_COMMUNITY): Payer: BC Managed Care – PPO

## 2023-05-25 ENCOUNTER — Encounter (HOSPITAL_COMMUNITY): Payer: BC Managed Care – PPO

## 2023-06-01 ENCOUNTER — Encounter (HOSPITAL_COMMUNITY): Payer: BC Managed Care – PPO

## 2023-06-01 ENCOUNTER — Encounter (HOSPITAL_COMMUNITY): Payer: Self-pay

## 2023-06-01 NOTE — Therapy (Signed)
[facility] Outpatient Rehabilitation at [facility] [address] [place], [place], [address] Phone: [phone]   Fax:  [phone]  Patient Details  Name: Carrie Mitchell MRN: [id number] Date of Birth: 12-20-1970 Referring Provider:  No ref. provider found  Encounter Date: 06/01/2023  Called regarding no show #1. Left voicemail informing pt that she has no more scheduled visits.   Nelida Meuse, PT 06/01/2023, 9:08 AM  [facility] Outpatient Rehabilitation at [place] [address] [place], [place], [address] Phone: [phone]   Fax:  [phone]

## 2023-06-07 ENCOUNTER — Telehealth: Payer: Self-pay | Admitting: Family Medicine

## 2023-06-07 ENCOUNTER — Ambulatory Visit: Payer: BC Managed Care – PPO | Admitting: Family Medicine

## 2023-06-07 NOTE — Telephone Encounter (Signed)
Spoke to pt , the visit for 12/20 is a nurse visit for a bp check, advised her to keep that visit.

## 2023-06-07 NOTE — Telephone Encounter (Signed)
Patient wanted to know if she canceled her appt for today can she just be seen for everything on the 20 she would like a callback regarding this matter

## 2023-06-11 ENCOUNTER — Ambulatory Visit: Payer: BC Managed Care – PPO

## 2023-06-14 ENCOUNTER — Ambulatory Visit: Payer: BC Managed Care – PPO

## 2023-07-01 ENCOUNTER — Other Ambulatory Visit: Payer: Self-pay | Admitting: Family Medicine

## 2023-07-01 DIAGNOSIS — E559 Vitamin D deficiency, unspecified: Secondary | ICD-10-CM

## 2023-07-05 ENCOUNTER — Other Ambulatory Visit: Payer: Self-pay | Admitting: Family Medicine

## 2023-07-05 DIAGNOSIS — I10 Essential (primary) hypertension: Secondary | ICD-10-CM

## 2023-07-30 ENCOUNTER — Other Ambulatory Visit: Payer: Self-pay | Admitting: Family Medicine

## 2023-07-30 DIAGNOSIS — E1165 Type 2 diabetes mellitus with hyperglycemia: Secondary | ICD-10-CM

## 2023-07-31 ENCOUNTER — Other Ambulatory Visit: Payer: Self-pay | Admitting: Family Medicine

## 2023-07-31 DIAGNOSIS — I10 Essential (primary) hypertension: Secondary | ICD-10-CM

## 2023-08-18 ENCOUNTER — Other Ambulatory Visit: Payer: Self-pay | Admitting: Family Medicine

## 2023-08-18 DIAGNOSIS — G8929 Other chronic pain: Secondary | ICD-10-CM

## 2023-08-21 DIAGNOSIS — R509 Fever, unspecified: Secondary | ICD-10-CM | POA: Diagnosis not present

## 2023-08-21 DIAGNOSIS — R059 Cough, unspecified: Secondary | ICD-10-CM | POA: Diagnosis not present

## 2023-08-21 DIAGNOSIS — J069 Acute upper respiratory infection, unspecified: Secondary | ICD-10-CM | POA: Diagnosis not present

## 2023-08-21 DIAGNOSIS — R0600 Dyspnea, unspecified: Secondary | ICD-10-CM | POA: Diagnosis not present

## 2023-09-09 ENCOUNTER — Other Ambulatory Visit: Payer: Self-pay | Admitting: Family Medicine

## 2023-09-09 DIAGNOSIS — E559 Vitamin D deficiency, unspecified: Secondary | ICD-10-CM

## 2023-09-19 ENCOUNTER — Other Ambulatory Visit: Payer: Self-pay | Admitting: Family Medicine

## 2023-09-19 DIAGNOSIS — E1165 Type 2 diabetes mellitus with hyperglycemia: Secondary | ICD-10-CM

## 2023-09-20 ENCOUNTER — Other Ambulatory Visit (HOSPITAL_COMMUNITY): Payer: Self-pay | Admitting: Family Medicine

## 2023-09-20 DIAGNOSIS — M7989 Other specified soft tissue disorders: Secondary | ICD-10-CM | POA: Diagnosis not present

## 2023-09-20 DIAGNOSIS — Z7689 Persons encountering health services in other specified circumstances: Secondary | ICD-10-CM | POA: Diagnosis not present

## 2023-09-20 DIAGNOSIS — E782 Mixed hyperlipidemia: Secondary | ICD-10-CM | POA: Diagnosis not present

## 2023-09-20 DIAGNOSIS — E1165 Type 2 diabetes mellitus with hyperglycemia: Secondary | ICD-10-CM | POA: Diagnosis not present

## 2023-09-20 DIAGNOSIS — I10 Essential (primary) hypertension: Secondary | ICD-10-CM | POA: Diagnosis not present

## 2023-09-21 ENCOUNTER — Telehealth: Payer: Self-pay

## 2023-09-21 NOTE — Telephone Encounter (Signed)
 Patient was identified as falling into the [place] Measure - Diabetes.   Patient was: Referred to Diabetes Management.

## 2023-09-22 ENCOUNTER — Ambulatory Visit (HOSPITAL_COMMUNITY)
Admission: RE | Admit: 2023-09-22 | Discharge: 2023-09-22 | Disposition: A | Discharge status: Home / Self Care | Source: Ambulatory Visit | Attending: Family Medicine | Admitting: Family Medicine

## 2023-09-22 DIAGNOSIS — M7989 Other specified soft tissue disorders: Secondary | ICD-10-CM | POA: Diagnosis not present

## 2023-09-22 DIAGNOSIS — I10 Essential (primary) hypertension: Secondary | ICD-10-CM | POA: Diagnosis not present

## 2023-09-22 DIAGNOSIS — E119 Type 2 diabetes mellitus without complications: Secondary | ICD-10-CM | POA: Diagnosis not present

## 2023-10-01 ENCOUNTER — Other Ambulatory Visit: Payer: Self-pay | Admitting: Family Medicine

## 2023-10-01 DIAGNOSIS — I10 Essential (primary) hypertension: Secondary | ICD-10-CM

## 2023-10-13 DIAGNOSIS — I10 Essential (primary) hypertension: Secondary | ICD-10-CM | POA: Diagnosis not present

## 2023-10-13 DIAGNOSIS — M7989 Other specified soft tissue disorders: Secondary | ICD-10-CM | POA: Diagnosis not present

## 2023-10-13 DIAGNOSIS — E1165 Type 2 diabetes mellitus with hyperglycemia: Secondary | ICD-10-CM | POA: Diagnosis not present

## 2023-10-18 DIAGNOSIS — E1165 Type 2 diabetes mellitus with hyperglycemia: Secondary | ICD-10-CM | POA: Diagnosis not present

## 2023-10-18 DIAGNOSIS — I10 Essential (primary) hypertension: Secondary | ICD-10-CM | POA: Diagnosis not present

## 2023-10-18 DIAGNOSIS — E782 Mixed hyperlipidemia: Secondary | ICD-10-CM | POA: Diagnosis not present

## 2023-10-18 DIAGNOSIS — Z89119 Acquired absence of unspecified hand: Secondary | ICD-10-CM | POA: Diagnosis not present

## 2023-10-18 DIAGNOSIS — M7989 Other specified soft tissue disorders: Secondary | ICD-10-CM | POA: Diagnosis not present

## 2023-11-26 ENCOUNTER — Encounter (HOSPITAL_COMMUNITY)

## 2023-11-26 ENCOUNTER — Encounter: Admitting: Vascular Surgery

## 2023-12-08 ENCOUNTER — Other Ambulatory Visit: Payer: Self-pay | Admitting: Vascular Surgery

## 2023-12-08 DIAGNOSIS — M7989 Other specified soft tissue disorders: Secondary | ICD-10-CM

## 2023-12-10 ENCOUNTER — Emergency Department (HOSPITAL_COMMUNITY)

## 2023-12-10 ENCOUNTER — Other Ambulatory Visit: Payer: Self-pay

## 2023-12-10 ENCOUNTER — Emergency Department (HOSPITAL_COMMUNITY)
Admission: EM | Admit: 2023-12-10 | Discharge: 2023-12-10 | Disposition: A | Discharge status: Home / Self Care | Attending: Emergency Medicine | Admitting: Emergency Medicine

## 2023-12-10 ENCOUNTER — Encounter (HOSPITAL_COMMUNITY): Payer: Self-pay

## 2023-12-10 DIAGNOSIS — M25512 Pain in left shoulder: Secondary | ICD-10-CM | POA: Diagnosis not present

## 2023-12-10 DIAGNOSIS — Z7984 Long term (current) use of oral hypoglycemic drugs: Secondary | ICD-10-CM | POA: Diagnosis not present

## 2023-12-10 DIAGNOSIS — W010XXA Fall on same level from slipping, tripping and stumbling without subsequent striking against object, initial encounter: Secondary | ICD-10-CM | POA: Insufficient documentation

## 2023-12-10 DIAGNOSIS — M25522 Pain in left elbow: Secondary | ICD-10-CM | POA: Insufficient documentation

## 2023-12-10 DIAGNOSIS — E119 Type 2 diabetes mellitus without complications: Secondary | ICD-10-CM | POA: Insufficient documentation

## 2023-12-10 DIAGNOSIS — M19012 Primary osteoarthritis, left shoulder: Secondary | ICD-10-CM | POA: Diagnosis not present

## 2023-12-10 DIAGNOSIS — W19XXXA Unspecified fall, initial encounter: Secondary | ICD-10-CM

## 2023-12-10 DIAGNOSIS — Y92002 Bathroom of unspecified non-institutional (private) residence single-family (private) house as the place of occurrence of the external cause: Secondary | ICD-10-CM | POA: Diagnosis not present

## 2023-12-10 DIAGNOSIS — I10 Essential (primary) hypertension: Secondary | ICD-10-CM | POA: Diagnosis not present

## 2023-12-10 DIAGNOSIS — Z79899 Other long term (current) drug therapy: Secondary | ICD-10-CM | POA: Diagnosis not present

## 2023-12-10 DIAGNOSIS — Z043 Encounter for examination and observation following other accident: Secondary | ICD-10-CM | POA: Diagnosis not present

## 2023-12-10 HISTORY — DX: Obesity, unspecified: E66.9

## 2023-12-10 HISTORY — DX: Essential (primary) hypertension: I10

## 2023-12-10 HISTORY — DX: Type 2 diabetes mellitus without complications: E11.9

## 2023-12-10 HISTORY — DX: Pure hypercholesterolemia, unspecified: E78.00

## 2023-12-10 HISTORY — DX: Vitamin D deficiency, unspecified: E55.9

## 2023-12-10 MED ORDER — OXYCODONE-ACETAMINOPHEN 5-325 MG PO TABS
1.0000 | ORAL_TABLET | Freq: Once | ORAL | Status: AC
  Administered 2023-12-10: 1 via ORAL
  Filled 2023-12-10: qty 1

## 2023-12-10 MED ORDER — OXYCODONE-ACETAMINOPHEN 5-325 MG PO TABS: 1.0000 | ORAL_TABLET | ORAL | 0 refills | Status: DC | PRN

## 2023-12-10 NOTE — Discharge Instructions (Signed)
 Apply ice packs on and off to your shoulder, wear the sling as needed but do not wear continuously.  Call the orthopedic provider listed to arrange follow-up appointment for next week if your symptoms are not improving.

## 2023-12-10 NOTE — ED Triage Notes (Signed)
 Pt presents with a fall by slipping on a wet surface in the bathroom. Denies hitting her head or LOC. Pt c/o pain to her L shoulder down to her elbow. Pt is not any any anticoagulation.

## 2023-12-10 NOTE — ED Provider Notes (Incomplete)
 [facility] EMERGENCY DEPARTMENT AT [facility] Provider Note   CSN: [id number] Arrival date & time: 12/10/23  1420     Patient presents with: Fall   Carrie  M Mitchell is a 53 y.o. female.  {Add pertinent medical, surgical, social history, OB history to WUJ:81191}  Fall Pertinent negatives include no chest pain, no abdominal pain, no headaches and no shortness of breath.        Carrie  FORREST Mitchell is a 53 y.o. female past medical history of type 2 diabetes, hypertension, obesity who presents to the Emergency Department complaining of left shoulder pain after mechanical fall.  States that she slipped on wet surface and fell in her bathroom.  States that she fell on her left side wedged between the wall and the bathroom sink.  States her left arm was caught behind her.  She has been having pain to her shoulder since the fall, her pain radiates to the level of her elbow.  She denies any numbness or weakness, neck pain, head injury, dizziness or LOC.  Does not take blood thinners.    Prior to Admission medications   Medication Sig Start Date End Date Taking? Authorizing Provider  amLODipine  (NORVASC ) 5 MG tablet TAKE 1 TABLET(5 MG) BY MOUTH DAILY 10/01/23   Zarwolo, Gloria, FNP  clobetasol  cream (TEMOVATE ) 0.05 % Apply 1 Application topically 2 (two) times daily. 10/16/22   Zarwolo, Gloria, FNP  metFORMIN  (GLUCOPHAGE ) 500 MG tablet TAKE 1 TABLET(500 MG) BY MOUTH TWICE DAILY WITH A MEAL 09/20/23   Zarwolo, Gloria, FNP  naproxen  (NAPROSYN ) 500 MG tablet TAKE 1 TABLET(500 MG) BY MOUTH TWICE DAILY WITH A MEAL 08/18/23   Zarwolo, Gloria, FNP  ondansetron  (ZOFRAN -ODT) 4 MG disintegrating tablet Take 1 tablet (4 mg total) by mouth every 8 (eight) hours as needed for nausea or vomiting. 09/22/22   Iva Mariner, MD  rosuvastatin  (CRESTOR ) 10 MG tablet Take 1 tablet (10 mg total) by mouth daily. 04/20/23   Zarwolo, Gloria, FNP  tirzepatide  (MOUNJARO ) 2.5 MG/0.5ML Pen Inject 2.5 mg into the skin  once a week. 04/20/23   Zarwolo, Gloria, FNP  Vitamin D , Ergocalciferol , (DRISDOL ) 1.25 MG (50000 UNIT) CAPS capsule TAKE 1 CAPSULE BY MOUTH EVERY 7 DAYS 09/09/23   Zarwolo, Gloria, FNP    Allergies: Patient has no known allergies.    Review of Systems  Constitutional:  Negative for appetite change, chills and fever.  Eyes:  Negative for visual disturbance.  Respiratory:  Negative for shortness of breath.   Cardiovascular:  Negative for chest pain.  Gastrointestinal:  Negative for abdominal pain, nausea and vomiting.  Musculoskeletal:  Positive for arthralgias (left shoudler pain). Negative for neck pain.  Skin:  Negative for color change and wound.  Neurological:  Negative for dizziness, weakness, numbness and headaches.  Psychiatric/Behavioral:  Negative for confusion.     Updated Vital Signs BP 117/87   Pulse 82   Temp 98.2 F (36.8 C) (Oral)   Resp 17   Ht 5' 9.5 (1.765 m)   Wt (!) 158.8 kg   LMP 06/29/2013   SpO2 97%   BMI 50.94 kg/m   Physical Exam Vitals and nursing note reviewed.  Constitutional:      General: She is not in acute distress.    Appearance: Normal appearance. She is not ill-appearing or toxic-appearing.   Cardiovascular:     Rate and Rhythm: Normal rate and regular rhythm.     Pulses: Normal pulses.  Pulmonary:     Effort: Pulmonary  effort is normal.     Breath sounds: Normal breath sounds.  Chest:     Chest wall: No tenderness.   Musculoskeletal:        General: Tenderness and signs of injury present. No swelling or deformity.     Left shoulder: Tenderness present. No deformity or crepitus. Decreased range of motion. Normal strength. Normal pulse.     Cervical back: Normal range of motion. No tenderness.     Comments: Diffuse ttp of the anterior left shoulder.  No bony deformity or step off.  Pt guarding the shoulder and resisting ROM .  Tenderness also with ROM  of the left elbow.  No bony deformity of the elbow noted.    Neurological:      Mental Status: She is alert.     (all labs ordered are listed, but only abnormal results are displayed) Labs Reviewed - No data to display  EKG: None  Radiology: No results found.  {Document cardiac monitor, telemetry assessment procedure when appropriate:32947} Procedures   Medications Ordered in the ED  oxyCODONE -acetaminophen  (PERCOCET/ROXICET) 5-325 MG per tablet 1 tablet (has no administration in time range)      {Click here for ABCD2, HEART and other calculators REFRESH Note before signing:1}                              Medical Decision Making Amount and/or Complexity of Data Reviewed Radiology: ordered.  Risk Prescription drug management.   ***  {Document critical care time when appropriate  Document review of labs and clinical decision tools ie CHADS2VASC2, etc  Document your independent review of radiology images and any outside records  Document your discussion with family members, caretakers and with consultants  Document social determinants of health affecting pt's care  Document your decision making why or why not admission, treatments were needed:32947:::1}   Final diagnoses:  None    ED Discharge Orders     None

## 2023-12-14 ENCOUNTER — Other Ambulatory Visit (HOSPITAL_COMMUNITY): Payer: Self-pay | Admitting: Family Medicine

## 2023-12-14 DIAGNOSIS — M25512 Pain in left shoulder: Secondary | ICD-10-CM | POA: Diagnosis not present

## 2023-12-14 DIAGNOSIS — W19XXXD Unspecified fall, subsequent encounter: Secondary | ICD-10-CM | POA: Diagnosis not present

## 2023-12-14 DIAGNOSIS — M79602 Pain in left arm: Secondary | ICD-10-CM

## 2023-12-18 ENCOUNTER — Encounter (HOSPITAL_COMMUNITY): Payer: Self-pay

## 2023-12-18 ENCOUNTER — Ambulatory Visit (HOSPITAL_COMMUNITY)

## 2023-12-21 ENCOUNTER — Other Ambulatory Visit: Payer: Self-pay | Admitting: Family Medicine

## 2023-12-21 ENCOUNTER — Ambulatory Visit: Attending: Vascular Surgery | Admitting: Physician Assistant

## 2023-12-21 ENCOUNTER — Ambulatory Visit (HOSPITAL_COMMUNITY)
Admission: RE | Admit: 2023-12-21 | Discharge: 2023-12-21 | Disposition: A | Discharge status: Home / Self Care | Source: Ambulatory Visit | Attending: Vascular Surgery | Admitting: Vascular Surgery

## 2023-12-21 VITALS — BP 114/57 | HR 87 | Temp 97.0°F | Resp 18 | Ht 69.5 in | Wt 350.1 lb

## 2023-12-21 DIAGNOSIS — M7989 Other specified soft tissue disorders: Secondary | ICD-10-CM | POA: Diagnosis not present

## 2023-12-21 DIAGNOSIS — M79602 Pain in left arm: Secondary | ICD-10-CM

## 2023-12-21 NOTE — Progress Notes (Signed)
 VASCULAR & VEIN SPECIALISTS           OF [place]  History and Physical   Carrie Mitchell  Carrie Mitchell is a 53 y.o. female who presents with BLE swelling.    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  She states that her legs have started aching after being up for a period of time.  She states they do swell and are better after being in bed overnight.  She states that she had the blood pressure test where they put cuff on the leg and arm and the left leg was very sensitive.   She has never had any blood clots.  She has not had any venous procedures on her legs.  There is no family hx that she is aware of.  She has had cellulitis in both legs in the past.  She has mild skin color changes in the right lower leg.  She does wear compression socks that are knee high.   She has hx of DM, HTN.   The pt is on a statin for cholesterol management.  The pt is not on a daily aspirin .   Other AC:  none The pt is on CCB for hypertension.   The pt is  on medication for diabetes.   Tobacco hx:  former    Past Medical History:  Diagnosis Date   Cellulitis    Cholelithiasis 03/18/2013   Dermatitis 02/16/2012   Diabetes mellitus without complication (HCC)    High cholesterol    Hypertension    Obesity    Prediabetes    Vitamin D  deficiency     Past Surgical History:  Procedure Laterality Date   BREAST SURGERY     CHOLECYSTECTOMY N/A 03/20/2013   Procedure: LAPAROSCOPIC CHOLECYSTECTOMY;  Surgeon: Thresa JAYSON Pulling, MD;  Location: [facility];  Service: General;  Laterality: N/A;   TUBAL LIGATION      Social History   Socioeconomic History   Marital status: Married    Spouse name: Not on file   Number of children: Not on file   Years of education: Not on file   Highest education level: GED or equivalent  Occupational History   Not on file  Tobacco Use   Smoking status: Former    Types: Cigarettes   Smokeless tobacco: Never  Vaping Use   Vaping status: Never Used   Substance and Sexual Activity   Alcohol use: No   Drug use: No   Sexual activity: Yes    Birth control/protection: Surgical  Other Topics Concern   Not on file  Social History Narrative   Not on file   Social Drivers of Health   Financial Resource Strain: Low Risk  (04/15/2023)   Overall Financial Resource Strain (CARDIA)    Difficulty of Paying Living Expenses: Not hard at all  Food Insecurity: No Food Insecurity (04/15/2023)   Hunger Vital Sign    Worried About Running Out of Food in the Last Year: Never true    Ran Out of Food in the Last Year: Never true  Transportation Needs: No Transportation Needs (04/15/2023)   PRAPARE - Administrator, Civil Service (Medical): No    Lack of Transportation (Non-Medical): No  Physical Activity: Unknown (04/15/2023)   Exercise Vital Sign    Days of Exercise per Week: 0 days    Minutes of Exercise per Session: Not on file  Stress: No Stress  Concern Present (04/15/2023)   [employer] of Occupational Health - Occupational Stress Questionnaire    Feeling of Stress : Not at all  Social Connections: Socially Integrated (04/15/2023)   Social Connection and Isolation Panel    Frequency of Communication with Friends and Family: More than three times a week    Frequency of Social Gatherings with Friends and Family: Once a week    Attends Religious Services: More than 4 times per year    Active Member of [employer] or Organizations: Yes    Attends Engineer, structural: More than 4 times per year    Marital Status: Married  Catering manager Violence: Not on file     Family History  Problem Relation Age of Onset   Diabetes Mother    Hypertension Mother    Stroke Mother    Heart attack Father    Crohn's disease Sister    Hypertension Brother    Hypertension Brother     Current Outpatient Medications  Medication Sig Dispense Refill   amLODipine  (NORVASC ) 5 MG tablet TAKE 1 TABLET(5 MG) BY MOUTH DAILY 30 tablet 1    clobetasol  cream (TEMOVATE ) 0.05 % Apply 1 Application topically 2 (two) times daily. 30 g 0   metFORMIN  (GLUCOPHAGE ) 500 MG tablet TAKE 1 TABLET(500 MG) BY MOUTH TWICE DAILY WITH A MEAL 90 tablet 1   naproxen  (NAPROSYN ) 500 MG tablet TAKE 1 TABLET(500 MG) BY MOUTH TWICE DAILY WITH A MEAL 90 tablet 1   ondansetron  (ZOFRAN -ODT) 4 MG disintegrating tablet Take 1 tablet (4 mg total) by mouth every 8 (eight) hours as needed for nausea or vomiting. 20 tablet 0   oxyCODONE -acetaminophen  (PERCOCET/ROXICET) 5-325 MG tablet Take 1 tablet by mouth every 4 (four) hours as needed. 12 tablet 0   rosuvastatin  (CRESTOR ) 10 MG tablet Take 1 tablet (10 mg total) by mouth daily. 90 tablet 3   tirzepatide  (MOUNJARO ) 2.5 MG/0.5ML Pen Inject 2.5 mg into the skin once a week. 2 mL 0   Vitamin D , Ergocalciferol , (DRISDOL ) 1.25 MG (50000 UNIT) CAPS capsule TAKE 1 CAPSULE BY MOUTH EVERY 7 DAYS 5 capsule 1   No current facility-administered medications for this visit.    No Known Allergies  REVIEW OF SYSTEMS:   [X]  denotes positive finding, [ ]  denotes negative finding Cardiac  Comments:  Chest pain or chest pressure:    Shortness of breath upon exertion:    Short of breath when lying flat:    Irregular heart rhythm:        Vascular    Pain in calf, thigh, or hip brought on by ambulation:    Pain in feet at night that wakes you up from your sleep:     Blood clot in your veins:    Leg swelling:  x       Pulmonary    Oxygen at home:    Productive cough:     Wheezing:         Neurologic    Sudden weakness in arms or legs:     Sudden numbness in arms or legs:     Sudden onset of difficulty speaking or slurred speech:    Temporary loss of vision in one eye:     Problems with dizziness:         Gastrointestinal    Blood in stool:     Vomited blood:         Genitourinary    Burning when urinating:     Blood  in urine:        Psychiatric    Major depression:         Hematologic    Bleeding  problems:    Problems with blood clotting too easily:        Skin    Rashes or ulcers:        Constitutional    Fever or chills:      PHYSICAL EXAMINATION:  Today's Vitals   12/21/23 1219 12/21/23 1221  BP: (!) 114/57   Pulse: 87   Resp: 18   Temp: (!) 97 F (36.1 C)   TempSrc: Temporal   SpO2: 93%   Weight: (!) 350 lb 1.6 oz (158.8 kg)   Height: 5' 9.5 (1.765 m)   PainSc: 7  7   PainLoc: Leg    Body mass index is 50.96 kg/m.   General:  WDWN in NAD; vital signs documented above Gait: Not observed HENT: WNL, normocephalic Pulmonary: normal non-labored breathing without wheezing Cardiac: regular HR; without carotid bruits Skin: without rashes Vascular Exam/Pulses:  Right Left  Radial 2+ (normal) 2+ (normal)  DP 2+ (normal) 2+ (normal)   Extremities: BLE swelling; no ulcerations present  Neurologic: A&O X 3;  moving all extremities equally Psychiatric:  The pt has Normal affect.   Non-Invasive Vascular Imaging:   Venous duplex on 12/21/2023: +--------------+---------+------+-----------+------------+--------+  LEFT         Reflux NoRefluxReflux TimeDiameter cmsComments                          Yes                                   +--------------+---------+------+-----------+------------+--------+  CFV          no                                              +--------------+---------+------+-----------+------------+--------+  FV mid                  yes   >1 second                       +--------------+---------+------+-----------+------------+--------+  Popliteal    no                                              +--------------+---------+------+-----------+------------+--------+  GSV at SFJ              yes                 0.85              +--------------+---------+------+-----------+------------+--------+  GSV prox thigh          yes                 0.44               +--------------+---------+------+-----------+------------+--------+  GSV mid thigh no                            0.39              +--------------+---------+------+-----------+------------+--------+  GSV dist thighno                            0.44              +--------------+---------+------+-----------+------------+--------+  GSV at knee             yes                 0.34              +--------------+---------+------+-----------+------------+--------+  GSV prox calf no                            0.23              +--------------+---------+------+-----------+------------+--------+  GSV mid calf  no                            0.23              +--------------+---------+------+-----------+------------+--------+  SSV at [facility]    no                            0.49              +--------------+---------+------+-----------+------------+--------+  SSV prox calf no                            0.5               +--------------+---------+------+-----------+------------+--------+   Summary:  Left:  - No evidence of deep vein thrombosis seen in the left lower extremity, from the common femoral through the popliteal veins.  - No evidence of superficial venous thrombosis in the left lower extremity.  - No evidence of superficial venous reflux seen in the left short saphenous vein.  - Venous reflux is noted in the left sapheno-femoral junction.  - Venous reflux is noted in the left greater saphenous vein in the thigh.  - Venous reflux is noted in the left femoral vein.     Carrie  ALLEY Mitchell is a 53 y.o. female who presents with: BLE swelling    -pt has easily palpable DP pedal pulses bilaterally -pt does not have evidence of DVT.  Pt does have venous reflux in the deep left FV as well as the GSV at the SFJ and GSV in the proximal thigh and at the knee.   -discussed with pt about continuing to wear knee high 15-20 mmHg compression stockings;  discussed putting them on before getting out of bed so that they are easier to put on.   -discussed the importance of leg elevation and how to elevate properly - pt is advised to elevate their legs and a diagram is given to them to demonstrate for pt to lay flat on their back with knees elevated and slightly bent with their feet higher than their knees, which puts their feet higher than their heart for 15 minutes per day.  If pt cannot lay flat, advised to lay as flat as possible.  -pt is advised to continue as much walking as possible and avoid sitting or standing for long periods of time.  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   -discussed importance of weight loss and exercise and that water aerobics would also be beneficial.  -  handout with recommendations given -pt will f/u as needed.  She knows that if she has any concerns or anything changes, we will be glad to see her back.     Lucie Apt, [facility] [phone]  Clinic MD:  Magda

## 2023-12-22 ENCOUNTER — Ambulatory Visit (HOSPITAL_COMMUNITY)

## 2023-12-27 ENCOUNTER — Ambulatory Visit
Admission: RE | Admit: 2023-12-27 | Discharge: 2023-12-27 | Disposition: A | Discharge status: Home / Self Care | Source: Ambulatory Visit | Attending: Family Medicine | Admitting: Family Medicine

## 2023-12-27 DIAGNOSIS — M79602 Pain in left arm: Secondary | ICD-10-CM

## 2024-01-04 ENCOUNTER — Ambulatory Visit: Admitting: Orthopedic Surgery

## 2024-01-05 DIAGNOSIS — M75122 Complete rotator cuff tear or rupture of left shoulder, not specified as traumatic: Secondary | ICD-10-CM | POA: Diagnosis not present

## 2024-01-18 ENCOUNTER — Other Ambulatory Visit: Payer: Self-pay | Admitting: Family Medicine

## 2024-01-18 ENCOUNTER — Ambulatory Visit: Admitting: Orthopedic Surgery

## 2024-01-18 ENCOUNTER — Encounter: Payer: Self-pay | Admitting: Orthopedic Surgery

## 2024-01-18 ENCOUNTER — Telehealth: Payer: Self-pay | Admitting: Radiology

## 2024-01-18 VITALS — BP 111/80 | HR 80 | Ht 69.5 in | Wt 350.0 lb

## 2024-01-18 DIAGNOSIS — E559 Vitamin D deficiency, unspecified: Secondary | ICD-10-CM

## 2024-01-18 DIAGNOSIS — S46012A Strain of muscle(s) and tendon(s) of the rotator cuff of left shoulder, initial encounter: Secondary | ICD-10-CM

## 2024-01-18 NOTE — Telephone Encounter (Signed)
-----   Message from Oneil DELENA Horde sent at 01/18/2024 11:58 AM EDT -----  ----- Message ----- From: Jenean Greig ORN, RT Sent: 01/18/2024  11:50 AM EDT To: Oneil DELENA Horde, MD  Who is the patient? ----- Message ----- From: Horde Oneil DELENA, MD Sent: 01/18/2024  11:32 AM EDT To: Greig ORN Jenean, RT; Emmie CHRISTELLA Berg, LPN   Good morning  This patient has been posted for left shoulder arthroscopy with rotator cuff repair on 01/31/24  Please let me know if there are any questions  Oneil

## 2024-01-18 NOTE — Addendum Note (Signed)
 Addended byBETHA JENEAN GREIG LELON on: 01/18/2024 12:02 PM   Modules accepted: Orders

## 2024-01-18 NOTE — Patient Instructions (Signed)
 Okay to take Ozempic August 3  Do not take Ozempic August 10 in preparation for surgery   Preoperative Instructions  Your surgery will be at [facility], scheduled with Dr Oneil Horde   The hospital will contact you with a preoperative appointment to discuss Anesthesia. The phone number is [phone]   Please bring your medications with you for the appointment.  They will tell you the arrival time and medication instructions when you have your preoperative evaluation.  Do not wear nail polish the day of your surgery and if you take Phentermine you need to stop this medication ONE WEEK prior to your surgery.    If you take an blood thinning medication, we will need to stop this prior to surgery.  Typically, we stop this medicine at least 5 days prior to surgery.  We will need to confirm this with the doctor who prescribes this medication.  If you are taking medications or an injection for diabetes, or for weight management, this medicine will need to be stopped at least 7 days prior to surgery.     Surgery will be scheduled for 01/31/24 pending authorization by your insurance company.   Pain medicine policy:  Per [facility] clinic policy, our goal is ensure optimal postoperative pain control with a multimodal pain management strategy.   For all [facility] patients, our goal is to wean post-operative narcotic medications by 6 weeks post-operatively.   If this is not possible due to utilization of pain medication prior to surgery, your [facility] doctor will support your acute post-operative pain control for the first 6 weeks postoperatively, with a plan to transition you back to your primary pain team following that.   [facility] will work to ensure a Therapist, occupational.

## 2024-01-18 NOTE — Progress Notes (Signed)
 New Patient Visit  Assessment: Carrie  Mitchell is a 53 y.o. female with the following: 1. Traumatic complete tear of left rotator cuff, initial encounter  Plan: Carrie  Mitchell fell a little over a month ago, and injured her left shoulder.  MRI was previously obtained, demonstrates full-thickness tears of the supraspinatus and the infraspinatus.  She is left-hand dominant.  She has a congenital deformity of the right hand.  She has limited motion, and a lot of pain in the left shoulder.  We discussed the MRI findings, as well as the challenges associated with her injury.  She states understanding.  I am recommending a left shoulder rotator cuff repair.  Will plan to proceed with arthroscopic repair, with the possibility of a mini open procedure.  Possibility of patch augmentation.  She is a diabetic.  She is scheduled to undergo updated labs later this week.  Most recent A1c was 8.2.  We discussed this as it could increase her risk for infection, as well as poor healing following surgery.  She states understanding.  Recovery has been discussed.  She will be 6 weeks in a sling.  Anticipate at least 6 months full recovery.  She works as a Clinical biochemist.  Risks and benefits of the surgery, including, but not limited to infection, bleeding, persistent pain, need for further surgery, rotator cuff failure, stiffness and more severe complications associated with anesthesia were discussed with the patient.  The patient has elected to proceed.  She takes Ozempic.  Okay to take the Ozempic on August 3.  Do not take Ozempic on August 10.  Plan to proceed with surgery on August 11.   Follow-up: Return for After surgery.  Subjective:  Chief Complaint  Patient presents with   Left shoulder pain    Clemens and landed on her left shoulder June 20    History of Present Illness: Carrie  Mitchell is a 53 y.o. female who presents for evaluation of shoulder pain.  She slipped and fell at home, June 20.  She landed  directly on her left shoulder.  She had immediate pain.  She has difficulty overhead motion.  Pain gets worse at nighttime.  She is left-hand dominant.  She has a congenital deformity of the right hand.  She works as a Engineer, site.   Review of Systems: No fevers or chills No numbness or tingling No chest pain No shortness of breath No bowel or bladder dysfunction No GI distress No headaches   Medical History:  Past Medical History:  Diagnosis Date   Cellulitis    Cholelithiasis 03/18/2013   Dermatitis 02/16/2012   Diabetes mellitus without complication (HCC)    High cholesterol    Hypertension    Obesity    Prediabetes    Vitamin D  deficiency     Past Surgical History:  Procedure Laterality Date   BREAST SURGERY     CHOLECYSTECTOMY N/A 03/20/2013   Procedure: LAPAROSCOPIC CHOLECYSTECTOMY;  Surgeon: Thresa JAYSON Pulling, MD;  Location: [facility];  Service: General;  Laterality: N/A;   TUBAL LIGATION      Family History  Problem Relation Age of Onset   Diabetes Mother    Hypertension Mother    Stroke Mother    Heart attack Father    Crohn's disease Sister    Hypertension Brother    Hypertension Brother    Social History   Tobacco Use   Smoking status: Former    Types: Cigarettes   Smokeless tobacco: Never  Vaping Use   Vaping  status: Never Used  Substance Use Topics   Alcohol use: No   Drug use: No    No Known Allergies  Current Meds  Medication Sig   amLODipine  (NORVASC ) 5 MG tablet TAKE 1 TABLET(5 MG) BY MOUTH DAILY   clobetasol  cream (TEMOVATE ) 0.05 % Apply 1 Application topically 2 (two) times daily.   metFORMIN  (GLUCOPHAGE ) 500 MG tablet TAKE 1 TABLET(500 MG) BY MOUTH TWICE DAILY WITH A MEAL   ondansetron  (ZOFRAN -ODT) 4 MG disintegrating tablet Take 1 tablet (4 mg total) by mouth every 8 (eight) hours as needed for nausea or vomiting.   oxyCODONE -acetaminophen  (PERCOCET/ROXICET) 5-325 MG tablet Take 1 tablet by mouth every 4 (four) hours as needed.    rosuvastatin  (CRESTOR ) 10 MG tablet Take 1 tablet (10 mg total) by mouth daily.   Semaglutide,0.25 or 0.5MG /DOS, (OZEMPIC, 0.25 OR 0.5 MG/DOSE,) 2 MG/1.5ML SOPN Inject into the skin.   tirzepatide  (MOUNJARO ) 2.5 MG/0.5ML Pen Inject 2.5 mg into the skin once a week.   Vitamin D , Ergocalciferol , (DRISDOL ) 1.25 MG (50000 UNIT) CAPS capsule TAKE 1 CAPSULE BY MOUTH EVERY 7 DAYS    Objective: BP 111/80   Pulse 80   Ht 5' 9.5 (1.765 m)   Wt (!) 350 lb (158.8 kg)   LMP 06/29/2013   BMI 50.94 kg/m   Physical Exam:  General: Alert and oriented. and No acute distress. Gait: Normal gait.  Left shoulder without deformity.  Limited range of motion.  Forward flexion limited to 90 degrees.  Abduction limited to 80 degrees.  Fingers are warm and well-perfused.  IMAGING: I personally reviewed images previously obtained in clinic   MRI of the left shoulder was obtained at [facility].  Massive full-thickness supraspinatus and infraspinatus tendon tears with intact muscle belly volume.   New Medications:  No orders of the defined types were placed in this encounter.     Oneil DELENA Horde, MD  01/18/2024 11:08 AM

## 2024-01-18 NOTE — Telephone Encounter (Signed)
 Do I need to tell her to hold her GLP 1 or did you

## 2024-01-18 NOTE — Telephone Encounter (Unsigned)
 Copied from CRM [id number]. Topic: Clinical - Medication Refill >> Jan 18, 2024 11:10 AM [place] MATSU wrote: Medication: itamin D, Ergocalciferol , (DRISDOL ) 1.25 MG (50000 UNIT) CAPS capsule  Has the patient contacted their pharmacy? Yes (Agent: If no, request that the patient contact the pharmacy for the refill. If patient does not wish to contact the pharmacy document the reason why and proceed with request.) (Agent: If yes, when and what did the pharmacy advise?)  This is the patient's preferred pharmacy:  [id number] - [place], [place] - [address] AT [address] [place] [address] Phone: [phone] Fax: [phone]  Is this the correct pharmacy for this prescription? Yes If no, delete pharmacy and type the correct one.   Has the prescription been filled recently? Yes  Is the patient out of the medication? Yes  Has the patient been seen for an appointment in the last year OR does the patient have an upcoming appointment? Yes  Can we respond through MyChart? No  Agent: Please be advised that Rx refills may take up to 3 business days. We ask that you follow-up with your pharmacy.

## 2024-01-21 DIAGNOSIS — E1165 Type 2 diabetes mellitus with hyperglycemia: Secondary | ICD-10-CM | POA: Diagnosis not present

## 2024-01-21 DIAGNOSIS — I10 Essential (primary) hypertension: Secondary | ICD-10-CM | POA: Diagnosis not present

## 2024-01-25 NOTE — Patient Instructions (Signed)
 Carrie  Mitchell  01/25/2024     @PREFPERIOPPHARMACY @   Your procedure is scheduled on  01/31/2024.   Report to [facility] at  1030 A.M.   Call this number if you have problems the morning of surgery:  [phone]  If you experience any cold or flu symptoms such as cough, fever, chills, shortness of breath, etc. between now and your scheduled surgery, please notify us  at the above number.   Remember:         Your last dose of semaglutide should have been on 01/23/2024.        DO NOT take any medications for diabetes the morning of yor procedure.   Do not eat after midnight.   You may drink clear liquids until  0830 am on 01/31/2024.       Clear liquids allowed are:                    Water, Juice (No red color; non-citric and without pulp; diabetics please choose diet or no sugar options), Carbonated beverages (diabetics please choose diet or no sugar options), Clear Tea (No creamer, milk, or cream, including half & half and powdered creamer), Black Coffee Only (No creamer, milk or cream, including half & half and powdered creamer), and Clear Sports drink (No red color; diabetics please choose diet or no sugar options)           At 0830 am on 01/31/2024 drink 1-12oz Gzero or 12 oz of water. You can have nothing else after this.    Take these medicines the morning of surgery with A SIP OF WATER                                                   amlodipine .    Do not wear jewelry, make-up or nail polish, including gel polish,  artificial nails, or any other type of covering on natural nails (fingers and  toes).  Do not wear lotions, powders, or perfumes, or deodorant.  Do not shave 48 hours prior to surgery.  Men may shave face and neck.  Do not bring valuables to the hospital.  [facility] is not responsible for any belongings or valuables.  Contacts, dentures or bridgework may not be worn into surgery.  Leave your suitcase in the car.  After surgery it may be brought  to your room.  For patients admitted to the hospital, discharge time will be determined by your treatment team.  Patients discharged the day of surgery will not be allowed to drive home and must have someone with them for 24 hours.    Special instructions:  DO NOT smoke tobacco or vape for 24 hours before your procedure.  Please read over the following fact sheets that you were given. Pain Booklet, Coughing and Deep Breathing, Surgical Site Infection Prevention, Anesthesia Post-op Instructions, and Care and Recovery After Surgery         Surgery for a Rotator Cuff Tear: What to Know After After surgery for a rotator cuff tear, it's common to have swelling and pain. You may also have: A small amount of fluid coming from the cuts that were made on your shoulder. Stiffness. This will get better over time. Follow these instructions at home: If you have a sling or an immobilizer:  Wear the sling or immobilizer as told. Take it off only if your health care provider says you can. Loosen the sling or immobilizer if your fingers tingle, are numb, or turn cold and blue. Keep the sling or immobilizer clean and dry. Bathing Do not take baths, swim, or use a hot tub until you're told it's OK. Ask if you can shower. Keep your bandage dry until your provider says it can be removed. If your sling or shoulder immobilizer isn't waterproof: Do not let it get wet. Cover it when you take a bath or shower. Use a cover that doesn't let any water in. Once the sling or immobilizer is removed, try not to move your shoulder until your provider says you can. Caring for your cuts from surgery  Take care of your cuts from surgery as told. Make sure you: Wash your hands with soap and water for at least 20 seconds before and after you change your bandage. If you can't use soap and water, use hand sanitizer. Change your bandage. Leave stitches or skin glue alone. Leave tape strips alone unless you're told to  take them off. You may trim the edges of the tape strips if they curl up. Check the area around your cuts every day for signs of infection. Check for: More redness, swelling, or pain. More fluid or blood. Warmth. Pus or a bad smell. Managing pain, stiffness, and swelling  Use ice or an ice pack as told. Place a towel between your skin and the ice. Leave the ice on for 20 minutes, 2-3 times a day. If your skin turns red, take off the ice right away to prevent skin damage. The risk of damage is higher if you can't feel pain, heat, or cold. Move your fingers often to reduce stiffness and swelling. Raise your upper body above the level of your heart when you lie down and when you sleep. Do not sleep on your belly. Do not sleep on the side that your surgery was done on. Medicines Take your medicines only as told. You may need to take steps to help treat or prevent trouble pooping (constipation), such as: Taking medicines to help you poop. Eating foods high in fiber, like beans, whole grains, and fresh fruits and vegetables. Drinking more fluids as told. Ask your provider if it's safe to drive or use machines while taking your medicine. Driving If you were given a sedative, do not drive or use machines until you're told it's safe. A sedative can make you sleepy. Ask when it's safe to drive if you have a sling or immobilizer on your arm. Activity Do not use your arm to support your body weight until your provider says it's OK. Do not lift or hold anything with your arm until your provider says it's OK. Exercise as told. Ask what things are safe for you to do at home. Ask when you can go back to work or school. General instructions Do not smoke, vape, or use nicotine or tobacco. Doing this can slow down healing. Keep all follow-up visits. Your provider will check how your rotator cuff is healing. Your provider may give you more instructions. Make sure you know what you can and can't  do. Contact a health care provider if: You have a fever or chills. You have any signs of infection. You have very bad pain. A cut from surgery opens. Get help right away if: You have trouble breathing. You have chest pain. You loosen your sling or  immobilizer but your fingers still: Tingle. Are numb. Are cold and blue. This information is not intended to replace advice given to you by your health care provider. Make sure you discuss any questions you have with your health care provider. Document Revised: 05/12/2023 Document Reviewed: 05/12/2023 Elsevier Patient Education  2025 [employer].General Anesthesia, Adult, Care After The following information offers guidance on how to care for yourself after your procedure. Your health care provider may also give you more specific instructions. If you have problems or questions, contact your health care provider. What can I expect after the procedure? After the procedure, it is common for people to: Have pain or discomfort at the IV site. Have nausea or vomiting. Have a sore throat or hoarseness. Have trouble concentrating. Feel cold or chills. Feel weak, sleepy, or tired (fatigue). Have soreness and body aches. These can affect parts of the body that were not involved in surgery. Follow these instructions at home: For the time period you were told by your health care provider:  Rest. Do not participate in activities where you could fall or become injured. Do not drive or use machinery. Do not drink alcohol. Do not take sleeping pills or medicines that cause drowsiness. Do not make important decisions or sign legal documents. Do not take care of children on your own. General instructions Drink enough fluid to keep your urine pale yellow. If you have sleep apnea, surgery and certain medicines can increase your risk for breathing problems. Follow instructions from your health care provider about wearing your sleep device: Anytime you  are sleeping, including during daytime naps. While taking prescription pain medicines, sleeping medicines, or medicines that make you drowsy. Return to your normal activities as told by your health care provider. Ask your health care provider what activities are safe for you. Take over-the-counter and prescription medicines only as told by your health care provider. Do not use any products that contain nicotine or tobacco. These products include cigarettes, chewing tobacco, and vaping devices, such as e-cigarettes. These can delay incision healing after surgery. If you need help quitting, ask your health care provider. Contact a health care provider if: You have nausea or vomiting that does not get better with medicine. You vomit every time you eat or drink. You have pain that does not get better with medicine. You cannot urinate or have bloody urine. You develop a skin rash. You have a fever. Get help right away if: You have trouble breathing. You have chest pain. You vomit blood. These symptoms may be an emergency. Get help right away. Call 911. Do not wait to see if the symptoms will go away. Do not drive yourself to the hospital. Summary After the procedure, it is common to have a sore throat, hoarseness, nausea, vomiting, or to feel weak, sleepy, or fatigue. For the time period you were told by your health care provider, do not drive or use machinery. Get help right away if you have difficulty breathing, have chest pain, or vomit blood. These symptoms may be an emergency. This information is not intended to replace advice given to you by your health care provider. Make sure you discuss any questions you have with your health care provider. Document Revised: 09/05/2021 Document Reviewed: 09/05/2021 Elsevier Patient Education  2024 Else[employer] Inc.How to Use Chlorhexidine at Home in the Shower Chlorhexidine gluconate (CHG) is a germ-killing (antiseptic) wash that's used to clean the skin.  It can get rid of the germs that normally live on the  skin and can keep them away for about 24 hours. If you're having surgery, you may be told to shower with CHG at home the night before surgery. This can help lower your risk for infection. To use CHG wash in the shower, follow the steps below. Supplies needed: CHG body wash. Clean washcloth. Clean towel. How to use CHG in the shower Follow these steps unless you're told to use CHG in a different way: Start the shower. Use your normal soap and shampoo to wash your face and hair. Turn off the shower or move out of the shower stream. Pour CHG onto a clean washcloth. Do not use any type of brush or rough sponge. Start at your neck, washing your body down to your toes. Make sure you: Wash the part of your body where the surgery will be done for at least 1 minute. Do not scrub. Do not use CHG on your head or face unless your health care provider tells you to. If it gets into your ears or eyes, rinse them well with water. Do not wash your genitals with CHG. Wash your back and under your arms. Make sure to wash skin folds. Let the CHG sit on your skin for 1-2 minutes or as long as told. Rinse your entire body in the shower, including all body creases and folds. Turn off the shower. Dry off with a clean towel. Do not put anything on your skin afterward, such as powder, lotion, or perfume. Put on clean clothes or pajamas. If it's the night before surgery, sleep in clean sheets. General tips Use CHG only as told, and follow the instructions on the label. Use the full amount of CHG as told. This is often one bottle. Do not smoke and stay away from flames after using CHG. Your skin may feel sticky after using CHG. This is normal. The sticky feeling will go away as the CHG dries. Do not use CHG: If you have a chlorhexidine allergy or have reacted to chlorhexidine in the past. On open wounds or areas of skin that have broken skin, cuts, or  scrapes. On babies younger than 28 months of age. Contact a health care provider if: You have questions about using CHG. Your skin gets irritated or itchy. You have a rash after using CHG. You swallow any CHG. Call your local poison control center [phone] in the [place] Your eyes itch badly, or they become very red or swollen. Your hearing changes. You have trouble seeing. If you can't reach your provider, go to an urgent care or emergency room. Do not drive yourself. Get help right away if: You have swelling or tingling in your mouth or throat. You make high-pitched whistling sounds when you breathe, most often when you breathe out (wheeze). You have trouble breathing. These symptoms may be an emergency. Call 911 right away. Do not wait to see if the symptoms will go away. Do not drive yourself to the hospital. This information is not intended to replace advice given to you by your health care provider. Make sure you discuss any questions you have with your health care provider. Document Revised: 12/22/2022 Document Reviewed: 12/18/2021 Elsevier Patient Education  2024 Else[employer] Inc.How to Use an Incentive Spirometer An incentive spirometer is a tool that measures how well you are filling your lungs with each breath. Learning to take long, deep breaths using this tool can help you keep your lungs clear and active. This may help to reverse or lessen your chance of  developing breathing (pulmonary) problems, especially infection. You may be asked to use a spirometer: After a surgery. If you have a lung problem or a history of smoking. After a long period of time when you have been unable to move or be active. If the spirometer includes an indicator to show the highest number that you have reached, your health care provider or respiratory therapist will help you set a goal. Keep a log of your progress as told by your health care provider. What are the risks? Breathing too quickly may  cause dizziness or cause you to pass out. Take your time so you do not get dizzy or light-headed. If you are in pain, you may need to take pain medicine before doing incentive spirometry. It is harder to take a deep breath if you are having pain. How to use your incentive spirometer  Sit up on the edge of your bed or on a chair. Hold the incentive spirometer so that it is in an upright position. Before you use the spirometer, breathe out normally. Place the mouthpiece in your mouth. Make sure your lips are closed tightly around it. Breathe in slowly and as deeply as you can through your mouth, causing the piston or the ball to rise toward the top of the chamber. Hold your breath for 3-5 seconds, or for as long as possible. If the spirometer includes a coach indicator, use this to guide you in breathing. Slow down your breathing if the indicator goes above the marked areas. Remove the mouthpiece from your mouth and breathe out normally. The piston or ball will return to the bottom of the chamber. Rest for a few seconds, then repeat the steps 10 or more times. Take your time and take a few normal breaths between deep breaths so that you do not get dizzy or light-headed. Do this every 1-2 hours when you are awake. If the spirometer includes a goal marker to show the highest number you have reached (best effort), use this as a goal to work toward during each repetition. After each set of 10 deep breaths, cough a few times. This will help to make sure that your lungs are clear. If you have an incision on your chest or abdomen from surgery, place a pillow or a rolled-up towel firmly against the incision when you cough. This can help to reduce pain while taking deep breaths and coughing. General tips When you are able to get out of bed: Walk around often. Continue to take deep breaths and cough in order to clear your lungs. Keep using the incentive spirometer until your health care provider says it is  okay to stop using it. If you have been in the hospital, you may be told to keep using the spirometer at home. Contact a health care provider if: You are having difficulty using the spirometer. You have trouble using the spirometer as often as instructed. Your pain medicine is not giving enough relief for you to use the spirometer as told. You have a fever. Get help right away if: You develop shortness of breath. You develop a cough with bloody mucus from the lungs. You have fluid or blood coming from an incision site after you cough. Summary An incentive spirometer is a tool that can help you learn to take long, deep breaths to keep your lungs clear and active. You may be asked to use a spirometer after a surgery, if you have a lung problem or a history of smoking, or  if you have been inactive for a long period of time. Use your incentive spirometer as instructed every 1-2 hours while you are awake. If you have an incision on your chest or abdomen, place a pillow or a rolled-up towel firmly against your incision when you cough. This will help to reduce pain. Get help right away if you have shortness of breath, you cough up bloody mucus, or blood comes from your incision when you cough. This information is not intended to replace advice given to you by your health care provider. Make sure you discuss any questions you have with your health care provider. Document Revised: 04/16/2023 Document Reviewed: 04/16/2023 Elsevier Patient Education  2024 [employer].

## 2024-01-26 ENCOUNTER — Encounter (HOSPITAL_COMMUNITY)
Admission: RE | Admit: 2024-01-26 | Discharge: 2024-01-26 | Disposition: A | Discharge status: Home / Self Care | Source: Ambulatory Visit | Attending: Orthopedic Surgery | Admitting: Orthopedic Surgery

## 2024-01-26 ENCOUNTER — Other Ambulatory Visit: Payer: Self-pay

## 2024-01-26 ENCOUNTER — Encounter (HOSPITAL_COMMUNITY): Payer: Self-pay

## 2024-01-26 VITALS — BP 111/80 | HR 80 | Resp 18 | Ht 69.5 in | Wt 350.0 lb

## 2024-01-26 DIAGNOSIS — Z01818 Encounter for other preprocedural examination: Secondary | ICD-10-CM | POA: Diagnosis not present

## 2024-01-26 DIAGNOSIS — I10 Essential (primary) hypertension: Secondary | ICD-10-CM | POA: Diagnosis not present

## 2024-01-26 DIAGNOSIS — E119 Type 2 diabetes mellitus without complications: Secondary | ICD-10-CM | POA: Insufficient documentation

## 2024-01-26 DIAGNOSIS — E876 Hypokalemia: Secondary | ICD-10-CM

## 2024-01-31 ENCOUNTER — Telehealth: Payer: Self-pay | Admitting: Orthopedic Surgery

## 2024-01-31 NOTE — Telephone Encounter (Signed)
 Dr. Onesimo pt - pt lvm stating she was supposed to have surgery today and Dr. JAYSON is out.  She wants to know if Dr. VEAR does her surgery, how quick can she have it?  [phone]

## 2024-01-31 NOTE — Telephone Encounter (Signed)
 We have openings in September She would need to see Dr Margrette to discuss

## 2024-02-02 NOTE — Telephone Encounter (Signed)
 Pt would like to move forward w/ 03/06/24 as her surgery date. Please assist.

## 2024-02-02 NOTE — Telephone Encounter (Signed)
 Dr. Onesimo pt - Pt presented to the office today, she stated that they have cut her short term disability because they need a surgery date and an estimated time she'll be out.  I explained that I would send a message to Dr. JAYSON and Raymon to see if we can get an answer for her. [phone]

## 2024-02-04 ENCOUNTER — Encounter: Payer: Self-pay | Admitting: Orthopedic Surgery

## 2024-02-04 DIAGNOSIS — S43409A Unspecified sprain of unspecified shoulder joint, initial encounter: Secondary | ICD-10-CM | POA: Diagnosis not present

## 2024-02-04 DIAGNOSIS — I10 Essential (primary) hypertension: Secondary | ICD-10-CM | POA: Diagnosis not present

## 2024-02-04 DIAGNOSIS — E1165 Type 2 diabetes mellitus with hyperglycemia: Secondary | ICD-10-CM | POA: Diagnosis not present

## 2024-02-04 DIAGNOSIS — E782 Mixed hyperlipidemia: Secondary | ICD-10-CM | POA: Diagnosis not present

## 2024-02-04 NOTE — Telephone Encounter (Signed)
 This patient presented to the office this morning wanting to know if she was going to be able to have her surgery 915/25 as no one as contacted her.  She needs a letter today stating the date of her surgery and an estimated time of how long she'll be OOW.

## 2024-02-04 NOTE — Telephone Encounter (Signed)
 Spoke w/ pt and let her know that she will be able to proceed w 9/15 as her surgery date. I checked surgery board and pt is already posted for that day.

## 2024-02-09 ENCOUNTER — Other Ambulatory Visit: Payer: Self-pay | Admitting: Family Medicine

## 2024-02-09 ENCOUNTER — Other Ambulatory Visit (HOSPITAL_COMMUNITY): Payer: Self-pay | Admitting: Family Medicine

## 2024-02-09 DIAGNOSIS — E1165 Type 2 diabetes mellitus with hyperglycemia: Secondary | ICD-10-CM

## 2024-02-09 DIAGNOSIS — Z1231 Encounter for screening mammogram for malignant neoplasm of breast: Secondary | ICD-10-CM

## 2024-02-09 NOTE — Telephone Encounter (Signed)
 Per previous forms Dr. Onesimo does 12 wks estimated time out.

## 2024-02-11 ENCOUNTER — Encounter (HOSPITAL_COMMUNITY): Payer: Self-pay

## 2024-02-11 ENCOUNTER — Ambulatory Visit (HOSPITAL_COMMUNITY)
Admission: RE | Admit: 2024-02-11 | Discharge: 2024-02-11 | Disposition: A | Discharge status: Home / Self Care | Source: Ambulatory Visit | Attending: Family Medicine | Admitting: Family Medicine

## 2024-02-11 DIAGNOSIS — Z1231 Encounter for screening mammogram for malignant neoplasm of breast: Secondary | ICD-10-CM | POA: Insufficient documentation

## 2024-03-01 NOTE — Patient Instructions (Addendum)
 Your procedure is scheduled on: 03/06/2024  Report to [facility] Main Entrance at   6:00  AM.  Call this number if you have problems the morning of surgery: [phone]   Remember:   Do not Eat after midnight, You may have CLEAR liquids until 3:30 am  water, tea, soda or Black coffee, NO CREAM OR MILK  Drink 1 Gatorade Zero or water  at 3:30 am         No Smoking the morning of surgery  :  Take these medicines the morning of surgery with A SIP OF WATER: Amolodipine and oxycodone  if needed   Do not wear jewelry, make-up or nail polish.  Do not wear lotions, powders, or perfumes. You may wear deodorant.  Do not shave 48 hours prior to surgery. Men may shave face and neck.  Do not bring valuables to the hospital.  Contacts, dentures or bridgework may not be worn into surgery.  Leave suitcase in the car. After surgery it may be brought to your room.  For patients admitted to the hospital, checkout time is 11:00 AM the day of discharge.   Patients discharged the day of surgery will not be allowed to drive home.    Special Instructions: Shower using CHG night before surgery and shower the day of surgery use CHG.  Use special wash - you have one bottle of CHG for all showers.  You should use approximately 1/2 of the bottle for each shower. How to Use Chlorhexidine  at Home in the Shower Chlorhexidine  gluconate (CHG) is a germ-killing (antiseptic) wash that's used to clean the skin. It can get rid of the germs that normally live on the skin and can keep them away for about 24 hours. If you're having surgery, you may be told to shower with CHG at home the night before surgery. This can help lower your risk for infection. To use CHG wash in the shower, follow the steps below. Supplies needed: CHG body wash. Clean washcloth. Clean towel. How to use CHG in the shower Follow these steps unless you're told to use CHG in a different way: Start the shower. Use your normal soap and shampoo to  wash your face and hair. Turn off the shower or move out of the shower stream. Pour CHG onto a clean washcloth. Do not use any type of brush or rough sponge. Start at your neck, washing your body down to your toes. Make sure you: Wash the part of your body where the surgery will be done for at least 1 minute. Do not scrub. Do not use CHG on your head or face unless your health care provider tells you to. If it gets into your ears or eyes, rinse them well with water. Do not wash your genitals with CHG. Wash your back and under your arms. Make sure to wash skin folds. Let the CHG sit on your skin for 1-2 minutes or as long as told. Rinse your entire body in the shower, including all body creases and folds. Turn off the shower. Dry off with a clean towel. Do not put anything on your skin afterward, such as powder, lotion, or perfume. Put on clean clothes or pajamas. If it's the night before surgery, sleep in clean sheets. General tips Use CHG only as told, and follow the instructions on the label. Use the full amount of CHG as told. This is often one bottle. Do not smoke and stay away from flames after using CHG. Your skin  may feel sticky after using CHG. This is normal. The sticky feeling will go away as the CHG dries. Do not use CHG: If you have a chlorhexidine  allergy or have reacted to chlorhexidine  in the past. On open wounds or areas of skin that have broken skin, cuts, or scrapes. On babies younger than 39 months of age. Contact a health care provider if: You have questions about using CHG. Your skin gets irritated or itchy. You have a rash after using CHG. You swallow any CHG. Call your local poison control center [phone] in the [place] Your eyes itch badly, or they become very red or swollen. Your hearing changes. You have trouble seeing. If you can't reach your provider, go to an urgent care or emergency room. Do not drive yourself. Get help right away if: You have  swelling or tingling in your mouth or throat. You make high-pitched whistling sounds when you breathe, most often when you breathe out (wheeze). You have trouble breathing. These symptoms may be an emergency. Call 911 right away. Do not wait to see if the symptoms will go away. Do not drive yourself to the hospital. This information is not intended to replace advice given to you by your health care provider. Make sure you discuss any questions you have with your health care provider. Document Revised: 12/22/2022 Document Reviewed: 12/18/2021 Elsevier Patient Education  2024 [employer].  How to Use an Incentive Spirometer An incentive spirometer is a tool that measures how well you are filling your lungs with each breath. Learning to take long, deep breaths using this tool can help you keep your lungs clear and active. This may help to reverse or lessen your chance of developing breathing (pulmonary) problems, especially infection. You may be asked to use a spirometer: After a surgery. If you have a lung problem or a history of smoking. After a long period of time when you have been unable to move or be active. If the spirometer includes an indicator to show the highest number that you have reached, your health care provider or respiratory therapist will help you set a goal. Keep a log of your progress as told by your health care provider. What are the risks? Breathing too quickly may cause dizziness or cause you to pass out. Take your time so you do not get dizzy or light-headed. If you are in pain, you may need to take pain medicine before doing incentive spirometry. It is harder to take a deep breath if you are having pain. How to use your incentive spirometer  Sit up on the edge of your bed or on a chair. Hold the incentive spirometer so that it is in an upright position. Before you use the spirometer, breathe out normally. Place the mouthpiece in your mouth. Make sure your lips are  closed tightly around it. Breathe in slowly and as deeply as you can through your mouth, causing the piston or the ball to rise toward the top of the chamber. Hold your breath for 3-5 seconds, or for as long as possible. If the spirometer includes a coach indicator, use this to guide you in breathing. Slow down your breathing if the indicator goes above the marked areas. Remove the mouthpiece from your mouth and breathe out normally. The piston or ball will return to the bottom of the chamber. Rest for a few seconds, then repeat the steps 10 or more times. Take your time and take a few normal breaths between deep breaths so  that you do not get dizzy or light-headed. Do this every 1-2 hours when you are awake. If the spirometer includes a goal marker to show the highest number you have reached (best effort), use this as a goal to work toward during each repetition. After each set of 10 deep breaths, cough a few times. This will help to make sure that your lungs are clear. If you have an incision on your chest or abdomen from surgery, place a pillow or a rolled-up towel firmly against the incision when you cough. This can help to reduce pain while taking deep breaths and coughing. General tips When you are able to get out of bed: Walk around often. Continue to take deep breaths and cough in order to clear your lungs. Keep using the incentive spirometer until your health care provider says it is okay to stop using it. If you have been in the hospital, you may be told to keep using the spirometer at home. Contact a health care provider if: You are having difficulty using the spirometer. You have trouble using the spirometer as often as instructed. Your pain medicine is not giving enough relief for you to use the spirometer as told. You have a fever. Get help right away if: You develop shortness of breath. You develop a cough with bloody mucus from the lungs. You have fluid or blood coming from an  incision site after you cough. Summary An incentive spirometer is a tool that can help you learn to take long, deep breaths to keep your lungs clear and active. You may be asked to use a spirometer after a surgery, if you have a lung problem or a history of smoking, or if you have been inactive for a long period of time. Use your incentive spirometer as instructed every 1-2 hours while you are awake. If you have an incision on your chest or abdomen, place a pillow or a rolled-up towel firmly against your incision when you cough. This will help to reduce pain. Get help right away if you have shortness of breath, you cough up bloody mucus, or blood comes from your incision when you cough. This information is not intended to replace advice given to you by your health care provider. Make sure you discuss any questions you have with your health care provider. Document Revised: 04/16/2023 Document Reviewed: 04/16/2023 Elsevier Patient Education  2024 [employer]  Shoulder Arthroscopy, Care After The following information offers guidance on how to care for yourself after your procedure. Your health care provider may also give you more specific instructions. If you have problems or questions, contact your health care provider. What can I expect after the procedure? After the procedure, it is common to have: Pain. Swelling. A small amount of fluid from the incision. Stiffness that improves over time. Follow these instructions at home: If you have a sling or an immobilizer: Wear it as told by your health care provider. Remove it only as told by your health care provider. These devices protect your shoulder and help it heal by keeping it in place. Check the skin around it every day. Tell your health care provider about any concerns. Loosen it if your fingers tingle, become numb, or turn cold and blue. Keep the sling or immobilizer clean. If it is not waterproof: Do not let it get wet. Cover it  with a watertight covering when you take a bath or a shower. Incision care  Follow instructions from your health care provider about  how to take care of your incisions. Make sure you: Wash your hands with soap and water for at least 20 seconds before and after you change your bandage (dressing). If soap and water are not available, use hand sanitizer. Change your dressing as told by your health care provider. Leave stitches (sutures), skin glue, or adhesive strips in place. These skin closures may need to stay in place for 2 weeks or longer. If adhesive strip edges start to loosen and curl up, you may trim the loose edges. Do not remove adhesive strips completely unless your health care provider tells you to do that. Check your incision areas every day for signs of infection. Check for: Redness. More swelling or pain. Blood or more fluid. Warmth. Pus or a bad smell. Do not take baths, swim, or use a hot tub until your health care provider approves. Ask your health care provider if you may take showers. You may only be allowed to take sponge baths. Managing pain, stiffness, and swelling  If directed, put ice on the affected area. To do this: If you have a removable sling or immobilizer, remove it as told by your health care provider. Put ice in a plastic bag or use the icing device (cold therapy unit) that you were given. Follow instructions from your health care provider about how to use the icing device. Place a towel between your skin and the bag or between your skin and the icing device. Leave the ice on for 20 minutes, 2-3 times a day. Remove the ice if your skin turns bright red. This is very important. If you cannot feel pain, heat, or cold, you have a greater risk of damage to the area. Move your fingers often to reduce stiffness and swelling. Raise (elevate) the injured area above the level of your heart while you are lying down. It may help to sleep in a sitting position for a few  days after your procedure. Try sleeping in a reclining chair or propping yourself up with extra pillows in bed. Activity Ask your health care provider what activities are safe for you during recovery. Do not lift with your affected shoulder until your health care provider approves. Avoid pulling and pushing with the arm on your affected side. If physical therapy was prescribed, do exercises as directed. Doing exercises may help to improve shoulder movement and flexibility (range of motion). Driving Ask your health care provider when it is safe to drive if you have a sling or immobilizer. Ask your health care provider if the medicine prescribed to you requires you to avoid driving or using machinery. General instructions Take over-the-counter and prescription medicines only as told by your health care provider. Ask your health care provider if the medicine prescribed to you can cause constipation. You may need to take these actions to prevent or treat constipation: Drink enough fluid to keep your urine pale yellow. Take over-the-counter or prescription medicines. Eat foods that are high in fiber, such as beans, whole grains, and fresh fruits and vegetables. Limit foods that are high in fat and processed sugars, such as fried or sweet foods. Do not use any products that contain nicotine or tobacco. These products include cigarettes, chewing tobacco, and vaping devices, such as e-cigarettes. These can delay incision healing after surgery. If you need help quitting, ask your health care provider. Keep all follow-up visits. This is important. Contact a health care provider if: You have a fever or chills. You have severe pain. You have  any of these signs of infection: Redness around an incision. More swelling or pain in an incision area. Blood or more fluid coming from an incision. Warmth coming from an incision. Pus or a bad smell coming from an incision. You notice that an incision has opened  up. You develop a rash. Get help right away if: You have difficulty breathing. You have chest pain. You notice that your fingers tingle, are numb, or are cold and blue even after you loosen your sling or immobilizer. You develop pain in your lower leg or at the back of your knee. These symptoms may represent a serious problem that is an emergency. Do not wait to see if the symptoms will go away. Get medical help right away. Call your local emergency services (911 in the [place] Do not drive yourself to the hospital. Summary If you have a sling or an immobilizer, wear it as told by your health care provider. It may help to sleep in a sitting position for a few days after your procedure. If physical therapy was prescribed, do exercises as directed. Doing exercises may help to improve shoulder movement and flexibility (range of motion). Keep all follow-up visits. This is important. This information is not intended to replace advice given to you by your health care provider. Make sure you discuss any questions you have with your health care provider. Document Revised: 02/05/2020 Document Reviewed: 02/05/2020 Elsevier Patient Education  2024 [employer] Inc[employer] General Anesthesia, Adult, Care After The following information offers guidance on how to care for yourself after your procedure. Your health care provider may also give you more specific instructions. If you have problems or questions, contact your health care provider. What can I expect after the procedure? After the procedure, it is common for people to: Have pain or discomfort at the IV site. Have nausea or vomiting. Have a sore throat or hoarseness. Have trouble concentrating. Feel cold or chills. Feel weak, sleepy, or tired (fatigue). Have soreness and body aches. These can affect parts of the body that were not involved in surgery. Follow these instructions at home: For the time period you were told by your health care  provider:  Rest. Do not participate in activities where you could fall or become injured. Do not drive or use machinery. Do not drink alcohol. Do not take sleeping pills or medicines that cause drowsiness. Do not make important decisions or sign legal documents. Do not take care of children on your own. General instructions Drink enough fluid to keep your urine pale yellow. If you have sleep apnea, surgery and certain medicines can increase your risk for breathing problems. Follow instructions from your health care provider about wearing your sleep device: Anytime you are sleeping, including during daytime naps. While taking prescription pain medicines, sleeping medicines, or medicines that make you drowsy. Return to your normal activities as told by your health care provider. Ask your health care provider what activities are safe for you. Take over-the-counter and prescription medicines only as told by your health care provider. Do not use any products that contain nicotine or tobacco. These products include cigarettes, chewing tobacco, and vaping devices, such as e-cigarettes. These can delay incision healing after surgery. If you need help quitting, ask your health care provider. Contact a health care provider if: You have nausea or vomiting that does not get better with medicine. You vomit every time you eat or drink. You have pain that does not get better with medicine. You cannot urinate or  have bloody urine. You develop a skin rash. You have a fever. Get help right away if: You have trouble breathing. You have chest pain. You vomit blood. These symptoms may be an emergency. Get help right away. Call 911. Do not wait to see if the symptoms will go away. Do not drive yourself to the hospital. Summary After the procedure, it is common to have a sore throat, hoarseness, nausea, vomiting, or to feel weak, sleepy, or fatigue. For the time period you were told by your health care  provider, do not drive or use machinery. Get help right away if you have difficulty breathing, have chest pain, or vomit blood. These symptoms may be an emergency. This information is not intended to replace advice given to you by your health care provider. Make sure you discuss any questions you have with your health care provider. Document Revised: 09/05/2021 Document Reviewed: 09/05/2021 Elsevier Patient Education  2024 [employer].

## 2024-03-02 ENCOUNTER — Encounter (HOSPITAL_COMMUNITY)
Admission: RE | Admit: 2024-03-02 | Discharge: 2024-03-02 | Disposition: A | Discharge status: Home / Self Care | Source: Ambulatory Visit | Attending: Orthopedic Surgery | Admitting: Orthopedic Surgery

## 2024-03-02 DIAGNOSIS — Z01818 Encounter for other preprocedural examination: Secondary | ICD-10-CM

## 2024-03-02 DIAGNOSIS — N289 Disorder of kidney and ureter, unspecified: Secondary | ICD-10-CM

## 2024-03-02 DIAGNOSIS — E119 Type 2 diabetes mellitus without complications: Secondary | ICD-10-CM

## 2024-03-06 ENCOUNTER — Other Ambulatory Visit: Payer: Self-pay

## 2024-03-06 ENCOUNTER — Ambulatory Visit (HOSPITAL_COMMUNITY)
Admission: RE | Admit: 2024-03-06 | Discharge: 2024-03-06 | Disposition: A | Discharge status: Home / Self Care | Attending: Orthopedic Surgery | Admitting: Orthopedic Surgery

## 2024-03-06 ENCOUNTER — Encounter (HOSPITAL_COMMUNITY): Payer: Self-pay | Admitting: Orthopedic Surgery

## 2024-03-06 ENCOUNTER — Ambulatory Visit (HOSPITAL_COMMUNITY): Payer: Self-pay | Admitting: Anesthesiology

## 2024-03-06 ENCOUNTER — Encounter (HOSPITAL_COMMUNITY)
Admission: RE | Disposition: A | Discharge status: Home / Self Care | Payer: Self-pay | Source: Home / Self Care | Attending: Orthopedic Surgery

## 2024-03-06 ENCOUNTER — Encounter: Payer: Self-pay | Admitting: Orthopedic Surgery

## 2024-03-06 DIAGNOSIS — N189 Chronic kidney disease, unspecified: Secondary | ICD-10-CM | POA: Diagnosis not present

## 2024-03-06 DIAGNOSIS — S46012D Strain of muscle(s) and tendon(s) of the rotator cuff of left shoulder, subsequent encounter: Secondary | ICD-10-CM

## 2024-03-06 DIAGNOSIS — S46012A Strain of muscle(s) and tendon(s) of the rotator cuff of left shoulder, initial encounter: Secondary | ICD-10-CM | POA: Diagnosis not present

## 2024-03-06 DIAGNOSIS — Z7984 Long term (current) use of oral hypoglycemic drugs: Secondary | ICD-10-CM | POA: Insufficient documentation

## 2024-03-06 DIAGNOSIS — Z87891 Personal history of nicotine dependence: Secondary | ICD-10-CM | POA: Insufficient documentation

## 2024-03-06 DIAGNOSIS — I10 Essential (primary) hypertension: Secondary | ICD-10-CM | POA: Insufficient documentation

## 2024-03-06 DIAGNOSIS — W010XXA Fall on same level from slipping, tripping and stumbling without subsequent striking against object, initial encounter: Secondary | ICD-10-CM | POA: Diagnosis not present

## 2024-03-06 DIAGNOSIS — Z6841 Body Mass Index (BMI) 40.0 and over, adult: Secondary | ICD-10-CM | POA: Insufficient documentation

## 2024-03-06 DIAGNOSIS — E119 Type 2 diabetes mellitus without complications: Secondary | ICD-10-CM | POA: Insufficient documentation

## 2024-03-06 DIAGNOSIS — Z7985 Long-term (current) use of injectable non-insulin antidiabetic drugs: Secondary | ICD-10-CM | POA: Diagnosis not present

## 2024-03-06 DIAGNOSIS — Z01818 Encounter for other preprocedural examination: Secondary | ICD-10-CM

## 2024-03-06 DIAGNOSIS — I129 Hypertensive chronic kidney disease with stage 1 through stage 4 chronic kidney disease, or unspecified chronic kidney disease: Secondary | ICD-10-CM | POA: Diagnosis not present

## 2024-03-06 DIAGNOSIS — E669 Obesity, unspecified: Secondary | ICD-10-CM | POA: Insufficient documentation

## 2024-03-06 DIAGNOSIS — N289 Disorder of kidney and ureter, unspecified: Secondary | ICD-10-CM

## 2024-03-06 DIAGNOSIS — G8918 Other acute postprocedural pain: Secondary | ICD-10-CM | POA: Diagnosis not present

## 2024-03-06 DIAGNOSIS — Q681 Congenital deformity of finger(s) and hand: Secondary | ICD-10-CM | POA: Insufficient documentation

## 2024-03-06 HISTORY — PX: ARTHOSCOPIC ROTAOR CUFF REPAIR: SHX5002

## 2024-03-06 LAB — GLUCOSE, CAPILLARY
Glucose-Capillary: 141 mg/dL — ABNORMAL HIGH (ref 70–99)
Glucose-Capillary: 190 mg/dL — ABNORMAL HIGH (ref 70–99)

## 2024-03-06 SURGERY — REPAIR, ROTATOR CUFF, ARTHROSCOPIC
Anesthesia: General | Site: Shoulder | Laterality: Left

## 2024-03-06 MED ORDER — ONDANSETRON HCL 4 MG PO TABS: 4.0000 mg | ORAL_TABLET | Freq: Three times a day (TID) | ORAL | 0 refills | Status: AC | PRN

## 2024-03-06 MED ORDER — MIDAZOLAM HCL 2 MG/2ML IJ SOLN
INTRAMUSCULAR | Status: DC | PRN
  Administered 2024-03-06: 2 mg via INTRAVENOUS

## 2024-03-06 MED ORDER — FENTANYL CITRATE (PF) 100 MCG/2ML IJ SOLN
INTRAMUSCULAR | Status: AC
  Filled 2024-03-06: qty 2

## 2024-03-06 MED ORDER — LACTATED RINGERS IV SOLN
INTRAVENOUS | Status: DC
Start: 2024-03-06 — End: 2024-03-06

## 2024-03-06 MED ORDER — ROCURONIUM BROMIDE 10 MG/ML (PF) SYRINGE
PREFILLED_SYRINGE | INTRAVENOUS | Status: AC
  Filled 2024-03-06: qty 10

## 2024-03-06 MED ORDER — ONDANSETRON HCL 4 MG/2ML IJ SOLN
4.0000 mg | Freq: Once | INTRAMUSCULAR | Status: AC | PRN
  Administered 2024-03-06: 4 mg via INTRAVENOUS

## 2024-03-06 MED ORDER — ORAL CARE MOUTH RINSE: 15.0000 mL | Freq: Once | OROMUCOSAL | Status: AC

## 2024-03-06 MED ORDER — SUGAMMADEX SODIUM 200 MG/2ML IV SOLN
INTRAVENOUS | Status: DC | PRN
  Administered 2024-03-06: 200 mg via INTRAVENOUS

## 2024-03-06 MED ORDER — SODIUM CHLORIDE 0.9 % IV SOLN
3.0000 g | INTRAVENOUS | Status: AC
  Administered 2024-03-06: 3 g via INTRAVENOUS
  Filled 2024-03-06: qty 3

## 2024-03-06 MED ORDER — SUCCINYLCHOLINE CHLORIDE 200 MG/10ML IV SOSY
PREFILLED_SYRINGE | INTRAVENOUS | Status: DC | PRN
  Administered 2024-03-06: 180 mg via INTRAVENOUS

## 2024-03-06 MED ORDER — DEXAMETHASONE SODIUM PHOSPHATE 10 MG/ML IJ SOLN
INTRAMUSCULAR | Status: DC | PRN
  Administered 2024-03-06: 5 mg via INTRAVENOUS

## 2024-03-06 MED ORDER — ROCURONIUM BROMIDE 10 MG/ML (PF) SYRINGE
PREFILLED_SYRINGE | INTRAVENOUS | Status: AC
  Filled 2024-03-06: qty 20

## 2024-03-06 MED ORDER — CHLORHEXIDINE GLUCONATE 0.12 % MT SOLN
15.0000 mL | Freq: Once | OROMUCOSAL | Status: AC
  Administered 2024-03-06: 15 mL via OROMUCOSAL
  Filled 2024-03-06: qty 15

## 2024-03-06 MED ORDER — PROPOFOL 10 MG/ML IV BOLUS
INTRAVENOUS | Status: DC | PRN
  Administered 2024-03-06: 200 mg via INTRAVENOUS

## 2024-03-06 MED ORDER — ROPIVACAINE HCL 5 MG/ML IJ SOLN
INTRAMUSCULAR | Status: DC | PRN
  Administered 2024-03-06 (×3): 10 mL via PERINEURAL

## 2024-03-06 MED ORDER — MIDAZOLAM HCL 2 MG/2ML IJ SOLN
INTRAMUSCULAR | Status: AC
  Filled 2024-03-06: qty 2

## 2024-03-06 MED ORDER — ACETAMINOPHEN 500 MG PO TABS: 1000.0000 mg | ORAL_TABLET | Freq: Three times a day (TID) | ORAL | 0 refills | Status: AC

## 2024-03-06 MED ORDER — PROPOFOL 10 MG/ML IV BOLUS
INTRAVENOUS | Status: AC
  Filled 2024-03-06: qty 20

## 2024-03-06 MED ORDER — OXYCODONE HCL 5 MG PO TABS: 5.0000 mg | ORAL_TABLET | Freq: Once | ORAL | Status: DC | PRN

## 2024-03-06 MED ORDER — SUCCINYLCHOLINE CHLORIDE 200 MG/10ML IV SOSY
PREFILLED_SYRINGE | INTRAVENOUS | Status: AC
  Filled 2024-03-06: qty 20

## 2024-03-06 MED ORDER — EPINEPHRINE PF 1 MG/ML IJ SOLN
INTRAMUSCULAR | Status: AC
Start: 2024-03-06 — End: 2024-03-06
  Filled 2024-03-06: qty 10

## 2024-03-06 MED ORDER — ASPIRIN 81 MG PO TBEC: 81.0000 mg | DELAYED_RELEASE_TABLET | Freq: Two times a day (BID) | ORAL | 0 refills | Status: AC

## 2024-03-06 MED ORDER — PHENYLEPHRINE HCL (PRESSORS) 10 MG/ML IV SOLN
INTRAVENOUS | Status: DC | PRN
Start: 2024-03-06 — End: 2024-03-06
  Administered 2024-03-06: 100 ug via INTRAVENOUS

## 2024-03-06 MED ORDER — DEXAMETHASONE SODIUM PHOSPHATE 10 MG/ML IJ SOLN
INTRAMUSCULAR | Status: AC
  Filled 2024-03-06: qty 1

## 2024-03-06 MED ORDER — OXYCODONE HCL 5 MG/5ML PO SOLN: 5.0000 mg | Freq: Once | ORAL | Status: DC | PRN

## 2024-03-06 MED ORDER — SODIUM CHLORIDE 0.9 % IR SOLN
Status: DC | PRN
  Administered 2024-03-06 (×8): 3000 mL

## 2024-03-06 MED ORDER — ONDANSETRON HCL 4 MG/2ML IJ SOLN
INTRAMUSCULAR | Status: AC
  Filled 2024-03-06: qty 2

## 2024-03-06 MED ORDER — CELECOXIB 100 MG PO CAPS: 100.0000 mg | ORAL_CAPSULE | Freq: Every day | ORAL | 0 refills | Status: AC

## 2024-03-06 MED ORDER — FENTANYL CITRATE PF 50 MCG/ML IJ SOSY: 25.0000 ug | PREFILLED_SYRINGE | INTRAMUSCULAR | Status: DC | PRN

## 2024-03-06 MED ORDER — PHENYLEPHRINE HCL-NACL 20-0.9 MG/250ML-% IV SOLN
INTRAVENOUS | Status: AC
  Filled 2024-03-06: qty 250

## 2024-03-06 MED ORDER — ROPIVACAINE HCL 5 MG/ML IJ SOLN
INTRAMUSCULAR | Status: AC
  Filled 2024-03-06: qty 30

## 2024-03-06 MED ORDER — BUPIVACAINE-EPINEPHRINE (PF) 0.5% -1:200000 IJ SOLN
INTRAMUSCULAR | Status: AC
Start: 2024-03-06 — End: 2024-03-06
  Filled 2024-03-06: qty 30

## 2024-03-06 MED ORDER — ROCURONIUM BROMIDE 10 MG/ML (PF) SYRINGE
PREFILLED_SYRINGE | INTRAVENOUS | Status: DC | PRN
  Administered 2024-03-06: 70 mg via INTRAVENOUS
  Administered 2024-03-06: 30 mg via INTRAVENOUS
  Administered 2024-03-06 (×2): 10 mg via INTRAVENOUS

## 2024-03-06 MED ORDER — FENTANYL CITRATE (PF) 100 MCG/2ML IJ SOLN
INTRAMUSCULAR | Status: DC | PRN
  Administered 2024-03-06 (×2): 50 ug via INTRAVENOUS

## 2024-03-06 MED ORDER — OXYCODONE HCL 5 MG PO TABS: 5.0000 mg | ORAL_TABLET | ORAL | 0 refills | Status: AC | PRN

## 2024-03-06 MED ORDER — PHENYLEPHRINE HCL-NACL 20-0.9 MG/250ML-% IV SOLN
INTRAVENOUS | Status: DC | PRN
  Administered 2024-03-06: 40 ug/min via INTRAVENOUS

## 2024-03-06 SURGICAL SUPPLY — 53 items
ANCHOR SUT BIO SW 4.75X19.1 (Anchor) IMPLANT
ANCHOR SUT FBRTK 2.6X1.7X2 (Anchor) IMPLANT
ANCHOR SWIVELOCK BIO 4.75X19.1 (Anchor) IMPLANT
BLADE SURG SZ11 CARB STEEL (BLADE) ×1 IMPLANT
BNDG GAUZE ELAST 4 BULKY (GAUZE/BANDAGES/DRESSINGS) ×2 IMPLANT
CANNULA TWIST IN 8.25X7CM (CANNULA) IMPLANT
CHLORAPREP W/TINT 26 (MISCELLANEOUS) ×1 IMPLANT
CLOTH BEACON ORANGE TIMEOUT ST (SAFETY) ×1 IMPLANT
COOLER ICEMAN CLASSIC (MISCELLANEOUS) ×1 IMPLANT
COUNTER NDL MAGNETIC 40 RED (SET/KITS/TRAYS/PACK) ×1 IMPLANT
COUNTER NEEDLE MAGNETIC 40 RED (SET/KITS/TRAYS/PACK) ×1 IMPLANT
COVER LIGHT HANDLE STERIS (MISCELLANEOUS) ×2 IMPLANT
CUTTER BONE 4.0MM X 13CM (MISCELLANEOUS) IMPLANT
DRAPE SHOULDER BEACH CHAIR (DRAPES) ×1 IMPLANT
DRAPE U 60X70 (DRAPES) ×2 IMPLANT
ELECTRODE REM PT RTRN 9FT ADLT (ELECTROSURGICAL) ×1 IMPLANT
GAUZE SPONGE 4X4 12PLY STRL (GAUZE/BANDAGES/DRESSINGS) ×1 IMPLANT
GAUZE XEROFORM 1X8 LF (GAUZE/BANDAGES/DRESSINGS) ×1 IMPLANT
GLOVE BIO SURGEON STRL SZ8 (GLOVE) ×4 IMPLANT
GLOVE BIOGEL PI IND STRL 7.0 (GLOVE) ×2 IMPLANT
GLOVE BIOGEL PI IND STRL 8 (GLOVE) ×1 IMPLANT
GOWN STRL REUS W/TWL LRG LVL3 (GOWN DISPOSABLE) ×2 IMPLANT
GOWN STRL REUS W/TWL XL LVL3 (GOWN DISPOSABLE) ×1 IMPLANT
IMPL FIBERTAK KNTLS 2.6 (Anchor) IMPLANT
KIT ANCHOR FBRTK 2.6 STR (KITS) IMPLANT
KIT POSITION SHOULDER SCHLEI (MISCELLANEOUS) ×1 IMPLANT
KIT STABILIZATION SHOULDER (MISCELLANEOUS) ×1 IMPLANT
KIT TURNOVER KIT A (KITS) ×1 IMPLANT
MANIFOLD NEPTUNE II (INSTRUMENTS) ×1 IMPLANT
MARKER SKIN DUAL TIP RULER LAB (MISCELLANEOUS) ×1 IMPLANT
NDL HD SCORPION MEGA LOADER (NEEDLE) IMPLANT
NDL HYPO 21X1.5 SAFETY (NEEDLE) ×1 IMPLANT
NDL SPNL 18GX3.5 QUINCKE PK (NEEDLE) ×1 IMPLANT
NEEDLE HYPO 21X1.5 SAFETY (NEEDLE) ×1 IMPLANT
NEEDLE SPNL 18GX3.5 QUINCKE PK (NEEDLE) ×1 IMPLANT
NS IRRIG 1000ML POUR BTL (IV SOLUTION) ×1 IMPLANT
PACK TOTAL JOINT (CUSTOM PROCEDURE TRAY) ×1 IMPLANT
PAD ABD 5X9 TENDERSORB (GAUZE/BANDAGES/DRESSINGS) ×3 IMPLANT
PAD ARMBOARD POSITIONER FOAM (MISCELLANEOUS) ×1 IMPLANT
PAD COLD SHLDR SM WRAP-ON (PAD) ×1 IMPLANT
SET ARTHROSCOPY INST (INSTRUMENTS) ×1 IMPLANT
SET BASIN LINEN APH (SET/KITS/TRAYS/PACK) ×1 IMPLANT
SLING ARM ULTRA III XL (SLING) IMPLANT
SOL .9 NS 3000ML IRR UROMATIC (IV SOLUTION) ×2 IMPLANT
SUT 3-0 BLK 1X30 PSL (SUTURE) IMPLANT
SUTURE TAPE 1.3 40 TPR END (SUTURE) IMPLANT
SUTURE TAPE TIGERLINK 1.3MM BL (SUTURE) IMPLANT
SYR 30ML LL (SYRINGE) ×1 IMPLANT
SYR BULB IRRIG 60ML STRL (SYRINGE) ×1 IMPLANT
TOWEL OR 17X26 4PK STRL BLUE (TOWEL DISPOSABLE) IMPLANT
TUBE CONNECTING 12X1/4 (SUCTIONS) ×1 IMPLANT
TUBING IN/OUT FLOW W/MAIN PUMP (TUBING) ×1 IMPLANT
WAND ABLATOR APOLLO I90 (BUR) IMPLANT

## 2024-03-06 NOTE — Interval H&P Note (Signed)
 History and Physical Interval Note:  03/06/2024 7:56 AM  Carrie Mitchell  has presented today for surgery, with the diagnosis of Massive left rotator cuff tear.  The various methods of treatment have been discussed with the patient and family. After consideration of risks, benefits and other options for treatment, the patient has consented to  Procedure(s): REPAIR, ROTATOR CUFF, ARTHROSCOPIC (Left) as a surgical intervention.  The patient's history has been reviewed, patient examined, no change in status, stable for surgery.  I have reviewed the patient's chart and labs.  Questions were answered to the patient's satisfaction.     Oneil DELENA Horde

## 2024-03-06 NOTE — Anesthesia Preprocedure Evaluation (Signed)
 Anesthesia Evaluation  Patient identified by MRN, date of birth, ID band Patient awake    Reviewed: Allergy & Precautions, H&P , NPO status , Patient's Chart, lab work & pertinent test results, reviewed documented beta blocker date and time   Airway Mallampati: II  TM Distance: >3 FB Neck ROM: full    Dental no notable dental hx.    Pulmonary neg pulmonary ROS, former smoker   Pulmonary exam normal breath sounds clear to auscultation       Cardiovascular Exercise Tolerance: Good hypertension, negative cardio ROS  Rhythm:regular Rate:Normal     Neuro/Psych negative neurological ROS  negative psych ROS   GI/Hepatic negative GI ROS, Neg liver ROS,,,  Endo/Other  diabetes  Class 4 obesity  Renal/GU Renal diseasenegative Renal ROS  negative genitourinary   Musculoskeletal   Abdominal   Peds  Hematology negative hematology ROS (+)   Anesthesia Other Findings   Reproductive/Obstetrics negative OB ROS                              Anesthesia Physical Anesthesia Plan  ASA: 3  Anesthesia Plan: General and General ETT   Post-op Pain Management: Regional block*   Induction:   PONV Risk Score and Plan: Ondansetron   Airway Management Planned:   Additional Equipment:   Intra-op Plan:   Post-operative Plan:   Informed Consent: I have reviewed the patients History and Physical, chart, labs and discussed the procedure including the risks, benefits and alternatives for the proposed anesthesia with the patient or authorized representative who has indicated his/her understanding and acceptance.     Dental Advisory Given  Plan Discussed with: CRNA  Anesthesia Plan Comments:         Anesthesia Quick Evaluation

## 2024-03-06 NOTE — Op Note (Signed)
 Orthopaedic Surgery Operative Note (CSN: [id number])  Carrie  Mitchell  09/15/1970 Date of Surgery: 03/06/2024   Diagnoses:  Massive left rotator cuff tear  Procedure: Arthroscopic subacromial decompression Arthroscopic rotator cuff repair   Operative Finding Exam under anesthesia: Full range of motion without restrictions Articular space: No loose bodies, capsule intact, labrum intact.  Large Buford complex in the anterior aspect of the glenohumeral joint Chondral surfaces:Intact, no sign of chondral degeneration on the glenoid or humeral head Biceps: Fraying within the intra-articular portion of the biceps tendon.  There was inflamed tissue around its insertion to the superior aspect of the glenoid Subscapularis: Intact Superior Cuff: Large tear visible from within the joint Bursal side: Large full-thickness tear.  Approximately 2.5 cm from anterior to posterior, with 2-2.5 cm of retraction.  There were some fibers anteriorly, which remained intact.  Altogether, this portion of the tendon was approximately 7 mm in thickness, and essentially splinted the remaining portion of the rotator cuff tendon.  Successful completion of the planned procedure.  2 medial row anchors, as well as 2 lateral anchors, with use of the knotless mechanism to help provide additional compression at the footprint of the rotator cuff repair.   Post-Op Diagnosis: Same Surgeons:Primary: Onesimo Oneil LABOR, MD Assistants: Montie Seltzer Location: [facility] OR ROOM 4 Anesthesia: General with interscalene block Antibiotics: Ancef  3 g Tourniquet time: None Estimated Blood Loss: Minimal Complications: None Specimens: None  Implants: Implant Name Type Inv. Item Serial No. Manufacturer Lot No. LRB No. Used Action  IMPL FIBERTAK KNTLS 2.6 - [id number] Anchor IMPL FIBERTAK KNTLS 2.6  [employer] [id number] Left 1 Implanted  ANCHOR SUT FBRTK 2.6X1.7X2 - [id number] Anchor ANCHOR SUT FBRTK 2.6X1.7X2  [employer] [id number] Left 1  Implanted  [facility] BIO 4.75X19.1 - [id number] Anchor ANCHOR [place] BIO 4.75X19.1  [employer] [id number] Left 1 Implanted  ANCHOR SUT BIO SW 4.75X19.1 - [id number] Anchor ANCHOR SUT BIO SW 4.75X19.1  [employer] [id number] Left 1 Implanted    Indications for Surgery:   Carrie  Mitchell is a 53 y.o. female with continued shoulder pain refractory to nonoperative measures for extended period of time.  She sustained a fall, approximately 2 months ago.  She is left-hand dominant.  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  She relies heavily on the left arm as a result.  After the fall, MRI demonstrated a full-thickness tear of both the supraspinatus, as well as the infraspinatus with some retraction.  Muscle bulk was maintained.  Given her age, overall reliance on the left shoulder, as well as the appearance on MRI, I did recommend surgery.  I felt that we could proceed safely and reliably with a rotator cuff repair.  This was discussed with the patient.  The risks and benefits were explained at length including but not limited to continued pain, cuff failure, stiffness, need for further surgery and infection.  She elected proceed.  Surgical consent was finalized.   Procedure:   Patient was correctly identified in the preoperative holding area and operative site marked.  Patient brought to OR and positioned beachchair ensuring that all bony prominences were padded and the head was in an appropriate location.  Anesthesia was induced and the operative shoulder was prepped and draped in the usual sterile fashion.  Timeout was called preincision.  She received 3 g of Ancef  prior to making incision.  Her block was working effectively.  A standard posterior viewing portal was made after localizing the portal with a spinal  needle.  The glenohumeral joint was evaluated.  There was a large superior rotator cuff tear, with retraction, which was visible from within the joint.  The humeral  head and glenoid were both without significant cartilage damage.  There was a large Buford complex, with irritated tissue in this area.  The biceps tendon was evaluated and there was irritated tissue at the insertion.  There was no SLAP tear.  There was fraying and longitudinal tearing of the biceps tendon, noted within the intra-articular portion.  This was subsequently tenotomized with a arthroscopic basket.  We combination of shaver and electrocautery to clear the fragments of the biceps tendon, as well as fraying within the shoulder joint.  Subscapularis was intact.  No further pathology was identified within the joint.  The arthroscope was then inserted within the subacromial space.  Once again, we were able to identify a large tear which included both the supraspinatus as well as the infraspinatus.  At the anterior portion of the supraspinatus, there was a quart of tendinous tissue which remained attached to the footprint.  This likely splinted the remaining tendon, and prevented excessive retraction given the size of the tear.  I measured the tear at approximately 2.5 cm from anterior to posterior, and up to 2.5 cm of retraction.  It was a crescent shaped tear.  The footprint was prepared with a bone cutter.  There was some bleeding.  We used 2 percutaneous incisions to introduce a 2 medial row anchors.  These were loaded with 2 suture tapes, as well as a knotless mechanism.  The suture tapes were passed individually through the rotator cuff tendon, achieving an excellent bite.  Once we passed all 4 suture limbs from both the anterior and posterior anchors, we then passed the knotless mechanism anterior and posterior to the entire construct.  Knotless mechanisms were then crisscrossed to provide additional compression across the repaired tendon.  At this point, we passed an additional suture in order to provide traction while finalizing the repair.  We then took 1 suture from the anterior anchor, as  well as 1 suture from the posterior anchor and placed this into a swivel lock providing excellent compression of the repaired tendon to the footprint.  We pulled tension on the retention suture, in order to improve the overall reduction.  We then repeated the same process with another swivel lock anchor, and in the more posterior aspect of the lateral humeral head.  This provided excellent compression.  We then additionally tensioned the knotless mechanism of both medial row anchors, to provide additional compression of the repaired tendon at the footprint.  Overall, we had an excellent reduction.  The tendon was repaired.  All sutures had excellent tension and the humeral head was covered at the footprint.  Instruments and arthroscope were removed without issue.  The incisions were closed with 3-0 nylon.  A sterile dressing was placed along with a sling. The patient was awoken from general anesthesia and taken to the PACU in stable condition without complication.    Post-operative plan:  The patient will be non-weightbearing in a sling.   The patient will be discharged home. DVT prophylaxis will consist of 81 mg of aspirin  twice daily, for 6 weeks or until she is fully ambulatory Pain control with PRN pain medication preferring oral medicines.   Follow up plan will be scheduled in approximately 10-14 days for incision check and XR.

## 2024-03-06 NOTE — Anesthesia Procedure Notes (Signed)
 Anesthesia Regional Block: Interscalene brachial plexus block   Pre-Anesthetic Checklist: , timeout performed,  Correct Patient, Correct Site, Correct Laterality,  Correct Procedure, Correct Position, site marked,  Risks and benefits discussed,  Surgical consent,  Pre-op evaluation,  At surgeon's request and post-op pain management  Laterality: Left  Prep: chloraprep       Needles:  Injection technique: Single-shot  Needle Type: Stimiplex     Needle Length: 4cm  Needle Gauge: 22     Additional Needles:   Procedures:,,,, ultrasound used (permanent image in chart),,    Narrative:  Start time: 03/06/2024 7:27 AM End time: 03/06/2024 7:38 AM Injection made incrementally with aspirations every 5 mL. Anesthesiologist: Kendell Yvonna PARAS, MD CRNA: Pheobe Adine CROME, CRNA

## 2024-03-06 NOTE — Transfer of Care (Signed)
 Immediate Anesthesia Transfer of Care Note  Patient: Carrie  Mitchell  Procedure(s) Performed: REPAIR, ROTATOR CUFF, ARTHROSCOPIC (Left: Shoulder)  Patient Location: PACU  Anesthesia Type:General and Regional  Level of Consciousness: awake, alert , oriented, and patient cooperative  Airway & Oxygen Therapy: Patient Spontanous Breathing and Patient connected to nasal cannula oxygen  Post-op Assessment: Report given to RN and Post -op Vital signs reviewed and stable  Post vital signs: Reviewed and stable  Last Vitals:  Vitals Value Taken Time  BP 105/73 03/06/24 10:57  Temp    Pulse 94 03/06/24 10:59  Resp 23 03/06/24 11:02  SpO2 99 % 03/06/24 10:59  Vitals shown include unfiled device data.  Last Pain:  Vitals:   03/06/24 0705  TempSrc: Oral  PainSc: 0-No pain      Patients Stated Pain Goal: 5 (03/06/24 0705)  Complications: No notable events documented.

## 2024-03-06 NOTE — H&P (Signed)
 Below is the most recent clinic note for Carrie  Mitchell; any pertinent information regarding their recent medical history will be updated on the day of surgery.    New Patient Visit  Assessment: Carrie  Mitchell is a 54 y.o. female with the following: 1. Traumatic complete tear of left rotator cuff, initial encounter  Plan: Carrie  Mitchell fell a little over a month ago, and injured her left shoulder.  MRI was previously obtained, demonstrates full-thickness tears of the supraspinatus and the infraspinatus.  She is left-hand dominant.  She has a congenital deformity of the right hand.  She has limited motion, and a lot of pain in the left shoulder.  We discussed the MRI findings, as well as the challenges associated with her injury.  She states understanding.  I am recommending a left shoulder rotator cuff repair.  Will plan to proceed with arthroscopic repair, with the possibility of a mini open procedure.  Possibility of patch augmentation.  She is a diabetic.  She is scheduled to undergo updated labs later this week.  Most recent A1c was 8.2.  We discussed this as it could increase her risk for infection, as well as poor healing following surgery.  She states understanding.  Recovery has been discussed.  She will be 6 weeks in a sling.  Anticipate at least 6 months full recovery.  She works as a Clinical biochemist.  Risks and benefits of the surgery, including, but not limited to infection, bleeding, persistent pain, need for further surgery, rotator cuff failure, stiffness and more severe complications associated with anesthesia were discussed with the patient.  The patient has elected to proceed.  She takes Ozempic.  Okay to take the Ozempic on August 3.  Do not take Ozempic on August 10.  Plan to proceed with surgery on August 11.   Follow-up: Postop  Subjective:  Left shoulder pain  History of Present Illness: Carrie  Mitchell is a 53 y.o. female who presents for evaluation of shoulder pain.   She slipped and fell at home, June 20.  She landed directly on her left shoulder.  She had immediate pain.  She has difficulty overhead motion.  Pain gets worse at nighttime.  She is left-hand dominant.  She has a congenital deformity of the right hand.  She works as a Engineer, site.   Review of Systems: No fevers or chills No numbness or tingling No chest pain No shortness of breath No bowel or bladder dysfunction No GI distress No headaches   Medical History:  Past Medical History:  Diagnosis Date   Cellulitis    Cholelithiasis 03/18/2013   Dermatitis 02/16/2012   Diabetes mellitus without complication (HCC)    High cholesterol    Hypertension    Obesity    Prediabetes    Vitamin D  deficiency     Past Surgical History:  Procedure Laterality Date   BREAST SURGERY Right    cyst removal benign   CHOLECYSTECTOMY N/A 03/20/2013   Procedure: LAPAROSCOPIC CHOLECYSTECTOMY;  Surgeon: Thresa JAYSON Pulling, MD;  Location: [facility];  Service: General;  Laterality: N/A;   TUBAL LIGATION      Family History  Problem Relation Age of Onset   Diabetes Mother    Hypertension Mother    Stroke Mother    Heart attack Father    Crohn's disease Sister    Hypertension Brother    Hypertension Brother    Social History   Tobacco Use   Smoking status: Former    Types: Cigarettes  Smokeless tobacco: Never  Vaping Use   Vaping status: Never Used  Substance Use Topics   Alcohol use: No   Drug use: No    No Known Allergies  Current Meds  Medication Sig   acidophilus (RISAQUAD) CAPS capsule Take 1 capsule by mouth daily.   amLODipine  (NORVASC ) 5 MG tablet TAKE 1 TABLET(5 MG) BY MOUTH DAILY   metFORMIN  (GLUCOPHAGE -XR) 500 MG 24 hr tablet Take 500 mg by mouth daily with breakfast.   oxyCODONE -acetaminophen  (PERCOCET/ROXICET) 5-325 MG tablet Take 1 tablet by mouth every 4 (four) hours as needed. (Patient taking differently: Take 1 tablet by mouth every 4 (four) hours as needed for  severe pain (pain score 7-10).)   rosuvastatin  (CRESTOR ) 10 MG tablet Take 1 tablet (10 mg total) by mouth daily.   Semaglutide,0.25 or 0.5MG /DOS, (OZEMPIC, 0.25 OR 0.5 MG/DOSE,) 2 MG/1.5ML SOPN Inject 0.25 mg into the skin every 7 (seven) days.   traMADol (ULTRAM-ER) 100 MG 24 hr tablet Take 100 mg by mouth daily as needed for pain.   Vitamin D , Ergocalciferol , (DRISDOL ) 1.25 MG (50000 UNIT) CAPS capsule TAKE 1 CAPSULE BY MOUTH EVERY 7 DAYS    Objective: BP 116/66 (BP Location: Right Arm)   Pulse 75   Temp 98.2 F (36.8 C) (Oral)   Resp (!) 0   Ht 5' 9.5 (1.765 m)   Wt (!) 158.8 kg   LMP 06/29/2013   SpO2 98%   BMI 50.94 kg/m   Physical Exam:  General: Alert and oriented. and No acute distress. Gait: Normal gait.  Left shoulder without deformity.  Limited range of motion.  Forward flexion limited to 90 degrees.  Abduction limited to 80 degrees.  Fingers are warm and well-perfused.  IMAGING: I personally reviewed images previously obtained in clinic   MRI of the left shoulder was obtained at [facility].  Massive full-thickness supraspinatus and infraspinatus tendon tears with intact muscle belly volume.     Carrie DELENA Horde, MD  03/06/2024 7:55 AM

## 2024-03-06 NOTE — Anesthesia Procedure Notes (Signed)
 Procedure Name: Intubation Date/Time: 03/06/2024 8:07 AM  Performed by: Pheobe Adine CROME, CRNAPre-anesthesia Checklist: Patient identified, Emergency Drugs available, Suction available, Patient being monitored and Timeout performed Patient Re-evaluated:Patient Re-evaluated prior to induction Oxygen Delivery Method: Circle system utilized Preoxygenation: Pre-oxygenation with 100% oxygen Induction Type: IV induction Laryngoscope Size: Mac and 3 Grade View: Grade I Tube type: Oral Tube size: 7.0 mm Number of attempts: 1 Airway Equipment and Method: Stylet and Video-laryngoscopy Placement Confirmation: ETT inserted through vocal cords under direct vision, positive ETCO2, CO2 detector and breath sounds checked- equal and bilateral Secured at: 21 cm Tube secured with: Tape Dental Injury: Teeth and Oropharynx as per pre-operative assessment

## 2024-03-06 NOTE — Discharge Instructions (Addendum)
 Carrie A. Onesimo, MD MS [facility] [address] [place],  [place]  [address] Phone: [phone] Fax: [phone]    POST-OPERATIVE INSTRUCTIONS - SHOULDER ARTHROSCOPY  WOUND CARE You may remove the Operative Dressing on Post-Op Day #3 (72hrs after surgery).   Alternatively if you would like you can leave dressing on until follow-up if within 7-8 days but keep it dry. Leave steri-strips in place until they fall off on their own, usually 2 weeks postop. There may be a small amount of fluid/bleeding leaking at the surgical site. This is normal; the shoulder is filled with fluid during the procedure and can leak for 24-48hrs after surgery. ou may change/reinforce the bandage as needed.  Use the Cryocuff or Ice as often as possible for the first 7 days, then as needed for pain relief. Always keep a towel, ACE wrap or other barrier between the cooling unit and your skin.  You may shower on Post-Op Day #3. Gently pat the area dry. Do not soak the shoulder in water  or submerge it. Keep dry incisions as dry as possible. Do not go swimming in the pool or ocean until 4 weeks after surgery or when otherwise instructed.    EXERCISES/BRACING Sling should be used at all times until follow-up.  You can remove sling for hygiene.    Please continue to ambulate and do not stay sitting or lying for too long. Perform foot and wrist pumps to assist in circulation.  POST-OP MEDICATIONS- Multimodal approach to pain control In general your pain will be controlled with a combination of substances.  Prescriptions unless otherwise discussed are electronically sent to your pharmacy.  This is a carefully made plan we use to minimize narcotic use.    Meloxicam  OR Celebrex  - Anti-inflammatory medication taken on a scheduled basis Acetaminophen  - Non-narcotic pain medicine taken on a scheduled basis  Oxycodone  - This is a strong narcotic, to be used only on an "as needed" basis for  pain. Aspirin  81mg  - This medicine is used to minimize the risk of blood clots after surgery. Zofran  - take as needed for nausea   FOLLOW-UP If you develop a Fever (>=101.5), Redness or Drainage from the surgical incision site, please call our office to arrange for an evaluation. Please call the office to schedule a follow-up appointment for your suture removal, 10-14 days post-operatively.    HELPFUL INFORMATION  If you had a block, it will wear off between 8-24 hrs postop typically.  This is period when your pain may go from nearly zero to the pain you would have had postop without the block.  This is an abrupt transition but nothing dangerous is happening.  You may take an extra dose of narcotic when this happens.  You may be more comfortable sleeping in a semi-seated position the first few nights following surgery.  Keep a pillow propped under the elbow and forearm for comfort.  If you have a recliner type of chair it might be beneficial.  If not that is fine too, but it would be helpful to sleep propped up with pillows behind your operated shoulder as well under your elbow and forearm.  This will reduce pulling on the suture lines.  When dressing, put your operative arm in the sleeve first.  When getting undressed, take your operative arm out last.  Loose fitting, button-down shirts are recommended.  Often in the first days after surgery you may be more comfortable keeping your operative arm under  your shirt and not through the sleeve.  You may return to work/school in the next couple of days when you feel up to it.  Desk work and typing in the sling is     fine.  We suggest you use the pain medication the first night prior to going to bed, in order to ease any pain when the anesthesia wears off. You should avoid taking pain medications on an empty stomach as it will make you nauseous.  You should wean off your narcotic medicines as soon as you are able.  Most patients will be off or  using minimal narcotics before their first postop appointment.   Do not drink alcoholic beverages or take illicit drugs when taking pain medications.  It is against the law to drive while taking narcotics.  In some states it is against the law to drive while your arm is in a sling.   Pain medication may make you constipated.  Below are a few solutions to try in this order: Decrease the amount of pain medication if you aren't having pain. Drink lots of decaffeinated fluids. Drink prune juice and/or eat dried prunes  If the first 3 don't work start with additional solutions Take Colace - an over-the-counter stool softener Take Senokot - an over-the-counter laxative Take Miralax - a stronger over-the-counter laxative  NARCOTIC MANAGEMENT   Per [facility] clinic policy, our goal is ensure optimal postoperative pain control with a multimodal pain management strategy.   For all [facility] patients, our goal is to wean post-operative narcotic medications by 6 weeks post-operatively.   If this is not possible due to utilization of pain medication prior to surgery, your [facility] doctor will support your acute post-operative pain control for the first 6 weeks postoperatively, with a plan to transition you back to your primary pain team following that. Maralee will work to ensure a Therapist, occupational.

## 2024-03-06 NOTE — Progress Notes (Signed)
 Left shoulder nerve block performed by [facility] CRNA. Dr.Kiel in to observe. Start of block 0727, end time [phone]. Pt tolerated well.

## 2024-03-07 ENCOUNTER — Encounter (HOSPITAL_COMMUNITY): Payer: Self-pay | Admitting: Orthopedic Surgery

## 2024-03-07 NOTE — Anesthesia Postprocedure Evaluation (Signed)
 Anesthesia Post Note  Patient: Carrie Mitchell  Snyders  Procedure(s) Performed: REPAIR, ROTATOR CUFF, ARTHROSCOPIC (Left: Shoulder)  Patient location during evaluation: Phase II Anesthesia Type: General Level of consciousness: awake Pain management: pain level controlled Vital Signs Assessment: post-procedure vital signs reviewed and stable Respiratory status: spontaneous breathing and respiratory function stable Cardiovascular status: blood pressure returned to baseline and stable Postop Assessment: no headache and no apparent nausea or vomiting Anesthetic complications: no Comments: Late entry   No notable events documented.   Last Vitals:  Vitals:   03/06/24 1145 03/06/24 1154  BP: 119/70 138/84  Pulse: 86 68  Resp: (!) 21   Temp: 36.6 C 36.7 C  SpO2: 97% 97%    Last Pain:  Vitals:   03/06/24 1154  TempSrc: Oral  PainSc: 5                  Yvonna JINNY Bosworth

## 2024-03-21 ENCOUNTER — Encounter: Payer: Self-pay | Admitting: Orthopedic Surgery

## 2024-03-21 ENCOUNTER — Ambulatory Visit (INDEPENDENT_AMBULATORY_CARE_PROVIDER_SITE_OTHER): Admitting: Orthopedic Surgery

## 2024-03-21 DIAGNOSIS — S46012D Strain of muscle(s) and tendon(s) of the rotator cuff of left shoulder, subsequent encounter: Secondary | ICD-10-CM

## 2024-03-21 MED ORDER — OXYCODONE HCL 5 MG PO TABS: 5.0000 mg | ORAL_TABLET | ORAL | 0 refills | Status: AC | PRN

## 2024-03-21 NOTE — Progress Notes (Signed)
 Orthopaedic Postop Note  Assessment: Carrie  Mitchell is a 53 y.o. female s/p left shoulder rotator cuff repair for massive tear  DOS: 03/06/24  Plan: Sutures removed, steri strips placed Procedure reviewed Continue to use the sling at all times.  Okay to remove the pillow. Okay to remove the sling for hygiene and gentle motion of the elbow, wrist and hand. Refill of pain medications has been provided Urged her to walk is much as possible Follow-up in 3 weeks   Follow-up: Return in about 3 weeks (around 04/11/2024). XR at next visit: None  Subjective:  Chief Complaint  Patient presents with   Routine Post Op    L RCR DOS 03/06/24    History of Present Illness: Carrie  Mitchell is a 53 y.o. female who presents following the above stated procedure.  She has done fairly well following surgery.  Pain has been controlled.  She does note some itching.  She mostly takes pain medicine at night.  She has remained in the sling.  She did have some swelling after surgery, but this is improved as she has started to walk more.  No numbness or tingling.  Review of Systems: No fevers or chills No numbness or tingling No Chest Pain No shortness of breath Itching   Objective: LMP 06/29/2013   Physical Exam:  Alert and oriented.  No acute distress  Surgical incisions are healing.  No surrounding erythema or drainage.  No point tenderness.  Sensation is intact in the axillary nerve distribution.  Sensation intact in the left hand.  She tolerates gentle range of motion of the shoulder.  She can achieve full extension of the elbow.  IMAGING: I personally ordered and reviewed the following images:  No new imaging obtained today.  Oneil DELENA Horde, MD 03/21/2024 8:54 AM

## 2024-03-22 DIAGNOSIS — R6889 Other general symptoms and signs: Secondary | ICD-10-CM | POA: Diagnosis not present

## 2024-04-11 ENCOUNTER — Ambulatory Visit: Admitting: Orthopedic Surgery

## 2024-04-11 ENCOUNTER — Encounter: Payer: Self-pay | Admitting: Orthopedic Surgery

## 2024-04-11 DIAGNOSIS — S46012D Strain of muscle(s) and tendon(s) of the rotator cuff of left shoulder, subsequent encounter: Secondary | ICD-10-CM

## 2024-04-11 NOTE — Patient Instructions (Signed)
 Sling as needed  Medications as needed  Referral for therapy  Follow-up in 6 weeks

## 2024-04-11 NOTE — Addendum Note (Signed)
 Addended by: VICENTA EMMIE HERO on: 04/11/2024 09:16 AM   Modules accepted: Orders

## 2024-04-11 NOTE — Progress Notes (Signed)
 Orthopaedic Postop Note  Assessment: Wetona  Fok is a 53 y.o. female s/p left shoulder rotator cuff repair for massive tear  DOS: 03/06/24  Plan: Mrs. Lyn is progressing appropriately.  She is now approximately 5 weeks out from surgery.  Will plan to place a referral for physical therapy.  Is okay for her to start transitioning out of the sling.  She did tolerate gentle range of motion in clinic today.  Medications as needed.  Follow-up in 6 weeks.  Follow-up: Return in about 6 weeks (around 05/23/2024). XR at next visit: None  Subjective:  Chief Complaint  Patient presents with   Post-op Follow-up    Improving s/p left RCR     History of Present Illness: Reshunda  Clarin is a 53 y.o. female who presents following the above stated procedure.  Surgery was approximately 5 weeks ago.  She has done well.  She has remained in a sling.  She is been doing gentle exercises, working on motion of the left elbow, wrist and fingers.  She is using her stress ball.  She takes medications occasionally.  No numbness or tingling.  She is ready to proceed with therapy.   Review of Systems: No fevers or chills No numbness or tingling No Chest Pain No shortness of breath Itching   Objective: LMP 06/29/2013   Physical Exam:  Alert and oriented.  No acute distress  Surgical incisions of healed.  No surrounding erythema or drainage.  Mild sensitivity over the anterior portal incision.  She tolerates passive forward flexion to 100 degrees.  Passive abduction to 75 degrees.  Passive external rotation or side to 25 degrees.  Fingers are warm and well-perfused.  Sensation intact throughout the left hand.  IMAGING: I personally ordered and reviewed the following images:  No new imaging obtained today.  Oneil DELENA Horde, MD 04/11/2024 9:10 AM

## 2024-04-17 ENCOUNTER — Ambulatory Visit (HOSPITAL_COMMUNITY): Attending: Orthopedic Surgery | Admitting: Occupational Therapy

## 2024-04-17 ENCOUNTER — Encounter (HOSPITAL_COMMUNITY): Payer: Self-pay | Admitting: Occupational Therapy

## 2024-04-17 DIAGNOSIS — M25611 Stiffness of right shoulder, not elsewhere classified: Secondary | ICD-10-CM | POA: Insufficient documentation

## 2024-04-17 DIAGNOSIS — R29898 Other symptoms and signs involving the musculoskeletal system: Secondary | ICD-10-CM | POA: Diagnosis not present

## 2024-04-17 DIAGNOSIS — S46012D Strain of muscle(s) and tendon(s) of the rotator cuff of left shoulder, subsequent encounter: Secondary | ICD-10-CM | POA: Insufficient documentation

## 2024-04-17 DIAGNOSIS — M25511 Pain in right shoulder: Secondary | ICD-10-CM | POA: Insufficient documentation

## 2024-04-17 NOTE — Therapy (Signed)
 OUTPATIENT OCCUPATIONAL THERAPY ORTHO EVALUATION  Patient Name: Carrie Mitchell MRN: [id number] DOB:Sep 11, 1970, 53 y.o., female Today's Date: 04/17/2024   END OF SESSION:  OT End of Session - 04/17/24 0920     Visit Number 1    Number of Visits 16    Date for Recertification  06/17/24    Authorization Type BCBS    Authorization Time Period Requesting 16 visits    Authorization - Visit Number 1    Authorization - Number of Visits 16    OT Start Time 0818    OT Stop Time 0857    OT Time Calculation (min) 39 min    Activity Tolerance Patient tolerated treatment well    Behavior During Therapy [facility] for tasks assessed/performed          Past Medical History:  Diagnosis Date   Cellulitis    Cholelithiasis 03/18/2013   Dermatitis 02/16/2012   Diabetes mellitus without complication (HCC)    High cholesterol    Hypertension    Obesity    Prediabetes    Vitamin D  deficiency    Past Surgical History:  Procedure Laterality Date   ARTHOSCOPIC ROTAOR CUFF REPAIR Left 03/06/2024   Procedure: REPAIR, ROTATOR CUFF, ARTHROSCOPIC;  Surgeon: Onesimo Oneil LABOR, MD;  Location: [facility];  Service: Orthopedics;  Laterality: Left;   BREAST SURGERY Right    cyst removal benign   CHOLECYSTECTOMY N/A 03/20/2013   Procedure: LAPAROSCOPIC CHOLECYSTECTOMY;  Surgeon: Thresa JAYSON Pulling, MD;  Location: [facility];  Service: General;  Laterality: N/A;   TUBAL LIGATION     Patient Active Problem List   Diagnosis Date Noted   Pain in left arm 12/14/2023   Hypertension 05/13/2023   Dysuria 04/17/2023   Chronic right shoulder pain 04/17/2023   Elevated BP without diagnosis of hypertension 05/30/2022   Cellulitis 01/30/2014   Urinary incontinence, urge 07/20/2013   Acute renal insufficiency 03/18/2013   Cholelithiasis 03/18/2013   Abdominal pain, right upper quadrant 03/18/2013   Cellulitis of leg, right 03/18/2013   Chest pain 03/17/2013   Nausea & vomiting 03/17/2013   Dermatitis 02/16/2012    Hypokalemia 02/14/2012   Obesity 11/09/2011   Degenerative joint disease of right knee 04/25/2009    PCP: Shona Norleen PEDLAR, MD REFERRING PROVIDER: Onesimo Oneil, MD  ONSET DATE: 03/06/24  REFERRING DIAG:  D53.987I (ICD-10-CM) - Traumatic complete tear of left rotator cuff, subsequent encounter    THERAPY DIAG:  Acute pain of right shoulder - Plan: Ot plan of care cert/re-cert  Shoulder stiffness, right - Plan: Ot plan of care cert/re-cert  Other symptoms and signs involving the musculoskeletal system - Plan: Ot plan of care cert/re-cert  Rationale for Evaluation and Treatment: Rehabilitation  SUBJECTIVE:   SUBJECTIVE STATEMENT: I just pushed up on it and it hurts bad Pt accompanied by: self  PERTINENT HISTORY: Pt had a fall in June 2025, where she tore her L RC. She is now s/p Massive RCR. 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  PRECAUTIONS: Shoulder  WEIGHT BEARING RESTRICTIONS: Yes 1-2#  PAIN:  Are you having pain? Yes: NPRS scale: 4/10 Pain location: Anterior shoulder girdle Pain description: aching Aggravating factors: moving Relieving factors: medication  FALLS: Has patient fallen in last 6 months? Yes. Number of falls 1  PLOF: Independent  PATIENT GOALS: To get all independence back  NEXT MD VISIT: 05/23/24  OBJECTIVE:   HAND DOMINANCE: Left  ADLs: Overall ADLs: Pt requiring max assist with all dressing  and bathing. Unable to cook or clean at this time.   FUNCTIONAL OUTCOME MEASURES: Upper Extremity Functional Scale (UEFS): 12/80   Extreme difficulty/unable (0), Quite a bit of difficulty (1), Moderate difficulty (2), Little difficulty (3), No difficulty (4) Survey date:  04/17/24  Any of your usual work, household or school activities 1  2. Your usual hobbies, recreational/sport activities 1   3. Lifting a bag of groceries to waist level 1   4. Lifting a bag of groceries above your head 0  5. Grooming your hair 0  6. Pushing  up on your hands (I.e. from bathtub or chair) 1  7. Preparing food (I.e. peeling/cutting) 0  8. Driving  2  9. Vacuuming, sweeping, or raking 0  10. Dressing  1  11. Doing up buttons 2  12. Using tools/appliances 1  13. Opening doors 1  14. Cleaning  0  15. Tying or lacing shoes 0  16. Sleeping  1  17. Laundering clothes (I.e. washing, ironing, folding) 0  18. Opening a jar 0  19. Throwing a ball 0  20. Carrying a small suitcase with your affected limb.  0  Score total:  12/80     UPPER EXTREMITY ROM:       Assessed in seated, er/IR adducted  Passive ROM Left eval  Shoulder flexion 91  Shoulder abduction 95  Shoulder internal rotation 90  Shoulder external rotation 27  (Blank rows = not tested)    UPPER EXTREMITY MMT:     Assessed in seated, er/IR adducted  MMT Left eval  Shoulder flexion   Shoulder abduction   Shoulder internal rotation   Shoulder external rotation   (Blank rows = not tested)  HAND FUNCTION: Grip strength: Left: 14 lbs  SENSATION: WFL  EDEMA: No swelling noted  OBSERVATIONS: Pt has moderate to severe fascial restictions along her bicep, scapular region, and trapezius.    TODAY'S TREATMENT:                                                                                                                              DATE:   04/17/24: -Wrist ROM: flexion, extension, ulnar/radial deviation, supination/pronation, x10 -Table Slides: flexion, abduction, er, x10 -Pendulums: 2x30    PATIENT EDUCATION: Education details: Wrist ROM, Table Slides, Pendulums Person educated: Patient Education method: Explanation, Demonstration, and Handouts Education comprehension: verbalized understanding and returned demonstration  HOME EXERCISE PROGRAM: 10/27: Wrist ROM, table slides, pendulums  GOALS: Goals reviewed with patient? Yes   SHORT TERM GOALS: Target date: 05/18/24  Pt will be provided with and educated on HEP to improve mobility in LUE  required for use during ADL completion.   Goal status: INITIAL  2.  Pt will increase LUE P/ROM by 40 degrees to improve ability to use LUE during dressing tasks with minimal compensatory techniques.   Goal status: INITIAL  3.  Pt will increase LUE strength to 3+/5 to improve ability to reach for  items at waist to chest height during bathing and grooming tasks.   Goal status: INITIAL  4. Pt will increase LUE grip strength by 20lbs in order to grasp and hold items needed for cooking and cleaning.    Goal status: INITIAL   LONG TERM GOALS: Target date: 06/17/24  Pt will decrease pain in LUE to 3/10 or less to improve ability to sleep for 2+ consecutive hours without waking due to pain.   Goal status: INITIAL  2.  Pt will decrease LUE fascial restrictions to min amounts or less to improve mobility required for functional reaching tasks.   Goal status: INITIAL  3.  Pt will increase LUE A/ROM by 50 degrees to improve ability to use LUE when reaching overhead or behind back during dressing and bathing tasks.   Goal status: INITIAL  4.  Pt will increase LUE strength to 4+/5 or greater to improve ability to use LUE when lifting or carrying items during meal preparation/housework/yardwork tasks.   Goal status: INITIAL  5.  Pt will return to highest level of function using LUE as dominant/non-dominant during functional task completion.   Goal status: INITIAL   ASSESSMENT:  CLINICAL IMPRESSION: Patient is a 53 y.o. female who was seen today for occupational therapy evaluation for L RCR following massive tears. Pt presents with increased pain and fascial restrictions, decreased ROM, strength, and functional use of the LUE.   PERFORMANCE DEFICITS: in functional skills including in functional skills including ADLs, IADLs, coordination, tone, ROM, strength, pain, fascial restrictions, muscle spasms, and UE functional use.  IMPAIRMENTS: are limiting patient from ADLs, IADLs, rest and  sleep, work, leisure, and social participation.   COMORBIDITIES: may have co-morbidities  that affects occupational performance. Patient will benefit from skilled OT to address above impairments and improve overall function.  MODIFICATION OR ASSISTANCE TO COMPLETE EVALUATION: Min-Moderate modification of tasks or assist with assess necessary to complete an evaluation.  OT OCCUPATIONAL PROFILE AND HISTORY: Detailed assessment: Review of records and additional review of physical, cognitive, psychosocial history related to current functional performance.  CLINICAL DECISION MAKING: Moderate - several treatment options, min-mod task modification necessary  REHAB POTENTIAL: Good  EVALUATION COMPLEXITY: Moderate      PLAN:  OT FREQUENCY: 2x/week  OT DURATION: 8 weeks  PLANNED INTERVENTIONS: 97168 OT Re-evaluation, 97535 self care/ADL training, 02889 therapeutic exercise, 97530 therapeutic activity, 97112 neuromuscular re-education, 97140 manual therapy, 97035 ultrasound, 97010 moist heat, 97032 electrical stimulation (manual), passive range of motion, functional mobility training, energy conservation, and coping strategies training  RECOMMENDED OTHER SERVICES: N/A  CONSULTED AND AGREED WITH PLAN OF CARE: Patient  PLAN FOR NEXT SESSION: Manual Therapy, P/ROM, provide theraputty   Valentin Nightingale, OTR/L [facility] [phone] [place], OT 15-May-2024, 9:24 AM    Managed Medicaid Authorization Request Treatment Start Date: 10/27/225  Visit Dx Codes: M25.511, M25.611, R29.898  Functional Tool Score: UEFS: 06-23-79  For all possible CPT codes, reference the Planned Interventions line above.     Check all conditions that are expected to impact treatment: {Conditions expected to impact treatment:None of these apply   If treatment provided at initial evaluation, no treatment charged due to lack of authorization.

## 2024-04-17 NOTE — Patient Instructions (Signed)
 AROM Exercises   1) Wrist Flexion  Start with wrist at edge of table, palm facing up. With wrist hanging slightly off table, curl wrist upward, and back down.      2) Wrist Extension  Start with wrist at edge of table, palm facing down. With wrist slightly off the edge of the table, curl wrist up and back down.      3) Radial Deviations  Start with forearm flat against a table, wrist hanging slightly off the edge, and palm facing the wall. Bending at the wrist only, and keeping palm facing the wall, bend wrist so fist is pointing towards the floor, back up to start position, and up towards the ceiling. Return to start.        4) WRIST PRONATION  Turn your forearm towards palm face down.  Keep your elbow bent and by the side of your  Body.      5) WRIST SUPINATION  Turn your forearm towards palm face up.  Keep your elbow bent and by the side of your  Body.      *Complete exercises ______ times each, _______ times per day*      Strengthening Exercises  1) WRIST EXTENSION CURLS - TABLE  Hold a small free weight, rest your forearm on a table and bend your wrist up and down with your palm face down as shown.      2) WRIST FLEXION CURLS - TABLE  Hold a small free weight, rest your forearm on a table and bend your wrist up and down with your palm face up as shown.     3) FREE WEIGHT RADIAL/ULNAR DEVIATION - TABLE  Hold a small free weight, rest your forearm on a table and bend your wrist up and down with your palm facing towards the side as shown.     4) Pronation  Forearm supported on table with wrist in neutral position. Using a weight, roll wrist so that palm faces downward. Hold for 2 seconds and return to starting position.     5) Supination  Forearm supported on table with wrist in neutral position. Using a weight, roll wrist so that palm is now facing upward. Hold for 2 seconds and return to starting position.      *Complete  exercises using ____ pound weight, ____times each, ____times per day*    1) SHOULDER: Flexion On Table   Place hands on towel placed on table, elbows straight. Lean forward with you upper body, pushing towel away from body.  ___ reps per set, ___ sets per day  2) Abduction (Passive)   With arm out to side, resting on towel placed on table with palm DOWN, keeping trunk away from table, lean to the side while pushing towel away from body.  Repeat ____ times. Do ____ sessions per day.  Copyright  VHI. All rights reserved.     3) Internal Rotation (Assistive)   Seated with elbow bent at right angle and held against side, slide arm on table surface in an inward arc keeping elbow anchored in place. Repeat ____ times. Do ____ sessions per day. Activity: Use this motion to brush crumbs off the table.  Copyright  VHI. All rights reserved.

## 2024-04-22 DIAGNOSIS — R6889 Other general symptoms and signs: Secondary | ICD-10-CM | POA: Diagnosis not present

## 2024-04-24 ENCOUNTER — Encounter: Payer: Self-pay | Admitting: Radiology

## 2024-05-05 ENCOUNTER — Ambulatory Visit (HOSPITAL_COMMUNITY): Attending: Orthopedic Surgery | Admitting: Occupational Therapy

## 2024-05-05 ENCOUNTER — Encounter (HOSPITAL_COMMUNITY): Payer: Self-pay | Admitting: Occupational Therapy

## 2024-05-05 DIAGNOSIS — M25511 Pain in right shoulder: Secondary | ICD-10-CM | POA: Diagnosis not present

## 2024-05-05 DIAGNOSIS — M25611 Stiffness of right shoulder, not elsewhere classified: Secondary | ICD-10-CM | POA: Insufficient documentation

## 2024-05-05 DIAGNOSIS — R29898 Other symptoms and signs involving the musculoskeletal system: Secondary | ICD-10-CM | POA: Insufficient documentation

## 2024-05-05 NOTE — Therapy (Signed)
 OUTPATIENT OCCUPATIONAL THERAPY ORTHO TREATMENT NOTE  Patient Name: Carrie Mitchell MRN: [id number] DOB:November 22, 1970, 53 y.o., female Today's Date: 05/05/2024   END OF SESSION:  OT End of Session - 05/05/24 0902     Visit Number 2    Number of Visits 16    Date for Recertification  06/17/24    Authorization Type BCBS    Authorization Time Period 10 visits (04/17/24-07/16/23)    Authorization - Visit Number 2    Authorization - Number of Visits 10    OT Start Time [phone]    OT Stop Time 0902    OT Time Calculation (min) 45 min    Activity Tolerance Patient tolerated treatment well    Behavior During Therapy [facility] for tasks assessed/performed          Past Medical History:  Diagnosis Date   Cellulitis    Cholelithiasis 03/18/2013   Dermatitis 02/16/2012   Diabetes mellitus without complication (HCC)    High cholesterol    Hypertension    Obesity    Prediabetes    Vitamin D  deficiency    Past Surgical History:  Procedure Laterality Date   ARTHOSCOPIC ROTAOR CUFF REPAIR Left 03/06/2024   Procedure: REPAIR, ROTATOR CUFF, ARTHROSCOPIC;  Surgeon: Onesimo Oneil LABOR, MD;  Location: [facility];  Service: Orthopedics;  Laterality: Left;   BREAST SURGERY Right    cyst removal benign   CHOLECYSTECTOMY N/A 03/20/2013   Procedure: LAPAROSCOPIC CHOLECYSTECTOMY;  Surgeon: Thresa JAYSON Pulling, MD;  Location: [facility];  Service: General;  Laterality: N/A;   TUBAL LIGATION     Patient Active Problem List   Diagnosis Date Noted   Pain in left arm 12/14/2023   Hypertension 05/13/2023   Dysuria 04/17/2023   Chronic right shoulder pain 04/17/2023   Elevated BP without diagnosis of hypertension 05/30/2022   Cellulitis 01/30/2014   Urinary incontinence, urge 07/20/2013   Acute renal insufficiency 03/18/2013   Cholelithiasis 03/18/2013   Abdominal pain, right upper quadrant 03/18/2013   Cellulitis of leg, right 03/18/2013   Chest pain 03/17/2013   Nausea & vomiting 03/17/2013   Dermatitis 02/16/2012    Hypokalemia 02/14/2012   Obesity 11/09/2011   Degenerative joint disease of right knee 04/25/2009    PCP: Shona Norleen PEDLAR, MD REFERRING PROVIDER: Onesimo Oneil, MD  ONSET DATE: 03/06/24  REFERRING DIAG:  D53.987I (ICD-10-CM) - Traumatic complete tear of left rotator cuff, subsequent encounter    THERAPY DIAG:  Acute pain of right shoulder  Shoulder stiffness, right  Other symptoms and signs involving the musculoskeletal system  Rationale for Evaluation and Treatment: Rehabilitation  SUBJECTIVE:   SUBJECTIVE STATEMENT: I want to be able to lift it above my head Pt accompanied by: self  PERTINENT HISTORY: Pt had a fall in June 2025, where she tore her L RC. She is now s/p Massive RCR. 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  PRECAUTIONS: Shoulder  WEIGHT BEARING RESTRICTIONS: Yes 1-2#  PAIN:  Are you having pain? Yes: NPRS scale: 4/10 Pain location: Anterior shoulder girdle Pain description: aching Aggravating factors: moving Relieving factors: medication  FALLS: Has patient fallen in last 6 months? Yes. Number of falls 1  PLOF: Independent  PATIENT GOALS: To get all independence back  NEXT MD VISIT: 05/23/24  OBJECTIVE:   HAND DOMINANCE: Left  ADLs: Overall ADLs: Pt requiring max assist with all dressing and bathing. Unable to cook or clean at this time.   FUNCTIONAL OUTCOME MEASURES: Upper Extremity Functional Scale (  UEFS): 12/80   Extreme difficulty/unable (0), Quite a bit of difficulty (1), Moderate difficulty (2), Little difficulty (3), No difficulty (4) Survey date:  04/17/24  Any of your usual work, household or school activities 1  2. Your usual hobbies, recreational/sport activities 1   3. Lifting a bag of groceries to waist level 1   4. Lifting a bag of groceries above your head 0  5. Grooming your hair 0  6. Pushing up on your hands (I.e. from bathtub or chair) 1  7. Preparing food (I.e. peeling/cutting) 0  8.  Driving  2  9. Vacuuming, sweeping, or raking 0  10. Dressing  1  11. Doing up buttons 2  12. Using tools/appliances 1  13. Opening doors 1  14. Cleaning  0  15. Tying or lacing shoes 0  16. Sleeping  1  17. Laundering clothes (I.e. washing, ironing, folding) 0  18. Opening a jar 0  19. Throwing a ball 0  20. Carrying a small suitcase with your affected limb.  0  Score total:  12/80     UPPER EXTREMITY ROM:       Assessed in seated, er/IR adducted  Passive ROM Left eval  Shoulder flexion 91  Shoulder abduction 95  Shoulder internal rotation 90  Shoulder external rotation 27  (Blank rows = not tested)    UPPER EXTREMITY MMT:     Assessed in seated, er/IR adducted  MMT Left eval  Shoulder flexion   Shoulder abduction   Shoulder internal rotation   Shoulder external rotation   (Blank rows = not tested)  HAND FUNCTION: Grip strength: Left: 14 lbs  SENSATION: WFL  EDEMA: No swelling noted  OBSERVATIONS: Pt has moderate to severe fascial restictions along her bicep, scapular region, and trapezius.    TODAY'S TREATMENT:                                                                                                                              DATE:   05/05/24 -Manual therapy: myofascial release and trigger point applied to biceps, scapular region, and trapezius in order to reduce fascial restrictions and pain, as well as improve ROM -P/ROM: supine - flexion, abduction, horizontal abduction, er/IR, x10 -Wall Washes: 2x60 -Thumbtacs: 2x60 -Pendulums: 2x30  04/17/24: -Wrist ROM: flexion, extension, ulnar/radial deviation, supination/pronation, x10 -Table Slides: flexion, abduction, er, x10 -Pendulums: 2x30    PATIENT EDUCATION: Education details: Continue HEP Person educated: Patient Education method: Programmer, Multimedia, Demonstration, and Handouts Education comprehension: verbalized understanding and returned demonstration  HOME EXERCISE PROGRAM: 10/27:  Wrist ROM, table slides, pendulums  GOALS: Goals reviewed with patient? Yes   SHORT TERM GOALS: Target date: 05/18/24  Pt will be provided with and educated on HEP to improve mobility in LUE required for use during ADL completion.   Goal status: IN PROGRESS  2.  Pt will increase LUE P/ROM by 40 degrees to improve ability to use LUE during dressing tasks with  minimal compensatory techniques.   Goal status: IN PROGRESS  3.  Pt will increase LUE strength to 3+/5 to improve ability to reach for items at waist to chest height during bathing and grooming tasks.   Goal status: IN PROGRESS  4. Pt will increase LUE grip strength by 20lbs in order to grasp and hold items needed for cooking and cleaning.    Goal status: IN PROGRESS   LONG TERM GOALS: Target date: 06/17/24  Pt will decrease pain in LUE to 3/10 or less to improve ability to sleep for 2+ consecutive hours without waking due to pain.   Goal status: IN PROGRESS  2.  Pt will decrease LUE fascial restrictions to min amounts or less to improve mobility required for functional reaching tasks.   Goal status: IN PROGRESS  3.  Pt will increase LUE A/ROM by 50 degrees to improve ability to use LUE when reaching overhead or behind back during dressing and bathing tasks.   Goal status: IN PROGRESS  4.  Pt will increase LUE strength to 4+/5 or greater to improve ability to use LUE when lifting or carrying items during meal preparation/housework/yardwork tasks.   Goal status: IN PROGRESS  5.  Pt will return to highest level of function using LUE as dominant/non-dominant during functional task completion.   Goal status: IN PROGRESS   ASSESSMENT:  CLINICAL IMPRESSION: This session pt presents with decreased pain and improving ROM. She tolerated all passive ROM well to 70% of full ROM. She did fatigue with stability low level exercises requiring rest breaks between each exercise. Verbal and tactile cuing provided for positioning  and technique throughout session.   PERFORMANCE DEFICITS: in functional skills including in functional skills including ADLs, IADLs, coordination, tone, ROM, strength, pain, fascial restrictions, muscle spasms, and UE functional use.   PLAN:  OT FREQUENCY: 2x/week  OT DURATION: 8 weeks  PLANNED INTERVENTIONS: 97168 OT Re-evaluation, 97535 self care/ADL training, 02889 therapeutic exercise, 97530 therapeutic activity, 97112 neuromuscular re-education, 97140 manual therapy, 97035 ultrasound, 97010 moist heat, 97032 electrical stimulation (manual), passive range of motion, functional mobility training, energy conservation, and coping strategies training  RECOMMENDED OTHER SERVICES: N/A  CONSULTED AND AGREED WITH PLAN OF CARE: Patient  PLAN FOR NEXT SESSION: Manual Therapy, P/ROM, provide theraputty   Valentin Nightingale, OTR/L [facility] [phone], OT 05/05/2024, 6:04 PM    Managed Medicaid Authorization Request Treatment Start Date: 10/27/225  Visit Dx Codes: M25.511, M25.611, R29.898  Functional Tool Score: UEFS: 13-Jun-1979  For all possible CPT codes, reference the Planned Interventions line above.     Check all conditions that are expected to impact treatment: {Conditions expected to impact treatment:None of these apply   If treatment provided at initial evaluation, no treatment charged due to lack of authorization.

## 2024-05-09 ENCOUNTER — Ambulatory Visit (HOSPITAL_COMMUNITY): Admitting: Occupational Therapy

## 2024-05-09 ENCOUNTER — Encounter (HOSPITAL_COMMUNITY): Payer: Self-pay | Admitting: Occupational Therapy

## 2024-05-09 DIAGNOSIS — M25511 Pain in right shoulder: Secondary | ICD-10-CM

## 2024-05-09 DIAGNOSIS — R29898 Other symptoms and signs involving the musculoskeletal system: Secondary | ICD-10-CM | POA: Diagnosis not present

## 2024-05-09 DIAGNOSIS — M25611 Stiffness of right shoulder, not elsewhere classified: Secondary | ICD-10-CM | POA: Diagnosis not present

## 2024-05-09 NOTE — Therapy (Signed)
 OUTPATIENT OCCUPATIONAL THERAPY ORTHO TREATMENT NOTE  Patient Name: Carrie Mitchell MRN: [id number] DOB:04-18-1971, 53 y.o., female Today's Date: 05/09/2024   END OF SESSION:  OT End of Session - 05/09/24 0908     Visit Number 3    Number of Visits 16    Date for Recertification  06/17/24    Authorization Type BCBS    Authorization Time Period 10 visits (04/17/24-07/16/23)    Authorization - Visit Number 3    Authorization - Number of Visits 10    OT Start Time 0822    OT Stop Time 0901    OT Time Calculation (min) 39 min    Activity Tolerance Patient tolerated treatment well    Behavior During Therapy [facility] for tasks assessed/performed          Past Medical History:  Diagnosis Date   Cellulitis    Cholelithiasis 03/18/2013   Dermatitis 02/16/2012   Diabetes mellitus without complication (HCC)    High cholesterol    Hypertension    Obesity    Prediabetes    Vitamin D  deficiency    Past Surgical History:  Procedure Laterality Date   ARTHOSCOPIC ROTAOR CUFF REPAIR Left 03/06/2024   Procedure: REPAIR, ROTATOR CUFF, ARTHROSCOPIC;  Surgeon: Onesimo Oneil LABOR, MD;  Location: [facility];  Service: Orthopedics;  Laterality: Left;   BREAST SURGERY Right    cyst removal benign   CHOLECYSTECTOMY N/A 03/20/2013   Procedure: LAPAROSCOPIC CHOLECYSTECTOMY;  Surgeon: Thresa JAYSON Pulling, MD;  Location: [facility];  Service: General;  Laterality: N/A;   TUBAL LIGATION     Patient Active Problem List   Diagnosis Date Noted   Pain in left arm 12/14/2023   Hypertension 05/13/2023   Dysuria 04/17/2023   Chronic right shoulder pain 04/17/2023   Elevated BP without diagnosis of hypertension 05/30/2022   Cellulitis 01/30/2014   Urinary incontinence, urge 07/20/2013   Acute renal insufficiency 03/18/2013   Cholelithiasis 03/18/2013   Abdominal pain, right upper quadrant 03/18/2013   Cellulitis of leg, right 03/18/2013   Chest pain 03/17/2013   Nausea & vomiting 03/17/2013   Dermatitis 02/16/2012    Hypokalemia 02/14/2012   Obesity 11/09/2011   Degenerative joint disease of right knee 04/25/2009    PCP: Shona Norleen PEDLAR, MD REFERRING PROVIDER: Onesimo Oneil, MD  ONSET DATE: 03/06/24  REFERRING DIAG:  D53.987I (ICD-10-CM) - Traumatic complete tear of left rotator cuff, subsequent encounter    THERAPY DIAG:  Acute pain of right shoulder  Shoulder stiffness, right  Other symptoms and signs involving the musculoskeletal system  Rationale for Evaluation and Treatment: Rehabilitation  SUBJECTIVE:   SUBJECTIVE STATEMENT: I want to be able to lift it above my head Pt accompanied by: self  PERTINENT HISTORY: Pt had a fall in June 2025, where she tore her L RC. She is now s/p Massive RCR. 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  PRECAUTIONS: Shoulder  WEIGHT BEARING RESTRICTIONS: Yes 1-2#  PAIN:  Are you having pain? Yes: NPRS scale: 4/10 Pain location: Anterior shoulder girdle Pain description: aching Aggravating factors: moving Relieving factors: medication  FALLS: Has patient fallen in last 6 months? Yes. Number of falls 1  PLOF: Independent  PATIENT GOALS: To get all independence back  NEXT MD VISIT: 05/23/24  OBJECTIVE:   HAND DOMINANCE: Left  ADLs: Overall ADLs: Pt requiring max assist with all dressing and bathing. Unable to cook or clean at this time.   FUNCTIONAL OUTCOME MEASURES: Upper Extremity Functional Scale (  UEFS): 12/80   Extreme difficulty/unable (0), Quite a bit of difficulty (1), Moderate difficulty (2), Little difficulty (3), No difficulty (4) Survey date:  04/17/24  Any of your usual work, household or school activities 1  2. Your usual hobbies, recreational/sport activities 1   3. Lifting a bag of groceries to waist level 1   4. Lifting a bag of groceries above your head 0  5. Grooming your hair 0  6. Pushing up on your hands (I.e. from bathtub or chair) 1  7. Preparing food (I.e. peeling/cutting) 0  8.  Driving  2  9. Vacuuming, sweeping, or raking 0  10. Dressing  1  11. Doing up buttons 2  12. Using tools/appliances 1  13. Opening doors 1  14. Cleaning  0  15. Tying or lacing shoes 0  16. Sleeping  1  17. Laundering clothes (I.e. washing, ironing, folding) 0  18. Opening a jar 0  19. Throwing a ball 0  20. Carrying a small suitcase with your affected limb.  0  Score total:  12/80     UPPER EXTREMITY ROM:       Assessed in seated, er/IR adducted  Passive ROM Left eval  Shoulder flexion 91  Shoulder abduction 95  Shoulder internal rotation 90  Shoulder external rotation 27  (Blank rows = not tested)    UPPER EXTREMITY MMT:     Assessed in seated, er/IR adducted  MMT Left eval  Shoulder flexion   Shoulder abduction   Shoulder internal rotation   Shoulder external rotation   (Blank rows = not tested)  HAND FUNCTION: Grip strength: Left: 14 lbs  SENSATION: WFL  EDEMA: No swelling noted  OBSERVATIONS: Pt has moderate to severe fascial restictions along her bicep, scapular region, and trapezius.    TODAY'S TREATMENT:                                                                                                                              DATE:   05/09/24 -Manual therapy: myofascial release and trigger point applied to biceps, scapular region, and trapezius in order to reduce fascial restrictions and pain, as well as improve ROM -P/ROM: supine - flexion, abduction, horizontal abduction, er/IR, x10 -Wall Washes: 2x60 -Thumbtacs: 2x60 -Pulleys: flexion, abduction, x10 -Ball Rolls: flexion, abduction, x10  05/05/24 -Manual therapy: myofascial release and trigger point applied to biceps, scapular region, and trapezius in order to reduce fascial restrictions and pain, as well as improve ROM -P/ROM: supine - flexion, abduction, horizontal abduction, er/IR, x10 -Wall Washes: 2x60 -Thumbtacs: 2x60 -Pendulums: 2x30  04/17/24: -Wrist ROM: flexion,  extension, ulnar/radial deviation, supination/pronation, x10 -Table Slides: flexion, abduction, er, x10 -Pendulums: 2x30    PATIENT EDUCATION: Education details: Continue HEP Person educated: Patient Education method: Programmer, Multimedia, Demonstration, and Handouts Education comprehension: verbalized understanding and returned demonstration  HOME EXERCISE PROGRAM: 10/27: Wrist ROM, table slides, pendulums  GOALS: Goals reviewed with patient? Yes   SHORT TERM  GOALS: Target date: 05/18/24  Pt will be provided with and educated on HEP to improve mobility in LUE required for use during ADL completion.   Goal status: IN PROGRESS  2.  Pt will increase LUE P/ROM by 40 degrees to improve ability to use LUE during dressing tasks with minimal compensatory techniques.   Goal status: IN PROGRESS  3.  Pt will increase LUE strength to 3+/5 to improve ability to reach for items at waist to chest height during bathing and grooming tasks.   Goal status: IN PROGRESS  4. Pt will increase LUE grip strength by 20lbs in order to grasp and hold items needed for cooking and cleaning.    Goal status: IN PROGRESS   LONG TERM GOALS: Target date: 06/17/24  Pt will decrease pain in LUE to 3/10 or less to improve ability to sleep for 2+ consecutive hours without waking due to pain.   Goal status: IN PROGRESS  2.  Pt will decrease LUE fascial restrictions to min amounts or less to improve mobility required for functional reaching tasks.   Goal status: IN PROGRESS  3.  Pt will increase LUE A/ROM by 50 degrees to improve ability to use LUE when reaching overhead or behind back during dressing and bathing tasks.   Goal status: IN PROGRESS  4.  Pt will increase LUE strength to 4+/5 or greater to improve ability to use LUE when lifting or carrying items during meal preparation/housework/yardwork tasks.   Goal status: IN PROGRESS  5.  Pt will return to highest level of function using LUE as  dominant/non-dominant during functional task completion.   Goal status: IN PROGRESS   ASSESSMENT:  CLINICAL IMPRESSION: Pt had increased soreness and sensitivity in the shoulder girdle. She required light touch throughout manual therapy to reduce fascial restrictions and address pain. Her ROM was stiff this session achieving approximately 65% of full ROM. OT added pulleys to assist with further stretching her ROM with minimal resistance. Verbal and tactile cuing provided for positioning and technique throughout session.   PERFORMANCE DEFICITS: in functional skills including in functional skills including ADLs, IADLs, coordination, tone, ROM, strength, pain, fascial restrictions, muscle spasms, and UE functional use.   PLAN:  OT FREQUENCY: 2x/week  OT DURATION: 8 weeks  PLANNED INTERVENTIONS: 97168 OT Re-evaluation, 97535 self care/ADL training, 02889 therapeutic exercise, 97530 therapeutic activity, 97112 neuromuscular re-education, 97140 manual therapy, 97035 ultrasound, 97010 moist heat, 97032 electrical stimulation (manual), passive range of motion, functional mobility training, energy conservation, and coping strategies training  RECOMMENDED OTHER SERVICES: N/A  CONSULTED AND AGREED WITH PLAN OF CARE: Patient  PLAN FOR NEXT SESSION: Manual Therapy, P/ROM, provide theraputty   Valentin Nightingale, OTR/L [facility] [phone] [place], OT 05/09/2024, 9:09 AM    Managed Medicaid Authorization Request Treatment Start Date: 10/27/225  Visit Dx Codes: M25.511, M25.611, R29.898  Functional Tool Score: UEFS: June 01, 1979  For all possible CPT codes, reference the Planned Interventions line above.     Check all conditions that are expected to impact treatment: {Conditions expected to impact treatment:None of these apply   If treatment provided at initial evaluation, no treatment charged due to lack of authorization.

## 2024-05-12 ENCOUNTER — Ambulatory Visit (HOSPITAL_COMMUNITY): Admitting: Occupational Therapy

## 2024-05-12 ENCOUNTER — Encounter (HOSPITAL_COMMUNITY): Payer: Self-pay | Admitting: Occupational Therapy

## 2024-05-12 DIAGNOSIS — M25511 Pain in right shoulder: Secondary | ICD-10-CM | POA: Diagnosis not present

## 2024-05-12 DIAGNOSIS — M25611 Stiffness of right shoulder, not elsewhere classified: Secondary | ICD-10-CM

## 2024-05-12 DIAGNOSIS — R29898 Other symptoms and signs involving the musculoskeletal system: Secondary | ICD-10-CM | POA: Diagnosis not present

## 2024-05-12 NOTE — Therapy (Signed)
 OUTPATIENT OCCUPATIONAL THERAPY ORTHO TREATMENT NOTE  Patient Name: Carrie Mitchell MRN: [id number] DOB:Jan 18, 1971, 53 y.o., female Today's Date: 05/12/2024   END OF SESSION:  OT End of Session - 05/12/24 0901     Visit Number 4    Number of Visits 16    Date for Recertification  06/17/24    Authorization Type BCBS    Authorization Time Period 10 visits (04/17/24-07/16/23)    Authorization - Visit Number 4    Authorization - Number of Visits 10    OT Start Time [phone]    OT Stop Time 0901    OT Time Calculation (min) 45 min    Activity Tolerance Patient tolerated treatment well    Behavior During Therapy [facility] for tasks assessed/performed          Past Medical History:  Diagnosis Date   Cellulitis    Cholelithiasis 03/18/2013   Dermatitis 02/16/2012   Diabetes mellitus without complication (HCC)    High cholesterol    Hypertension    Obesity    Prediabetes    Vitamin D  deficiency    Past Surgical History:  Procedure Laterality Date   ARTHOSCOPIC ROTAOR CUFF REPAIR Left 03/06/2024   Procedure: REPAIR, ROTATOR CUFF, ARTHROSCOPIC;  Surgeon: Onesimo Oneil LABOR, MD;  Location: [facility];  Service: Orthopedics;  Laterality: Left;   BREAST SURGERY Right    cyst removal benign   CHOLECYSTECTOMY N/A 03/20/2013   Procedure: LAPAROSCOPIC CHOLECYSTECTOMY;  Surgeon: Thresa JAYSON Pulling, MD;  Location: [facility];  Service: General;  Laterality: N/A;   TUBAL LIGATION     Patient Active Problem List   Diagnosis Date Noted   Pain in left arm 12/14/2023   Hypertension 05/13/2023   Dysuria 04/17/2023   Chronic right shoulder pain 04/17/2023   Elevated BP without diagnosis of hypertension 05/30/2022   Cellulitis 01/30/2014   Urinary incontinence, urge 07/20/2013   Acute renal insufficiency 03/18/2013   Cholelithiasis 03/18/2013   Abdominal pain, right upper quadrant 03/18/2013   Cellulitis of leg, right 03/18/2013   Chest pain 03/17/2013   Nausea & vomiting 03/17/2013   Dermatitis 02/16/2012    Hypokalemia 02/14/2012   Obesity 11/09/2011   Degenerative joint disease of right knee 04/25/2009    PCP: Shona Norleen PEDLAR, MD REFERRING PROVIDER: Onesimo Oneil, MD  ONSET DATE: 03/06/24  REFERRING DIAG:  D53.987I (ICD-10-CM) - Traumatic complete tear of left rotator cuff, subsequent encounter    THERAPY DIAG:  Acute pain of right shoulder  Shoulder stiffness, right  Other symptoms and signs involving the musculoskeletal system  Rationale for Evaluation and Treatment: Rehabilitation  SUBJECTIVE:   SUBJECTIVE STATEMENT: I'm feeling pretty good Pt accompanied by: self  PERTINENT HISTORY: Pt had a fall in June 2025, where she tore her L RC. She is now s/p Massive RCR. 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  PRECAUTIONS: Shoulder  WEIGHT BEARING RESTRICTIONS: Yes 1-2#  PAIN:  Are you having pain? Yes: NPRS scale: 4/10 Pain location: Anterior shoulder girdle Pain description: aching Aggravating factors: moving Relieving factors: medication  FALLS: Has patient fallen in last 6 months? Yes. Number of falls 1  PLOF: Independent  PATIENT GOALS: To get all independence back  NEXT MD VISIT: 05/23/24  OBJECTIVE:   HAND DOMINANCE: Left  ADLs: Overall ADLs: Pt requiring max assist with all dressing and bathing. Unable to cook or clean at this time.   FUNCTIONAL OUTCOME MEASURES: Upper Extremity Functional Scale (UEFS): 12/80   Extreme difficulty/unable (0),  Quite a bit of difficulty (1), Moderate difficulty (2), Little difficulty (3), No difficulty (4) Survey date:  04/17/24  Any of your usual work, household or school activities 1  2. Your usual hobbies, recreational/sport activities 1   3. Lifting a bag of groceries to waist level 1   4. Lifting a bag of groceries above your head 0  5. Grooming your hair 0  6. Pushing up on your hands (I.e. from bathtub or chair) 1  7. Preparing food (I.e. peeling/cutting) 0  8. Driving  2  9.  Vacuuming, sweeping, or raking 0  10. Dressing  1  11. Doing up buttons 2  12. Using tools/appliances 1  13. Opening doors 1  14. Cleaning  0  15. Tying or lacing shoes 0  16. Sleeping  1  17. Laundering clothes (I.e. washing, ironing, folding) 0  18. Opening a jar 0  19. Throwing a ball 0  20. Carrying a small suitcase with your affected limb.  0  Score total:  12/80     UPPER EXTREMITY ROM:       Assessed in seated, er/IR adducted  Passive ROM Left eval  Shoulder flexion 91  Shoulder abduction 95  Shoulder internal rotation 90  Shoulder external rotation 27  (Blank rows = not tested)    UPPER EXTREMITY MMT:     Assessed in seated, er/IR adducted  MMT Left eval  Shoulder flexion   Shoulder abduction   Shoulder internal rotation   Shoulder external rotation   (Blank rows = not tested)  HAND FUNCTION: Grip strength: Left: 14 lbs  SENSATION: WFL  EDEMA: No swelling noted  OBSERVATIONS: Pt has moderate to severe fascial restictions along her bicep, scapular region, and trapezius.    TODAY'S TREATMENT:                                                                                                                              DATE:   05/12/24 -Manual therapy: myofascial release and trigger point applied to biceps, scapular region, and trapezius in order to reduce fascial restrictions and pain, as well as improve ROM -P/ROM: supine - flexion, abduction, horizontal abduction, er/IR, x10 -AA/ROM: supine - OT assisted as pt is unable to hold dowel - Flexion, abduction, protraction, horizontal abduction, er/IR, x10 -Wall Climbs: flexion, x10 -Pulleys: flexion, abduction, x10  05/09/24 -Manual therapy: myofascial release and trigger point applied to biceps, scapular region, and trapezius in order to reduce fascial restrictions and pain, as well as improve ROM -P/ROM: supine - flexion, abduction, horizontal abduction, er/IR, x10 -Wall Washes: 2x60 -Thumbtacs:  2x60 -Pulleys: flexion, abduction, x10 -Ball Rolls: flexion, abduction, x10  05/05/24 -Manual therapy: myofascial release and trigger point applied to biceps, scapular region, and trapezius in order to reduce fascial restrictions and pain, as well as improve ROM -P/ROM: supine - flexion, abduction, horizontal abduction, er/IR, x10 -Wall Washes: 2x60 -Thumbtacs: 2x60 -Pendulums: 2x30   PATIENT EDUCATION: Education  details: Continue HEP Person educated: Patient Education method: Explanation, Demonstration, and Handouts Education comprehension: verbalized understanding and returned demonstration  HOME EXERCISE PROGRAM: 05/13/2024: Wrist ROM, table slides, pendulums  GOALS: Goals reviewed with patient? Yes   SHORT TERM GOALS: Target date: 05/18/24  Pt will be provided with and educated on HEP to improve mobility in LUE required for use during ADL completion.   Goal status: IN PROGRESS  2.  Pt will increase LUE P/ROM by 40 degrees to improve ability to use LUE during dressing tasks with minimal compensatory techniques.   Goal status: IN PROGRESS  3.  Pt will increase LUE strength to 3+/5 to improve ability to reach for items at waist to chest height during bathing and grooming tasks.   Goal status: IN PROGRESS  4. Pt will increase LUE grip strength by 20lbs in order to grasp and hold items needed for cooking and cleaning.    Goal status: IN PROGRESS   LONG TERM GOALS: Target date: 06/17/24  Pt will decrease pain in LUE to 3/10 or less to improve ability to sleep for 2+ consecutive hours without waking due to pain.   Goal status: IN PROGRESS  2.  Pt will decrease LUE fascial restrictions to min amounts or less to improve mobility required for functional reaching tasks.   Goal status: IN PROGRESS  3.  Pt will increase LUE A/ROM by 50 degrees to improve ability to use LUE when reaching overhead or behind back during dressing and bathing tasks.   Goal status: IN  PROGRESS  4.  Pt will increase LUE strength to 4+/5 or greater to improve ability to use LUE when lifting or carrying items during meal preparation/housework/yardwork tasks.   Goal status: IN PROGRESS  5.  Pt will return to highest level of function using LUE as dominant/non-dominant during functional task completion.   Goal status: IN PROGRESS   ASSESSMENT:  CLINICAL IMPRESSION: This session pt having no pain and minimal fascial restrictions. Her P/ROM is full and only having mild pain and tightness in her end range. Pt and OT then worked on AA/ROM where OT provided 50% of assist and pt moved her arm with 50% effort. Verbal and tactile cuing provided for positioning and technique throughout session.   PERFORMANCE DEFICITS: in functional skills including in functional skills including ADLs, IADLs, coordination, tone, ROM, strength, pain, fascial restrictions, muscle spasms, and UE functional use.   PLAN:  OT FREQUENCY: 2x/week  OT DURATION: 8 weeks  PLANNED INTERVENTIONS: 97168 OT Re-evaluation, 97535 self care/ADL training, 02889 therapeutic exercise, 97530 therapeutic activity, 97112 neuromuscular re-education, 97140 manual therapy, 97035 ultrasound, 97010 moist heat, 97032 electrical stimulation (manual), passive range of motion, functional mobility training, energy conservation, and coping strategies training  RECOMMENDED OTHER SERVICES: N/A  CONSULTED AND AGREED WITH PLAN OF CARE: Patient  PLAN FOR NEXT SESSION: Manual Therapy, P/ROM, provide theraputty   Valentin Nightingale, OTR/L [facility] [phone] [place], OT 05/12/2024, 9:03 AM    Managed Medicaid Authorization Request Treatment Start Date: 10/27/225  Visit Dx Codes: M25.511, M25.611, R29.898  Functional Tool Score: UEFS: 06-18-1979  For all possible CPT codes, reference the Planned Interventions line above.     Check all conditions that are expected to impact treatment: {Conditions  expected to impact treatment:None of these apply   If treatment provided at initial evaluation, no treatment charged due to lack of authorization.

## 2024-05-23 ENCOUNTER — Ambulatory Visit: Admitting: Orthopedic Surgery

## 2024-05-23 ENCOUNTER — Encounter: Payer: Self-pay | Admitting: Orthopedic Surgery

## 2024-05-23 VITALS — Ht 69.0 in | Wt 353.2 lb

## 2024-05-23 DIAGNOSIS — S46012D Strain of muscle(s) and tendon(s) of the rotator cuff of left shoulder, subsequent encounter: Secondary | ICD-10-CM

## 2024-05-23 NOTE — Progress Notes (Signed)
 Orthopaedic Postop Note  Assessment: Carrie Mitchell is a 53 y.o. female s/p left shoulder rotator cuff repair for massive tear  DOS: 03/06/24  Plan: [name] has done well.  She has minimal pain.  She is working with physical therapy.  She reports that her motion is much improved while working with therapy.  However, she continues to have some stiffness.  On exam, she tolerates forward flexion to 90 degrees.  She has limited external rotation.  She may be developing some additional stiffness in the left shoulder.  Given the size of her tear, this is almost to be expected.  Recommend she continue to work with therapy.  If she continues to struggle at the next visit, I would recommend a high-volume steroid injection.  She states understanding.  Follow-up in 1 month.  Follow-up: Return in about 4 weeks (around 06/20/2024). XR at next visit: None  Subjective:  Chief Complaint  Patient presents with   Shoulder Pain    Left shoulder DOS 03/06/24//most of the time it feels ok. I think I am doing good.    History of Present Illness: Carrie Mitchell is a 52 y.o. female who presents following the above stated procedure.  Surgery was approximately 3 months ago.  Overall, she is doing well.  She has minimal pain.  Occasional ibuprofen .  She is working with therapy.  She notes improvement in her symptoms, and range of motion while working with therapy.  However, she does continue to have some limitations in motion.  Review of Systems: No fevers or chills No numbness or tingling No Chest Pain No shortness of breath Itching   Objective: Ht 5' 9 (1.753 m)   Wt (!) 353 lb 4 oz (160.2 kg)   LMP 06/29/2013   BMI 52.17 kg/m   Physical Exam:  Alert and oriented.  No acute distress  Surgical incisions have healed.  No surrounding erythema or drainage.  Continue to have some anterior based sensitivity around the portal incision.  80 degrees of forward flexion.  75 degrees of abduction.   External rotation of 25 degrees at her side.  IMAGING: I personally ordered and reviewed the following images:  No new imaging obtained today.  Oneil DELENA Horde, MD 05/23/2024 1:57 PM

## 2024-05-24 ENCOUNTER — Encounter (HOSPITAL_COMMUNITY): Payer: Self-pay | Admitting: Occupational Therapy

## 2024-05-24 ENCOUNTER — Ambulatory Visit (HOSPITAL_COMMUNITY): Attending: Orthopedic Surgery | Admitting: Occupational Therapy

## 2024-05-24 DIAGNOSIS — M25511 Pain in right shoulder: Secondary | ICD-10-CM | POA: Diagnosis present

## 2024-05-24 DIAGNOSIS — R29898 Other symptoms and signs involving the musculoskeletal system: Secondary | ICD-10-CM | POA: Diagnosis present

## 2024-05-24 DIAGNOSIS — M25611 Stiffness of right shoulder, not elsewhere classified: Secondary | ICD-10-CM | POA: Insufficient documentation

## 2024-05-24 NOTE — Therapy (Signed)
 OUTPATIENT OCCUPATIONAL THERAPY ORTHO TREATMENT NOTE  Patient Name: Carrie  Mitchell MRN: [id number] DOB:1971/05/08, 53 y.o., female Today's Date: 05/24/2024   END OF SESSION:  OT End of Session - 05/24/24 1116     Visit Number 5    Number of Visits 16    Date for Recertification  06/17/24    Authorization Type BCBS    Authorization Time Period 10 visits (04/17/24-07/16/23)    Authorization - Visit Number 5    Authorization - Number of Visits 10    OT Start Time 0820    OT Stop Time 0902    OT Time Calculation (min) 42 min    Activity Tolerance Patient tolerated treatment well    Behavior During Therapy [facility] for tasks assessed/performed          Past Medical History:  Diagnosis Date   Cellulitis    Cholelithiasis 03/18/2013   Dermatitis 02/16/2012   Diabetes mellitus without complication (HCC)    High cholesterol    Hypertension    Obesity    Prediabetes    Vitamin D  deficiency    Past Surgical History:  Procedure Laterality Date   ARTHOSCOPIC ROTAOR CUFF REPAIR Left 03/06/2024   Procedure: REPAIR, ROTATOR CUFF, ARTHROSCOPIC;  Surgeon: Onesimo Oneil LABOR, MD;  Location: [facility];  Service: Orthopedics;  Laterality: Left;   BREAST SURGERY Right    cyst removal benign   CHOLECYSTECTOMY N/A 03/20/2013   Procedure: LAPAROSCOPIC CHOLECYSTECTOMY;  Surgeon: Thresa JAYSON Pulling, MD;  Location: [facility];  Service: General;  Laterality: N/A;   TUBAL LIGATION     Patient Active Problem List   Diagnosis Date Noted   Pain in left arm 12/14/2023   Hypertension 05/13/2023   Dysuria 04/17/2023   Chronic right shoulder pain 04/17/2023   Elevated BP without diagnosis of hypertension 05/30/2022   Cellulitis 01/30/2014   Urinary incontinence, urge 07/20/2013   Acute renal insufficiency 03/18/2013   Cholelithiasis 03/18/2013   Abdominal pain, right upper quadrant 03/18/2013   Cellulitis of leg, right 03/18/2013   Chest pain 03/17/2013   Nausea & vomiting 03/17/2013   Dermatitis 02/16/2012    Hypokalemia 02/14/2012   Obesity 11/09/2011   Degenerative joint disease of right knee 04/25/2009    PCP: Shona Norleen PEDLAR, MD REFERRING PROVIDER: Onesimo Oneil, MD  ONSET DATE: 03/06/24  REFERRING DIAG:  D53.987I (ICD-10-CM) - Traumatic complete tear of left rotator cuff, subsequent encounter    THERAPY DIAG:  Acute pain of right shoulder  Shoulder stiffness, right  Other symptoms and signs involving the musculoskeletal system  Rationale for Evaluation and Treatment: Rehabilitation  SUBJECTIVE:   SUBJECTIVE STATEMENT: The doctor said that I'm too stiff Pt accompanied by: self  PERTINENT HISTORY: Pt had a fall in June 2025, where she tore her L RC. She is now s/p Massive RCR. 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  PRECAUTIONS: Shoulder  WEIGHT BEARING RESTRICTIONS: Yes 1-2#  PAIN:  Are you having pain? Yes: NPRS scale: 1/10 Pain location: Anterior shoulder girdle Pain description: aching Aggravating factors: moving Relieving factors: medication  FALLS: Has patient fallen in last 6 months? Yes. Number of falls 1  PLOF: Independent  PATIENT GOALS: To get all independence back  NEXT MD VISIT: 05/23/24  OBJECTIVE:   HAND DOMINANCE: Left  ADLs: Overall ADLs: Pt requiring max assist with all dressing and bathing. Unable to cook or clean at this time.   FUNCTIONAL OUTCOME MEASURES: Upper Extremity Functional Scale (UEFS): 12/80  Extreme difficulty/unable (0), Quite a bit of difficulty (1), Moderate difficulty (2), Little difficulty (3), No difficulty (4) Survey date:  04/17/24  Any of your usual work, household or school activities 1  2. Your usual hobbies, recreational/sport activities 1   3. Lifting a bag of groceries to waist level 1   4. Lifting a bag of groceries above your head 0  5. Grooming your hair 0  6. Pushing up on your hands (I.e. from bathtub or chair) 1  7. Preparing food (I.e. peeling/cutting) 0  8. Driving  2   9. Vacuuming, sweeping, or raking 0  10. Dressing  1  11. Doing up buttons 2  12. Using tools/appliances 1  13. Opening doors 1  14. Cleaning  0  15. Tying or lacing shoes 0  16. Sleeping  1  17. Laundering clothes (I.e. washing, ironing, folding) 0  18. Opening a jar 0  19. Throwing a ball 0  20. Carrying a small suitcase with your affected limb.  0  Score total:  12/80     UPPER EXTREMITY ROM:       Assessed in seated, er/IR adducted  Passive ROM Left eval  Shoulder flexion 91  Shoulder abduction 95  Shoulder internal rotation 90  Shoulder external rotation 27  (Blank rows = not tested)    UPPER EXTREMITY MMT:     Assessed in seated, er/IR adducted  MMT Left eval  Shoulder flexion   Shoulder abduction   Shoulder internal rotation   Shoulder external rotation   (Blank rows = not tested)  HAND FUNCTION: Grip strength: Left: 14 lbs  SENSATION: WFL  EDEMA: No swelling noted  OBSERVATIONS: Pt has moderate to severe fascial restictions along her bicep, scapular region, and trapezius.    TODAY'S TREATMENT:                                                                                                                              DATE:   05/24/24 -Manual therapy: myofascial release and trigger point applied to biceps, scapular region, and trapezius in order to reduce fascial restrictions and pain, as well as improve ROM -P/ROM: supine - flexion, abduction, horizontal abduction, er/IR, x10 -AA/ROM: supine - OT assisted as pt is unable to hold dowel - Flexion, abduction, protraction, horizontal abduction, er/IR, x10 -Pulleys: flexion, abduction, x10 -Theraball Exercise: protraction, flexion, x10  05/12/24 -Manual therapy: myofascial release and trigger point applied to biceps, scapular region, and trapezius in order to reduce fascial restrictions and pain, as well as improve ROM -P/ROM: supine - flexion, abduction, horizontal abduction, er/IR, x10 -AA/ROM:  supine - OT assisted as pt is unable to hold dowel - Flexion, abduction, protraction, horizontal abduction, er/IR, x10 -Wall Climbs: flexion, x10 -Pulleys: flexion, abduction, x10  05/09/24 -Manual therapy: myofascial release and trigger point applied to biceps, scapular region, and trapezius in order to reduce fascial restrictions and pain, as well as improve ROM -P/ROM: supine -  flexion, abduction, horizontal abduction, er/IR, x10 -Wall Washes: 2x60 -Thumbtacs: 2x60 -Pulleys: flexion, abduction, x10 -Ball Rolls: flexion, abduction, x10  PATIENT EDUCATION: Education details: Continue HEP Person educated: Patient Education method: Programmer, Multimedia, Demonstration, and Handouts Education comprehension: verbalized understanding and returned demonstration  HOME EXERCISE PROGRAM: Apr 27, 2024: Wrist ROM, table slides, pendulums  GOALS: Goals reviewed with patient? Yes   SHORT TERM GOALS: Target date: 05/18/24  Pt will be provided with and educated on HEP to improve mobility in LUE required for use during ADL completion.   Goal status: IN PROGRESS  2.  Pt will increase LUE P/ROM by 40 degrees to improve ability to use LUE during dressing tasks with minimal compensatory techniques.   Goal status: IN PROGRESS  3.  Pt will increase LUE strength to 3+/5 to improve ability to reach for items at waist to chest height during bathing and grooming tasks.   Goal status: IN PROGRESS  4. Pt will increase LUE grip strength by 20lbs in order to grasp and hold items needed for cooking and cleaning.    Goal status: IN PROGRESS   LONG TERM GOALS: Target date: 06/17/24  Pt will decrease pain in LUE to 3/10 or less to improve ability to sleep for 2+ consecutive hours without waking due to pain.   Goal status: IN PROGRESS  2.  Pt will decrease LUE fascial restrictions to min amounts or less to improve mobility required for functional reaching tasks.   Goal status: IN PROGRESS  3.  Pt will increase  LUE A/ROM by 50 degrees to improve ability to use LUE when reaching overhead or behind back during dressing and bathing tasks.   Goal status: IN PROGRESS  4.  Pt will increase LUE strength to 4+/5 or greater to improve ability to use LUE when lifting or carrying items during meal preparation/housework/yardwork tasks.   Goal status: IN PROGRESS  5.  Pt will return to highest level of function using LUE as dominant/non-dominant during functional task completion.   Goal status: IN PROGRESS   ASSESSMENT:  CLINICAL IMPRESSION: Passively and assisted, pt is able to achieve near full ROM in supine. However once in sitting, pt is unable to reach even to shoulder height unless utilizing the pulleys. Pt reports no pain, just limited mobility and weakness. OT attempting to assist pt with mobility using a theraball, as well as providing hands on assist. Verbal and tactile cuing provided for positioning and technique.    PERFORMANCE DEFICITS: in functional skills including in functional skills including ADLs, IADLs, coordination, tone, ROM, strength, pain, fascial restrictions, muscle spasms, and UE functional use.   PLAN:  OT FREQUENCY: 2x/week  OT DURATION: 8 weeks  PLANNED INTERVENTIONS: 97168 OT Re-evaluation, 97535 self care/ADL training, 02889 therapeutic exercise, 97530 therapeutic activity, 97112 neuromuscular re-education, 97140 manual therapy, 97035 ultrasound, 97010 moist heat, 97032 electrical stimulation (manual), passive range of motion, functional mobility training, energy conservation, and coping strategies training  RECOMMENDED OTHER SERVICES: N/A  CONSULTED AND AGREED WITH PLAN OF CARE: Patient  PLAN FOR NEXT SESSION: Manual Therapy, P/ROM, provide theraputty   Valentin Nightingale, OTR/L [facility] [phone] Janelle Spellman Jillyn Nightingale, OT 05/24/2024, 11:17 AM    Managed Medicaid Authorization Request Treatment Start Date: 10/27/225  Visit Dx Codes: M25.511,  M25.611, R29.898  Functional Tool Score: UEFS: 07-[birth year]-1979  For all possible CPT codes, reference the Planned Interventions line above.     Check all conditions that are expected to impact treatment: {Conditions expected to impact treatment:None of these  apply   If treatment provided at initial evaluation, no treatment charged due to lack of authorization.

## 2024-05-26 ENCOUNTER — Encounter (HOSPITAL_COMMUNITY): Payer: Self-pay | Admitting: Occupational Therapy

## 2024-05-26 ENCOUNTER — Ambulatory Visit (HOSPITAL_COMMUNITY): Admitting: Occupational Therapy

## 2024-05-26 DIAGNOSIS — R29898 Other symptoms and signs involving the musculoskeletal system: Secondary | ICD-10-CM

## 2024-05-26 DIAGNOSIS — M25511 Pain in right shoulder: Secondary | ICD-10-CM | POA: Diagnosis not present

## 2024-05-26 DIAGNOSIS — M25611 Stiffness of right shoulder, not elsewhere classified: Secondary | ICD-10-CM

## 2024-05-26 NOTE — Patient Instructions (Signed)

## 2024-05-26 NOTE — Therapy (Signed)
 OUTPATIENT OCCUPATIONAL THERAPY ORTHO TREATMENT NOTE  Patient Name: Carrie  Mitchell MRN: [id number] DOB:06/07/1971, 53 y.o., female Today's Date: 05/26/2024   END OF SESSION:  OT End of Session - 05/26/24 1006     Visit Number 6    Number of Visits 16    Date for Recertification  06/17/24    Authorization Type BCBS    Authorization Time Period 10 visits (04/17/24-07/16/23)    Authorization - Visit Number 6    Authorization - Number of Visits 10    OT Start Time 0902    OT Stop Time 0945    OT Time Calculation (min) 43 min    Activity Tolerance Patient tolerated treatment well    Behavior During Therapy [facility] for tasks assessed/performed          Past Medical History:  Diagnosis Date   Cellulitis    Cholelithiasis 03/18/2013   Dermatitis 02/16/2012   Diabetes mellitus without complication (HCC)    High cholesterol    Hypertension    Obesity    Prediabetes    Vitamin D  deficiency    Past Surgical History:  Procedure Laterality Date   ARTHOSCOPIC ROTAOR CUFF REPAIR Left 03/06/2024   Procedure: REPAIR, ROTATOR CUFF, ARTHROSCOPIC;  Surgeon: Onesimo Oneil LABOR, MD;  Location: [facility];  Service: Orthopedics;  Laterality: Left;   BREAST SURGERY Right    cyst removal benign   CHOLECYSTECTOMY N/A 03/20/2013   Procedure: LAPAROSCOPIC CHOLECYSTECTOMY;  Surgeon: Thresa JAYSON Pulling, MD;  Location: [facility];  Service: General;  Laterality: N/A;   TUBAL LIGATION     Patient Active Problem List   Diagnosis Date Noted   Pain in left arm 12/14/2023   Hypertension 05/13/2023   Dysuria 04/17/2023   Chronic right shoulder pain 04/17/2023   Elevated BP without diagnosis of hypertension 05/30/2022   Cellulitis 01/30/2014   Urinary incontinence, urge 07/20/2013   Acute renal insufficiency 03/18/2013   Cholelithiasis 03/18/2013   Abdominal pain, right upper quadrant 03/18/2013   Cellulitis of leg, right 03/18/2013   Chest pain 03/17/2013   Nausea & vomiting 03/17/2013   Dermatitis 02/16/2012    Hypokalemia 02/14/2012   Obesity 11/09/2011   Degenerative joint disease of right knee 04/25/2009    PCP: Shona Norleen PEDLAR, MD REFERRING PROVIDER: Onesimo Oneil, MD  ONSET DATE: 03/06/24  REFERRING DIAG:  D53.987I (ICD-10-CM) - Traumatic complete tear of left rotator cuff, subsequent encounter    THERAPY DIAG:  Acute pain of right shoulder  Shoulder stiffness, right  Other symptoms and signs involving the musculoskeletal system  Rationale for Evaluation and Treatment: Rehabilitation  SUBJECTIVE:   SUBJECTIVE STATEMENT: The doctor said that I'm too stiff Pt accompanied by: self  PERTINENT HISTORY: Pt had a fall in June 2025, where she tore her L RC. She is now s/p Massive RCR. 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  PRECAUTIONS: Shoulder  WEIGHT BEARING RESTRICTIONS: Yes 1-2#  PAIN:  Are you having pain? Yes: NPRS scale: 1/10 Pain location: Anterior shoulder girdle Pain description: aching Aggravating factors: moving Relieving factors: medication  FALLS: Has patient fallen in last 6 months? Yes. Number of falls 1  PLOF: Independent  PATIENT GOALS: To get all independence back  NEXT MD VISIT: 05/23/24  OBJECTIVE:   HAND DOMINANCE: Left  ADLs: Overall ADLs: Pt requiring max assist with all dressing and bathing. Unable to cook or clean at this time.   FUNCTIONAL OUTCOME MEASURES: Upper Extremity Functional Scale (UEFS): 12/80  Extreme difficulty/unable (0), Quite a bit of difficulty (1), Moderate difficulty (2), Little difficulty (3), No difficulty (4) Survey date:  04/17/24  Any of your usual work, household or school activities 1  2. Your usual hobbies, recreational/sport activities 1   3. Lifting a bag of groceries to waist level 1   4. Lifting a bag of groceries above your head 0  5. Grooming your hair 0  6. Pushing up on your hands (I.e. from bathtub or chair) 1  7. Preparing food (I.e. peeling/cutting) 0  8. Driving  2   9. Vacuuming, sweeping, or raking 0  10. Dressing  1  11. Doing up buttons 2  12. Using tools/appliances 1  13. Opening doors 1  14. Cleaning  0  15. Tying or lacing shoes 0  16. Sleeping  1  17. Laundering clothes (I.e. washing, ironing, folding) 0  18. Opening a jar 0  19. Throwing a ball 0  20. Carrying a small suitcase with your affected limb.  0  Score total:  12/80     UPPER EXTREMITY ROM:       Assessed in seated, er/IR adducted  Passive ROM Left eval  Shoulder flexion 91  Shoulder abduction 95  Shoulder internal rotation 90  Shoulder external rotation 27  (Blank rows = not tested)    UPPER EXTREMITY MMT:     Assessed in seated, er/IR adducted  MMT Left eval  Shoulder flexion   Shoulder abduction   Shoulder internal rotation   Shoulder external rotation   (Blank rows = not tested)  HAND FUNCTION: Grip strength: Left: 14 lbs  SENSATION: WFL  EDEMA: No swelling noted  OBSERVATIONS: Pt has moderate to severe fascial restictions along her bicep, scapular region, and trapezius.    TODAY'S TREATMENT:                                                                                                                              DATE:   05/26/24 -Manual therapy: myofascial release and trigger point applied to biceps, scapular region, and trapezius in order to reduce fascial restrictions and pain, as well as improve ROM -P/ROM: supine - flexion, abduction, horizontal abduction, er/IR, x10 -A/ROM: supine - flexion, abduction, horizontal abduction, er/IR, x10 -Pulleys: flexion, abduction, x10  05/24/24 -Manual therapy: myofascial release and trigger point applied to biceps, scapular region, and trapezius in order to reduce fascial restrictions and pain, as well as improve ROM -P/ROM: supine - flexion, abduction, horizontal abduction, er/IR, x10 -AA/ROM: supine - OT assisted as pt is unable to hold dowel - Flexion, abduction, protraction, horizontal abduction,  er/IR, x10 -Pulleys: flexion, abduction, x10 -Theraball Exercise: protraction, flexion, x10  05/12/24 -Manual therapy: myofascial release and trigger point applied to biceps, scapular region, and trapezius in order to reduce fascial restrictions and pain, as well as improve ROM -P/ROM: supine - flexion, abduction, horizontal abduction, er/IR, x10 -AA/ROM: supine - OT assisted as pt is unable  to hold dowel - Flexion, abduction, protraction, horizontal abduction, er/IR, x10 -Wall Climbs: flexion, x10 -Pulleys: flexion, abduction, x10  PATIENT EDUCATION: Education details: Continue HEP Person educated: Patient Education method: Programmer, Multimedia, Demonstration, and Handouts Education comprehension: verbalized understanding and returned demonstration  HOME EXERCISE PROGRAM: 05-08-2024: Wrist ROM, table slides, pendulums 12/5: A/ROM  GOALS: Goals reviewed with patient? Yes   SHORT TERM GOALS: Target date: 05/18/24  Pt will be provided with and educated on HEP to improve mobility in LUE required for use during ADL completion.   Goal status: IN PROGRESS  2.  Pt will increase LUE P/ROM by 40 degrees to improve ability to use LUE during dressing tasks with minimal compensatory techniques.   Goal status: IN PROGRESS  3.  Pt will increase LUE strength to 3+/5 to improve ability to reach for items at waist to chest height during bathing and grooming tasks.   Goal status: IN PROGRESS  4. Pt will increase LUE grip strength by 20lbs in order to grasp and hold items needed for cooking and cleaning.    Goal status: IN PROGRESS   LONG TERM GOALS: Target date: 06/17/24  Pt will decrease pain in LUE to 3/10 or less to improve ability to sleep for 2+ consecutive hours without waking due to pain.   Goal status: IN PROGRESS  2.  Pt will decrease LUE fascial restrictions to min amounts or less to improve mobility required for functional reaching tasks.   Goal status: IN PROGRESS  3.  Pt will  increase LUE A/ROM by 50 degrees to improve ability to use LUE when reaching overhead or behind back during dressing and bathing tasks.   Goal status: IN PROGRESS  4.  Pt will increase LUE strength to 4+/5 or greater to improve ability to use LUE when lifting or carrying items during meal preparation/housework/yardwork tasks.   Goal status: IN PROGRESS  5.  Pt will return to highest level of function using LUE as dominant/non-dominant during functional task completion.   Goal status: IN PROGRESS   ASSESSMENT:  CLINICAL IMPRESSION: This session pt demonstrating continued stiffness in her shoulder, specifically against gravity. In supine she is able to achieve 75% of full ROM actively with minimal pain. Pt also able to tolerate pulleys in sitting, where she also achieved 75% of full ROM. Verbal and tactile cuing for positioning and technique throughout session.   PERFORMANCE DEFICITS: in functional skills including in functional skills including ADLs, IADLs, coordination, tone, ROM, strength, pain, fascial restrictions, muscle spasms, and UE functional use.   PLAN:  OT FREQUENCY: 2x/week  OT DURATION: 8 weeks  PLANNED INTERVENTIONS: 97168 OT Re-evaluation, 97535 self care/ADL training, 02889 therapeutic exercise, 97530 therapeutic activity, 97112 neuromuscular re-education, 97140 manual therapy, 97035 ultrasound, 97010 moist heat, 97032 electrical stimulation (manual), passive range of motion, functional mobility training, energy conservation, and coping strategies training  RECOMMENDED OTHER SERVICES: N/A  CONSULTED AND AGREED WITH PLAN OF CARE: Patient  PLAN FOR NEXT SESSION: Manual Therapy, P/ROM, provide theraputty   Valentin Nightingale, OTR/L [facility] [phone] Breckon Reeves Jillyn Nightingale, OT 05/26/2024, 10:07 AM    Managed Medicaid Authorization Request Treatment Start Date: 10/27/225  Visit Dx Codes: M25.511, M25.611, R29.898  Functional Tool Score: UEFS:  06-19-79  For all possible CPT codes, reference the Planned Interventions line above.     Check all conditions that are expected to impact treatment: {Conditions expected to impact treatment:None of these apply   If treatment provided at initial evaluation, no treatment charged  due to lack of authorization.

## 2024-05-29 ENCOUNTER — Telehealth: Payer: Self-pay | Admitting: Orthopedic Surgery

## 2024-05-29 NOTE — Telephone Encounter (Signed)
 Dr. Onesimo pt - pt lvm stating that she needs an estimated return to work date for her job.  [phone]

## 2024-05-30 ENCOUNTER — Ambulatory Visit (HOSPITAL_COMMUNITY): Admitting: Occupational Therapy

## 2024-05-30 NOTE — Telephone Encounter (Signed)
 Pt was wondering about light duty. 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 Would like to know if that would be acceptable. Pt was seen recently and her stiffness was still a concern, but would like to know about the light duty.

## 2024-06-02 ENCOUNTER — Ambulatory Visit (HOSPITAL_COMMUNITY): Admitting: Occupational Therapy

## 2024-06-02 ENCOUNTER — Encounter (HOSPITAL_COMMUNITY): Payer: Self-pay | Admitting: Occupational Therapy

## 2024-06-02 DIAGNOSIS — M25611 Stiffness of right shoulder, not elsewhere classified: Secondary | ICD-10-CM

## 2024-06-02 DIAGNOSIS — R29898 Other symptoms and signs involving the musculoskeletal system: Secondary | ICD-10-CM

## 2024-06-02 DIAGNOSIS — M25511 Pain in right shoulder: Secondary | ICD-10-CM | POA: Diagnosis not present

## 2024-06-02 NOTE — Therapy (Signed)
 OUTPATIENT OCCUPATIONAL THERAPY ORTHO TREATMENT NOTE  Patient Name: Carrie Mitchell MRN: [id number] DOB:13-May-1971, 53 y.o., female Today's Date: 06/02/2024   END OF SESSION:  OT End of Session - 06/02/24 0906     Visit Number 7    Number of Visits 16    Date for Recertification  06/17/24    Authorization Type BCBS    Authorization Time Period 10 visits (04/17/24-07/16/23)    Authorization - Visit Number 7    Authorization - Number of Visits 10    OT Start Time 0818    OT Stop Time 0901    OT Time Calculation (min) 43 min    Activity Tolerance Patient tolerated treatment well    Behavior During Therapy [facility] for tasks assessed/performed           Past Medical History:  Diagnosis Date   Cellulitis    Cholelithiasis 03/18/2013   Dermatitis 02/16/2012   Diabetes mellitus without complication (HCC)    High cholesterol    Hypertension    Obesity    Prediabetes    Vitamin D  deficiency    Past Surgical History:  Procedure Laterality Date   ARTHOSCOPIC ROTAOR CUFF REPAIR Left 03/06/2024   Procedure: REPAIR, ROTATOR CUFF, ARTHROSCOPIC;  Surgeon: Onesimo Oneil LABOR, MD;  Location: [facility];  Service: Orthopedics;  Laterality: Left;   BREAST SURGERY Right    cyst removal benign   CHOLECYSTECTOMY N/A 03/20/2013   Procedure: LAPAROSCOPIC CHOLECYSTECTOMY;  Surgeon: Thresa JAYSON Pulling, MD;  Location: [facility];  Service: General;  Laterality: N/A;   TUBAL LIGATION     Patient Active Problem List   Diagnosis Date Noted   Pain in left arm 12/14/2023   Hypertension 05/13/2023   Dysuria 04/17/2023   Chronic right shoulder pain 04/17/2023   Elevated BP without diagnosis of hypertension 05/30/2022   Cellulitis 01/30/2014   Urinary incontinence, urge 07/20/2013   Acute renal insufficiency 03/18/2013   Cholelithiasis 03/18/2013   Abdominal pain, right upper quadrant 03/18/2013   Cellulitis of leg, right 03/18/2013   Chest pain 03/17/2013   Nausea & vomiting 03/17/2013   Dermatitis  02/16/2012   Hypokalemia 02/14/2012   Obesity 11/09/2011   Degenerative joint disease of right knee 04/25/2009    PCP: Shona Norleen PEDLAR, MD REFERRING PROVIDER: Onesimo Oneil, MD  ONSET DATE: 03/06/24  REFERRING DIAG:  D53.987I (ICD-10-CM) - Traumatic complete tear of left rotator cuff, subsequent encounter    THERAPY DIAG:  Acute pain of right shoulder  Shoulder stiffness, right  Other symptoms and signs involving the musculoskeletal system  Rationale for Evaluation and Treatment: Rehabilitation  SUBJECTIVE:   SUBJECTIVE STATEMENT: I just can't get it lifted up. Pt accompanied by: self  PERTINENT HISTORY: Pt had a fall in June 2025, where she tore her L RC. She is now s/p Massive RCR. 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  PRECAUTIONS: Shoulder  WEIGHT BEARING RESTRICTIONS: Yes 1-2#  PAIN:  Are you having pain? Yes: NPRS scale: 1/10 Pain location: Anterior shoulder girdle Pain description: aching Aggravating factors: moving Relieving factors: medication  FALLS: Has patient fallen in last 6 months? Yes. Number of falls 1  PLOF: Independent  PATIENT GOALS: To get all independence back  NEXT MD VISIT: 05/23/24  OBJECTIVE:   HAND DOMINANCE: Left  ADLs: Overall ADLs: Pt requiring max assist with all dressing and bathing. Unable to cook or clean at this time.   FUNCTIONAL OUTCOME MEASURES: Upper Extremity Functional Scale (UEFS): 12/80  Extreme difficulty/unable (0), Quite a bit of difficulty (1), Moderate difficulty (2), Little difficulty (3), No difficulty (4) Survey date:  04/17/24  Any of your usual work, household or school activities 1  2. Your usual hobbies, recreational/sport activities 1   3. Lifting a bag of groceries to waist level 1   4. Lifting a bag of groceries above your head 0  5. Grooming your hair 0  6. Pushing up on your hands (I.e. from bathtub or chair) 1  7. Preparing food (I.e. peeling/cutting) 0  8. Driving   2  9. Vacuuming, sweeping, or raking 0  10. Dressing  1  11. Doing up buttons 2  12. Using tools/appliances 1  13. Opening doors 1  14. Cleaning  0  15. Tying or lacing shoes 0  16. Sleeping  1  17. Laundering clothes (I.e. washing, ironing, folding) 0  18. Opening a jar 0  19. Throwing a ball 0  20. Carrying a small suitcase with your affected limb.  0  Score total:  12/80     UPPER EXTREMITY ROM:       Assessed in seated, er/IR adducted  Passive ROM Left eval  Shoulder flexion 91  Shoulder abduction 95  Shoulder internal rotation 90  Shoulder external rotation 27  (Blank rows = not tested)    UPPER EXTREMITY MMT:     Assessed in seated, er/IR adducted  MMT Left eval  Shoulder flexion   Shoulder abduction   Shoulder internal rotation   Shoulder external rotation   (Blank rows = not tested)  HAND FUNCTION: Grip strength: Left: 14 lbs  SENSATION: WFL  EDEMA: No swelling noted  OBSERVATIONS: Pt has moderate to severe fascial restictions along her bicep, scapular region, and trapezius.    TODAY'S TREATMENT:                                                                                                                              DATE:   06/02/24 -Manual therapy: myofascial release and trigger point applied to biceps, scapular region, and trapezius in order to reduce fascial restrictions and pain, as well as improve ROM -P/ROM: supine - flexion, abduction, horizontal abduction, er/IR, x6 -A/ROM: supine - flexion, abduction, horizontal abduction, er/IR, x10 -A/ROM: side lying - 1#, flexion, abduction, horizontal abduction, er/IR, x10 -Wall climbs, flexion, x10  05/26/24 -Manual therapy: myofascial release and trigger point applied to biceps, scapular region, and trapezius in order to reduce fascial restrictions and pain, as well as improve ROM -P/ROM: supine - flexion, abduction, horizontal abduction, er/IR, x10 -A/ROM: supine - flexion, abduction,  horizontal abduction, er/IR, x10 -Pulleys: flexion, abduction, x10  05/24/24 -Manual therapy: myofascial release and trigger point applied to biceps, scapular region, and trapezius in order to reduce fascial restrictions and pain, as well as improve ROM -P/ROM: supine - flexion, abduction, horizontal abduction, er/IR, x10 -AA/ROM: supine - OT assisted as pt is unable to hold dowel - Flexion,  abduction, protraction, horizontal abduction, er/IR, x10 -Pulleys: flexion, abduction, x10 -Theraball Exercise: protraction, flexion, x10   PATIENT EDUCATION: Education details: Continue HEP Person educated: Patient Education method: Programmer, Multimedia, Demonstration, and Handouts Education comprehension: verbalized understanding and returned demonstration  HOME EXERCISE PROGRAM: 05/14/2024: Wrist ROM, table slides, pendulums 12/5: A/ROM  GOALS: Goals reviewed with patient? Yes   SHORT TERM GOALS: Target date: 05/18/24  Pt will be provided with and educated on HEP to improve mobility in LUE required for use during ADL completion.   Goal status: IN PROGRESS  2.  Pt will increase LUE P/ROM by 40 degrees to improve ability to use LUE during dressing tasks with minimal compensatory techniques.   Goal status: IN PROGRESS  3.  Pt will increase LUE strength to 3+/5 to improve ability to reach for items at waist to chest height during bathing and grooming tasks.   Goal status: IN PROGRESS  4. Pt will increase LUE grip strength by 20lbs in order to grasp and hold items needed for cooking and cleaning.    Goal status: IN PROGRESS   LONG TERM GOALS: Target date: 06/17/24  Pt will decrease pain in LUE to 3/10 or less to improve ability to sleep for 2+ consecutive hours without waking due to pain.   Goal status: IN PROGRESS  2.  Pt will decrease LUE fascial restrictions to min amounts or less to improve mobility required for functional reaching tasks.   Goal status: IN PROGRESS  3.  Pt will increase  LUE A/ROM by 50 degrees to improve ability to use LUE when reaching overhead or behind back during dressing and bathing tasks.   Goal status: IN PROGRESS  4.  Pt will increase LUE strength to 4+/5 or greater to improve ability to use LUE when lifting or carrying items during meal preparation/housework/yardwork tasks.   Goal status: IN PROGRESS  5.  Pt will return to highest level of function using LUE as dominant/non-dominant during functional task completion.   Goal status: IN PROGRESS   ASSESSMENT:  CLINICAL IMPRESSION: Pt continues to struggle with active ROM above her head. This session OT had her working on side lying ROM while holding a 1# dumbbell, where she demonstrated improved mobility and achieved 75% of full ROM in all directions. Once sitting/standing motion is still limited. OT providing verbal and tactile cuing for positioning and technique.   PERFORMANCE DEFICITS: in functional skills including in functional skills including ADLs, IADLs, coordination, tone, ROM, strength, pain, fascial restrictions, muscle spasms, and UE functional use.   PLAN:  OT FREQUENCY: 2x/week  OT DURATION: 8 weeks  PLANNED INTERVENTIONS: 97168 OT Re-evaluation, 97535 self care/ADL training, 02889 therapeutic exercise, 97530 therapeutic activity, 97112 neuromuscular re-education, 97140 manual therapy, 97035 ultrasound, 97010 moist heat, 97032 electrical stimulation (manual), passive range of motion, functional mobility training, energy conservation, and coping strategies training  RECOMMENDED OTHER SERVICES: N/A  CONSULTED AND AGREED WITH PLAN OF CARE: Patient  PLAN FOR NEXT SESSION: Manual Therapy, P/ROM, provide theraputty   Valentin Nightingale, OTR/L [facility] [phone] [place], OT 06/02/2024, 9:07 AM    Managed Medicaid Authorization Request Treatment Start Date: 10/27/225  Visit Dx Codes: M25.511, M25.611, R29.898  Functional Tool Score: UEFS:  07/02/79  For all possible CPT codes, reference the Planned Interventions line above.     Check all conditions that are expected to impact treatment: {Conditions expected to impact treatment:None of these apply   If treatment provided at initial evaluation, no treatment charged due  to lack of authorization.

## 2024-06-06 ENCOUNTER — Encounter (HOSPITAL_COMMUNITY): Payer: Self-pay | Admitting: Occupational Therapy

## 2024-06-06 ENCOUNTER — Ambulatory Visit (HOSPITAL_COMMUNITY): Admitting: Occupational Therapy

## 2024-06-06 DIAGNOSIS — M25511 Pain in right shoulder: Secondary | ICD-10-CM | POA: Diagnosis not present

## 2024-06-06 DIAGNOSIS — M25611 Stiffness of right shoulder, not elsewhere classified: Secondary | ICD-10-CM

## 2024-06-06 DIAGNOSIS — R29898 Other symptoms and signs involving the musculoskeletal system: Secondary | ICD-10-CM

## 2024-06-06 NOTE — Therapy (Signed)
 OUTPATIENT OCCUPATIONAL THERAPY ORTHO TREATMENT NOTE  Patient Name: Carrie Mitchell MRN: [id number] DOB:11-19-1970, 53 y.o., female Today's Date: 06/06/2024   END OF SESSION:  OT End of Session - 06/06/24 1126     Visit Number 8    Number of Visits 16    Date for Recertification  06/17/24    Authorization Type BCBS    Authorization Time Period 10 visits (04/17/24-07/16/23)    Authorization - Visit Number 8    Authorization - Number of Visits 10    OT Start Time 0822    OT Stop Time 0902    OT Time Calculation (min) 40 min    Activity Tolerance Patient tolerated treatment well    Behavior During Therapy [name] for tasks assessed/performed          Past Medical History:  Diagnosis Date   Cellulitis    Cholelithiasis 03/18/2013   Dermatitis 02/16/2012   Diabetes mellitus without complication (HCC)    High cholesterol    Hypertension    Obesity    Prediabetes    Vitamin D  deficiency    Past Surgical History:  Procedure Laterality Date   ARTHOSCOPIC ROTAOR CUFF REPAIR Left 03/06/2024   Procedure: REPAIR, ROTATOR CUFF, ARTHROSCOPIC;  Surgeon: Carrie Mitchell LABOR, MD;  Location: [facility];  Service: Orthopedics;  Laterality: Left;   BREAST SURGERY Right    cyst removal benign   CHOLECYSTECTOMY N/A 03/20/2013   Procedure: LAPAROSCOPIC CHOLECYSTECTOMY;  Surgeon: Carrie Carrie Pulling, MD;  Location: [facility];  Service: General;  Laterality: N/A;   TUBAL LIGATION     Patient Active Problem List   Diagnosis Date Noted   Pain in left arm 12/14/2023   Hypertension 05/13/2023   Dysuria 04/17/2023   Chronic right shoulder pain 04/17/2023   Elevated BP without diagnosis of hypertension 05/30/2022   Cellulitis 01/30/2014   Urinary incontinence, urge 07/20/2013   Acute renal insufficiency 03/18/2013   Cholelithiasis 03/18/2013   Abdominal pain, right upper quadrant 03/18/2013   Cellulitis of leg, right 03/18/2013   Chest pain 03/17/2013   Nausea & vomiting 03/17/2013   Dermatitis 02/16/2012    Hypokalemia 02/14/2012   Obesity 11/09/2011   Degenerative joint disease of right knee 04/25/2009    PCP: Carrie Norleen PEDLAR, MD REFERRING PROVIDER: Onesimo Oneil, MD  ONSET DATE: 03/06/24  REFERRING DIAG:  D53.987I (ICD-10-CM) - Traumatic complete tear of left rotator cuff, subsequent encounter    THERAPY DIAG:  Acute pain of right shoulder  Shoulder stiffness, right  Other symptoms and signs involving the musculoskeletal system  Rationale for Evaluation and Treatment: Rehabilitation  SUBJECTIVE:   SUBJECTIVE STATEMENT: Reaching up is just weak.  Pt accompanied by: self  PERTINENT HISTORY: Pt had a fall in June 2025, where she tore her L RC. She is now s/p Massive RCR. 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  PRECAUTIONS: Shoulder  WEIGHT BEARING RESTRICTIONS: Yes 1-2#  PAIN:  Are you having pain? Yes: NPRS scale: 1/10 Pain location: Anterior shoulder girdle Pain description: aching Aggravating factors: moving Relieving factors: medication  FALLS: Has patient fallen in last 6 months? Yes. Number of falls 1  PLOF: Independent  PATIENT GOALS: To get all independence back  NEXT MD VISIT: 05/23/24  OBJECTIVE:   HAND DOMINANCE: Left  ADLs: Overall ADLs: Pt requiring max assist with all dressing and bathing. Unable to cook or clean at this time.   FUNCTIONAL OUTCOME MEASURES: Upper Extremity Functional Scale (UEFS): 12/80   Extreme  difficulty/unable (0), Quite a bit of difficulty (1), Moderate difficulty (2), Little difficulty (3), No difficulty (4) Survey date:  04/17/24  Any of your usual work, household or school activities 1  2. Your usual hobbies, recreational/sport activities 1   3. Lifting a bag of groceries to waist level 1   4. Lifting a bag of groceries above your head 0  5. Grooming your hair 0  6. Pushing up on your hands (I.e. from bathtub or chair) 1  7. Preparing food (I.e. peeling/cutting) 0  8. Driving  2  9.  Vacuuming, sweeping, or raking 0  10. Dressing  1  11. Doing up buttons 2  12. Using tools/appliances 1  13. Opening doors 1  14. Cleaning  0  15. Tying or lacing shoes 0  16. Sleeping  1  17. Laundering clothes (I.e. washing, ironing, folding) 0  18. Opening a jar 0  19. Throwing a ball 0  20. Carrying a small suitcase with your affected limb.  0  Score total:  12/80     UPPER EXTREMITY ROM:       Assessed in seated, er/IR adducted  Passive ROM Left eval  Shoulder flexion 91  Shoulder abduction 95  Shoulder internal rotation 90  Shoulder external rotation 27  (Blank rows = not tested)    UPPER EXTREMITY MMT:     Assessed in seated, er/IR adducted  MMT Left eval  Shoulder flexion   Shoulder abduction   Shoulder internal rotation   Shoulder external rotation   (Blank rows = not tested)  HAND FUNCTION: Grip strength: Left: 14 lbs  SENSATION: WFL  EDEMA: No swelling noted  OBSERVATIONS: Pt has moderate to severe fascial restictions along her bicep, scapular region, and trapezius.    TODAY'S TREATMENT:                                                                                                                              DATE:   06/06/24 -Manual therapy: myofascial release and trigger point applied to biceps, scapular region, and trapezius in order to reduce fascial restrictions and pain, as well as improve ROM -P/ROM: supine - flexion, abduction, horizontal abduction, er/IR, x6 -A/ROM: supine - flexion, abduction, horizontal abduction, er/IR, x10 -Scapular Strengthening: red, extension, retraction, rows, x15  06/02/24 -Manual therapy: myofascial release and trigger point applied to biceps, scapular region, and trapezius in order to reduce fascial restrictions and pain, as well as improve ROM -P/ROM: supine - flexion, abduction, horizontal abduction, er/IR, x6 -A/ROM: supine - flexion, abduction, horizontal abduction, er/IR, x10 -A/ROM: side lying -  1#, flexion, abduction, horizontal abduction, er/IR, x10 -Wall climbs, flexion, x10  05/26/24 -Manual therapy: myofascial release and trigger point applied to biceps, scapular region, and trapezius in order to reduce fascial restrictions and pain, as well as improve ROM -P/ROM: supine - flexion, abduction, horizontal abduction, er/IR, x10 -A/ROM: supine - flexion, abduction, horizontal abduction, er/IR, x10 -Pulleys: flexion, abduction,  x10   PATIENT EDUCATION: Education details: Continue HEP Person educated: Patient Education method: Programmer, Multimedia, Demonstration, and Handouts Education comprehension: verbalized understanding and returned demonstration  HOME EXERCISE PROGRAM: Apr 19, 2024: Wrist ROM, table slides, pendulums 12/5: A/ROM  GOALS: Goals reviewed with patient? Yes   SHORT TERM GOALS: Target date: 05/18/24  Pt will be provided with and educated on HEP to improve mobility in LUE required for use during ADL completion.   Goal status: IN PROGRESS  2.  Pt will increase LUE P/ROM by 40 degrees to improve ability to use LUE during dressing tasks with minimal compensatory techniques.   Goal status: IN PROGRESS  3.  Pt will increase LUE strength to 3+/5 to improve ability to reach for items at waist to chest height during bathing and grooming tasks.   Goal status: IN PROGRESS  4. Pt will increase LUE grip strength by 20lbs in order to grasp and hold items needed for cooking and cleaning.    Goal status: IN PROGRESS   LONG TERM GOALS: Target date: 06/17/24  Pt will decrease pain in LUE to 3/10 or less to improve ability to sleep for 2+ consecutive hours without waking due to pain.   Goal status: IN PROGRESS  2.  Pt will decrease LUE fascial restrictions to min amounts or less to improve mobility required for functional reaching tasks.   Goal status: IN PROGRESS  3.  Pt will increase LUE A/ROM by 50 degrees to improve ability to use LUE when reaching overhead or behind  back during dressing and bathing tasks.   Goal status: IN PROGRESS  4.  Pt will increase LUE strength to 4+/5 or greater to improve ability to use LUE when lifting or carrying items during meal preparation/housework/yardwork tasks.   Goal status: IN PROGRESS  5.  Pt will return to highest level of function using LUE as dominant/non-dominant during functional task completion.   Goal status: IN PROGRESS   ASSESSMENT:  CLINICAL IMPRESSION: This session pt presented with moderate fascial restrictions along her bicep and trapezius, increasing her overall pain, which was addressed with manual therapy. Pt then demonstrating improved A/ROM in supine, where she was able to achieve ~65% of full ROM. OT added scapular strengthening for improved overall strengthening. Verbal and tactile cuing provided for positioning and technique.   PERFORMANCE DEFICITS: in functional skills including in functional skills including ADLs, IADLs, coordination, tone, ROM, strength, pain, fascial restrictions, muscle spasms, and UE functional use.   PLAN:  OT FREQUENCY: 2x/week  OT DURATION: 8 weeks  PLANNED INTERVENTIONS: 97168 OT Re-evaluation, 97535 self care/ADL training, 02889 therapeutic exercise, 97530 therapeutic activity, 97112 neuromuscular re-education, 97140 manual therapy, 97035 ultrasound, 97010 moist heat, 97032 electrical stimulation (manual), passive range of motion, functional mobility training, energy conservation, and coping strategies training  RECOMMENDED OTHER SERVICES: N/A  CONSULTED AND AGREED WITH PLAN OF CARE: Patient  PLAN FOR NEXT SESSION: Manual Therapy, P/ROM, provide theraputty   Valentin Nightingale, OTR/L [facility] [phone] Khup Sapia Jillyn Nightingale, OT 06/06/2024, 11:27 AM    Managed Medicaid Authorization Request Treatment Start Date: 10/27/225  Visit Dx Codes: M25.511, M25.611, R29.898  Functional Tool Score: UEFS: Jun [birth year], 1979  For all possible CPT codes,  reference the Planned Interventions line above.     Check all conditions that are expected to impact treatment: {Conditions expected to impact treatment:None of these apply   If treatment provided at initial evaluation, no treatment charged due to lack of authorization.

## 2024-06-06 NOTE — Patient Instructions (Signed)

## 2024-06-09 ENCOUNTER — Ambulatory Visit (HOSPITAL_COMMUNITY): Admitting: Occupational Therapy

## 2024-06-09 DIAGNOSIS — R29898 Other symptoms and signs involving the musculoskeletal system: Secondary | ICD-10-CM

## 2024-06-09 DIAGNOSIS — M25511 Pain in right shoulder: Secondary | ICD-10-CM

## 2024-06-09 DIAGNOSIS — M25611 Stiffness of right shoulder, not elsewhere classified: Secondary | ICD-10-CM

## 2024-06-09 NOTE — Therapy (Unsigned)
 " OUTPATIENT OCCUPATIONAL THERAPY ORTHO TREATMENT NOTE  Patient Name: Carrie Mitchell MRN: [id number] DOB:[birth year], 53 y.o., female Today's Date: 06/10/2024   END OF SESSION:    06/09/24 0908  OT Visits / Re-Eval  Visit Number 9  Number of Visits 16  Date for Recertification  06/17/24  Authorization  Authorization Type BCBS  Authorization Time Period 10 visits (04/17/24-07/16/23)  Authorization - Visit Number 9  Authorization - Number of Visits 10  OT Time Calculation  OT Start Time 0824  OT Stop Time 0908  OT Time Calculation (min) 44 min  End of Session  Activity Tolerance Patient tolerated treatment well  Behavior During Therapy [facility] for tasks assessed/performed    Past Medical History:  Diagnosis Date   Cellulitis    Cholelithiasis 03/18/2013   Dermatitis 02/16/2012   Diabetes mellitus without complication (HCC)    High cholesterol    Hypertension    Obesity    Prediabetes    Vitamin D  deficiency    Past Surgical History:  Procedure Laterality Date   ARTHOSCOPIC ROTAOR CUFF REPAIR Left 03/06/2024   Procedure: REPAIR, ROTATOR CUFF, ARTHROSCOPIC;  Surgeon: Onesimo Oneil LABOR, MD;  Location: [facility];  Service: Orthopedics;  Laterality: Left;   BREAST SURGERY Right    cyst removal benign   CHOLECYSTECTOMY N/A 03/20/2013   Procedure: LAPAROSCOPIC CHOLECYSTECTOMY;  Surgeon: Thresa JAYSON Pulling, MD;  Location: [facility];  Service: General;  Laterality: N/A;   TUBAL LIGATION     Patient Active Problem List   Diagnosis Date Noted   Pain in left arm 12/14/2023   Hypertension 05/13/2023   Dysuria 04/17/2023   Chronic right shoulder pain 04/17/2023   Elevated BP without diagnosis of hypertension 05/30/2022   Cellulitis 01/30/2014   Urinary incontinence, urge 07/20/2013   Acute renal insufficiency 03/18/2013   Cholelithiasis 03/18/2013   Abdominal pain, right upper quadrant 03/18/2013   Cellulitis of leg, right 03/18/2013   Chest pain 03/17/2013   Nausea & vomiting  03/17/2013   Dermatitis 02/16/2012   Hypokalemia 02/14/2012   Obesity 11/09/2011   Degenerative joint disease of right knee 04/25/2009    PCP: Shona Norleen PEDLAR, MD REFERRING PROVIDER: Onesimo Oneil, MD  ONSET DATE: 03/06/24  REFERRING DIAG:  D53.987I (ICD-10-CM) - Traumatic complete tear of left rotator cuff, subsequent encounter    THERAPY DIAG:  Shoulder stiffness, right  Acute pain of right shoulder  Other symptoms and signs involving the musculoskeletal system  Rationale for Evaluation and Treatment: Rehabilitation  SUBJECTIVE:   SUBJECTIVE STATEMENT: Reaching up is just weak.  Pt accompanied by: self  PERTINENT HISTORY: Pt had a fall in June 2025, where she tore her L RC. She is now s/p Massive RCR. 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  PRECAUTIONS: Shoulder  WEIGHT BEARING RESTRICTIONS: Yes 1-2#  PAIN:  Are you having pain? Yes: NPRS scale: 1/10 Pain location: Anterior shoulder girdle Pain description: aching Aggravating factors: moving Relieving factors: medication  FALLS: Has patient fallen in last 6 months? Yes. Number of falls 1  PLOF: Independent  PATIENT GOALS: To get all independence back  NEXT MD VISIT: 05/23/24  OBJECTIVE:   HAND DOMINANCE: Left  ADLs: Overall ADLs: Pt requiring max assist with all dressing and bathing. Unable to cook or clean at this time.   FUNCTIONAL OUTCOME MEASURES: Upper Extremity Functional Scale (UEFS): 12/80   Extreme difficulty/unable (0), Quite a bit of difficulty (1), Moderate difficulty (2), Little difficulty (3), No difficulty (4) Survey  date:  04/17/24  Any of your usual work, household or school activities 1  2. Your usual hobbies, recreational/sport activities 1   3. Lifting a bag of groceries to waist level 1   4. Lifting a bag of groceries above your head 0  5. Grooming your hair 0  6. Pushing up on your hands (I.e. from bathtub or chair) 1  7. Preparing food (I.e.  peeling/cutting) 0  8. Driving  2  9. Vacuuming, sweeping, or raking 0  10. Dressing  1  11. Doing up buttons 2  12. Using tools/appliances 1  13. Opening doors 1  14. Cleaning  0  15. Tying or lacing shoes 0  16. Sleeping  1  17. Laundering clothes (I.e. washing, ironing, folding) 0  18. Opening a jar 0  19. Throwing a ball 0  20. Carrying a small suitcase with your affected limb.  0  Score total:  12/80     UPPER EXTREMITY ROM:       Assessed in seated, er/IR adducted  Passive ROM Left eval  Shoulder flexion 91  Shoulder abduction 95  Shoulder internal rotation 90  Shoulder external rotation 27  (Blank rows = not tested)    UPPER EXTREMITY MMT:     Assessed in seated, er/IR adducted  MMT Left eval  Shoulder flexion   Shoulder abduction   Shoulder internal rotation   Shoulder external rotation   (Blank rows = not tested)  HAND FUNCTION: Grip strength: Left: 14 lbs  SENSATION: WFL  EDEMA: No swelling noted  OBSERVATIONS: Pt has moderate to severe fascial restictions along her bicep, scapular region, and trapezius.    TODAY'S TREATMENT:                                                                                                                              DATE:   06/09/24 -Manual therapy: myofascial release and trigger point applied to biceps, scapular region, and trapezius in order to reduce fascial restrictions and pain, as well as improve ROM -P/ROM: supine - flexion, abduction, horizontal abduction, er/IR, x6 -A/ROM: supine - flexion, abduction, horizontal abduction, er/IR, x10 -Scapular Strengthening: red, extension, retraction, rows, x15 -Shoulder strengthening: red, horizontal abduction, er, IR, x15  06/06/24 -Manual therapy: myofascial release and trigger point applied to biceps, scapular region, and trapezius in order to reduce fascial restrictions and pain, as well as improve ROM -P/ROM: supine - flexion, abduction, horizontal abduction,  er/IR, x6 -A/ROM: supine - flexion, abduction, horizontal abduction, er/IR, x10 -Scapular Strengthening: red, extension, retraction, rows, x15  06/02/24 -Manual therapy: myofascial release and trigger point applied to biceps, scapular region, and trapezius in order to reduce fascial restrictions and pain, as well as improve ROM -P/ROM: supine - flexion, abduction, horizontal abduction, er/IR, x6 -A/ROM: supine - flexion, abduction, horizontal abduction, er/IR, x10 -A/ROM: side lying - 1#, flexion, abduction, horizontal abduction, er/IR, x10 -Wall climbs, flexion, x10  PATIENT EDUCATION: Education details: Continue  HEP Person educated: Patient Education method: Explanation, Demonstration, and Handouts Education comprehension: verbalized understanding and returned demonstration  HOME EXERCISE PROGRAM: 05/08/24: Wrist ROM, table slides, pendulums 12/5: A/ROM  GOALS: Goals reviewed with patient? Yes   SHORT TERM GOALS: Target date: 05/18/24  Pt will be provided with and educated on HEP to improve mobility in LUE required for use during ADL completion.   Goal status: IN PROGRESS  2.  Pt will increase LUE P/ROM by 40 degrees to improve ability to use LUE during dressing tasks with minimal compensatory techniques.   Goal status: IN PROGRESS  3.  Pt will increase LUE strength to 3+/5 to improve ability to reach for items at waist to chest height during bathing and grooming tasks.   Goal status: IN PROGRESS  4. Pt will increase LUE grip strength by 20lbs in order to grasp and hold items needed for cooking and cleaning.    Goal status: IN PROGRESS   LONG TERM GOALS: Target date: 06/17/24  Pt will decrease pain in LUE to 3/10 or less to improve ability to sleep for 2+ consecutive hours without waking due to pain.   Goal status: IN PROGRESS  2.  Pt will decrease LUE fascial restrictions to min amounts or less to improve mobility required for functional reaching tasks.   Goal  status: IN PROGRESS  3.  Pt will increase LUE A/ROM by 50 degrees to improve ability to use LUE when reaching overhead or behind back during dressing and bathing tasks.   Goal status: IN PROGRESS  4.  Pt will increase LUE strength to 4+/5 or greater to improve ability to use LUE when lifting or carrying items during meal preparation/housework/yardwork tasks.   Goal status: IN PROGRESS  5.  Pt will return to highest level of function using LUE as dominant/non-dominant during functional task completion.   Goal status: IN PROGRESS   ASSESSMENT:  CLINICAL IMPRESSION: Pt continues to work on her ROM, which continues to be limited against gravity when completed actively. She is able to achieve approximately 60-65% of full ROM in all motions. OT continued pt on scapular and lower level shoulder strengthening, in order to improve supporting muscles to assist ROM. Verbal and tactile cuing provided for positioning and technique.   PERFORMANCE DEFICITS: in functional skills including in functional skills including ADLs, IADLs, coordination, tone, ROM, strength, pain, fascial restrictions, muscle spasms, and UE functional use.   PLAN:  OT FREQUENCY: 2x/week  OT DURATION: 8 weeks  PLANNED INTERVENTIONS: 97168 OT Re-evaluation, 97535 self care/ADL training, 02889 therapeutic exercise, 97530 therapeutic activity, 97112 neuromuscular re-education, 97140 manual therapy, 97035 ultrasound, 97010 moist heat, 97032 electrical stimulation (manual), passive range of motion, functional mobility training, energy conservation, and coping strategies training  RECOMMENDED OTHER SERVICES: N/A  CONSULTED AND AGREED WITH PLAN OF CARE: Patient  PLAN FOR NEXT SESSION: Manual Therapy, P/ROM, provide theraputty   Valentin Nightingale, OTR/L [facility] [phone] Kyiesha [place] Jillyn Nightingale, OT 06/10/2024, 10:30 PM    Managed Medicaid Authorization Request Treatment Start Date: 10/27/225  Visit Dx  Codes: M25.511, M25.611, R29.898  Functional Tool Score: UEFS: 07-11-1979  For all possible CPT codes, reference the Planned Interventions line above.     Check all conditions that are expected to impact treatment: {Conditions expected to impact treatment:None of these apply   If treatment provided at initial evaluation, no treatment charged due to lack of authorization.     "

## 2024-06-13 ENCOUNTER — Ambulatory Visit (HOSPITAL_COMMUNITY): Admitting: Occupational Therapy

## 2024-06-13 ENCOUNTER — Encounter (HOSPITAL_COMMUNITY): Payer: Self-pay | Admitting: Occupational Therapy

## 2024-06-13 DIAGNOSIS — M25511 Pain in right shoulder: Secondary | ICD-10-CM | POA: Diagnosis not present

## 2024-06-13 DIAGNOSIS — R29898 Other symptoms and signs involving the musculoskeletal system: Secondary | ICD-10-CM

## 2024-06-13 DIAGNOSIS — M25611 Stiffness of right shoulder, not elsewhere classified: Secondary | ICD-10-CM

## 2024-06-13 NOTE — Therapy (Addendum)
 " OUTPATIENT OCCUPATIONAL THERAPY ORTHO TREATMENT NOTE PROGRESS NOTE  Patient Name: Carrie Mitchell MRN: [id number] DOB:1970/08/22, 53 y.o., female Today's Date: 06/13/2024  Progress Note Reporting Period 04/17/24 to 06/13/24  See note below for Objective Data and Assessment of Progress/Goals.    END OF SESSION:  OT End of Session - 06/13/24 0910     Visit Number 10    Number of Visits 16    Date for Recertification  07/21/24    Authorization Type BCBS    Authorization Time Period 10 visits (04/17/24-07/16/23)    Authorization - Visit Number 10    Authorization - Number of Visits 10    OT Start Time 0822    OT Stop Time 0905    OT Time Calculation (min) 43 min    Activity Tolerance Patient tolerated treatment well    Behavior During Therapy [facility] for tasks assessed/performed          Past Medical History:  Diagnosis Date   Cellulitis    Cholelithiasis 03/18/2013   Dermatitis 02/16/2012   Diabetes mellitus without complication (HCC)    High cholesterol    Hypertension    Obesity    Prediabetes    Vitamin D  deficiency    Past Surgical History:  Procedure Laterality Date   ARTHOSCOPIC ROTAOR CUFF REPAIR Left 03/06/2024   Procedure: REPAIR, ROTATOR CUFF, ARTHROSCOPIC;  Surgeon: Onesimo Oneil LABOR, MD;  Location: [facility];  Service: Orthopedics;  Laterality: Left;   BREAST SURGERY Right    cyst removal benign   CHOLECYSTECTOMY N/A 03/20/2013   Procedure: LAPAROSCOPIC CHOLECYSTECTOMY;  Surgeon: Thresa JAYSON Pulling, MD;  Location: [facility];  Service: General;  Laterality: N/A;   TUBAL LIGATION     Patient Active Problem List   Diagnosis Date Noted   Pain in left arm 12/14/2023   Hypertension 05/13/2023   Dysuria 04/17/2023   Chronic right shoulder pain 04/17/2023   Elevated BP without diagnosis of hypertension 05/30/2022   Cellulitis 01/30/2014   Urinary incontinence, urge 07/20/2013   Acute renal insufficiency 03/18/2013   Cholelithiasis 03/18/2013   Abdominal pain, right  upper quadrant 03/18/2013   Cellulitis of leg, right 03/18/2013   Chest pain 03/17/2013   Nausea & vomiting 03/17/2013   Dermatitis 02/16/2012   Hypokalemia 02/14/2012   Obesity 11/09/2011   Degenerative joint disease of right knee 04/25/2009    PCP: Shona Norleen PEDLAR, MD REFERRING PROVIDER: Onesimo Oneil, MD  ONSET DATE: 03/06/24  REFERRING DIAG:  D53.987I (ICD-10-CM) - Traumatic complete tear of left rotator cuff, subsequent encounter    THERAPY DIAG:  Shoulder stiffness, right  Acute pain of right shoulder  Other symptoms and signs involving the musculoskeletal system  Rationale for Evaluation and Treatment: Rehabilitation  SUBJECTIVE:   SUBJECTIVE STATEMENT: I can touch my head now Pt accompanied by: self  PERTINENT HISTORY: Pt had a fall in June 2025, where she tore her L RC. She is now s/p Massive RCR. 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  PRECAUTIONS: Shoulder  WEIGHT BEARING RESTRICTIONS: Yes 1-2#  PAIN:  Are you having pain? Yes: NPRS scale: 1/10 Pain location: Anterior shoulder girdle Pain description: aching Aggravating factors: moving Relieving factors: medication  FALLS: Has patient fallen in last 6 months? Yes. Number of falls 1  PLOF: Independent  PATIENT GOALS: To get all independence back  NEXT MD VISIT: 05/23/24  OBJECTIVE:   HAND DOMINANCE: Left  ADLs: Overall ADLs: Pt requiring max assist with all dressing and  bathing. Unable to cook or clean at this time.   FUNCTIONAL OUTCOME MEASURES: Upper Extremity Functional Scale (UEFS): 12/80   Extreme difficulty/unable (0), Quite a bit of difficulty (1), Moderate difficulty (2), Little difficulty (3), No difficulty (4) Survey date:  04/17/24 06/13/24  Any of your usual work, household or school activities 1 2  2. Your usual hobbies, recreational/sport activities 1 2   3. Lifting a bag of groceries to waist level 1 2   4. Lifting a bag of groceries above your head 0  0  5. Grooming your hair 0 1  6. Pushing up on your hands (I.e. from bathtub or chair) 1 3  7. Preparing food (I.e. peeling/cutting) 0 3  8. Driving  2 4  9. Vacuuming, sweeping, or raking 0 2  10. Dressing  1 3  11. Doing up buttons 2 4  12. Using tools/appliances 1 2  13. Opening doors 1 3  14. Cleaning  0 2  15. Tying or lacing shoes 0 4  16. Sleeping  1 3  17. Laundering clothes (I.e. washing, ironing, folding) 0 2  18. Opening a jar 0 1  19. Throwing a ball 0 1  20. Carrying a small suitcase with your affected limb.  0 1  Score total:  12/80 45/80     UPPER EXTREMITY ROM:       Assessed in supine, er/IR adducted  Passive ROM Left eval Left 06/13/24  Shoulder flexion 91 133  Shoulder abduction 95 128  Shoulder internal rotation 90 90  Shoulder external rotation 27 58  (Blank rows = not tested)  Assessed in seated, er/IR adducted  Active ROM Left eval Left 06/13/24  Shoulder flexion  107  Shoulder abduction  93  Shoulder internal rotation  90  Shoulder external rotation  52  (Blank rows = not tested)    UPPER EXTREMITY MMT:     Assessed in seated, er/IR adducted  MMT Left eval Left 06/13/24  Shoulder flexion  3+/5  Shoulder abduction  3/5  Shoulder internal rotation  4/5  Shoulder external rotation  3+/5  (Blank rows = not tested)  HAND FUNCTION: Grip strength: Left: 14 lbs  SENSATION: WFL  EDEMA: No swelling noted  OBSERVATIONS: Pt has moderate to severe fascial restictions along her bicep, scapular region, and trapezius.    TODAY'S TREATMENT:                                                                                                                              DATE:   06/13/24 -Manual therapy: myofascial release and trigger point applied to biceps, scapular region, and trapezius in order to reduce fascial restrictions and pain, as well as improve ROM -P/ROM: supine - flexion, abduction, horizontal abduction, er/IR, x6 -A/ROM:  supine - 1# dumbbell, flexion, abduction, horizontal abduction, er/IR, x10 -Proximal Shoulder Exercises: paddles, criss cross, circles both directions, x10 each -A/ROM: seated - flexion, abduction, horizontal abduction, er/IR,  x10  06/09/24 -Manual therapy: myofascial release and trigger point applied to biceps, scapular region, and trapezius in order to reduce fascial restrictions and pain, as well as improve ROM -P/ROM: supine - flexion, abduction, horizontal abduction, er/IR, x6 -A/ROM: supine - flexion, abduction, horizontal abduction, er/IR, x10 -Scapular Strengthening: red, extension, retraction, rows, x15 -Shoulder strengthening: red, horizontal abduction, er, IR, x15  06/06/24 -Manual therapy: myofascial release and trigger point applied to biceps, scapular region, and trapezius in order to reduce fascial restrictions and pain, as well as improve ROM -P/ROM: supine - flexion, abduction, horizontal abduction, er/IR, x6 -A/ROM: supine - flexion, abduction, horizontal abduction, er/IR, x10 -Scapular Strengthening: red, extension, retraction, rows, x15   PATIENT EDUCATION: Education details: Continue HEP Person educated: Patient Education method: Programmer, Multimedia, Demonstration, and Handouts Education comprehension: verbalized understanding and returned demonstration  HOME EXERCISE PROGRAM: 10/27: Wrist ROM, table slides, pendulums 12/5: A/ROM  GOALS: Goals reviewed with patient? Yes   SHORT TERM GOALS: Target date: 05/18/24  Pt will be provided with and educated on HEP to improve mobility in LUE required for use during ADL completion.   Goal status: IN PROGRESS  2.  Pt will increase LUE P/ROM by 40 degrees to improve ability to use LUE during dressing tasks with minimal compensatory techniques.   Goal status: IN PROGRESS  3.  Pt will increase LUE strength to 3+/5 to improve ability to reach for items at waist to chest height during bathing and grooming tasks.   Goal  status: IN PROGRESS  4. Pt will increase LUE grip strength by 20lbs in order to grasp and hold items needed for cooking and cleaning.    Goal status: IN PROGRESS   LONG TERM GOALS: Target date: 06/17/24  Pt will decrease pain in LUE to 3/10 or less to improve ability to sleep for 2+ consecutive hours without waking due to pain.   Goal status: IN PROGRESS  2.  Pt will decrease LUE fascial restrictions to min amounts or less to improve mobility required for functional reaching tasks.   Goal status: IN PROGRESS  3.  Pt will increase LUE A/ROM by 50 degrees to improve ability to use LUE when reaching overhead or behind back during dressing and bathing tasks.   Goal status: IN PROGRESS  4.  Pt will increase LUE strength to 4+/5 or greater to improve ability to use LUE when lifting or carrying items during meal preparation/housework/yardwork tasks.   Goal status: IN PROGRESS  5.  Pt will return to highest level of function using LUE as dominant/non-dominant during functional task completion.   Goal status: IN PROGRESS   ASSESSMENT:  CLINICAL IMPRESSION: This session pt presents with no pain and minimal fascial restrictions. She is demonstrating 75-80% of full ROM actively in supine, however once in sitting, pt continues to have difficulty, achieving only 60-65% of full ROM actively. Despite limited motion pt reports no increase in pain. OT providing verbal and tactile cuing for positioning and technique.  PERFORMANCE DEFICITS: in functional skills including in functional skills including ADLs, IADLs, coordination, tone, ROM, strength, pain, fascial restrictions, muscle spasms, and UE functional use.   PLAN:  OT FREQUENCY: 2x/week  OT DURATION: 8 weeks  PLANNED INTERVENTIONS: 97168 OT Re-evaluation, 97535 self care/ADL training, 02889 therapeutic exercise, 97530 therapeutic activity, 97112 neuromuscular re-education, 97140 manual therapy, 97035 ultrasound, 97010 moist heat, 97032  electrical stimulation (manual), passive range of motion, functional mobility training, energy conservation, and coping strategies training  RECOMMENDED OTHER SERVICES: N/A  CONSULTED AND  AGREED WITH PLAN OF CARE: Patient  PLAN FOR NEXT SESSION: Manual Therapy, P/ROM, provide theraputty   Valentin Nightingale, OTR/L [facility] [phone] [place], OT 06/13/2024, 9:12 AM    Managed Medicaid Authorization Request Treatment Start Date: July 09, 2024  Visit Dx Codes: M25.511, M25.611, R29.898  Functional Tool Score: UEFS: 45/80  For all possible CPT codes, reference the Planned Interventions line above.     Check all conditions that are expected to impact treatment: {Conditions expected to impact treatment:None of these apply   If treatment provided at initial evaluation, no treatment charged due to lack of authorization.     "

## 2024-06-19 ENCOUNTER — Ambulatory Visit (HOSPITAL_COMMUNITY): Admitting: Occupational Therapy

## 2024-06-19 ENCOUNTER — Encounter (HOSPITAL_COMMUNITY): Payer: Self-pay | Admitting: Occupational Therapy

## 2024-06-19 DIAGNOSIS — R29898 Other symptoms and signs involving the musculoskeletal system: Secondary | ICD-10-CM

## 2024-06-19 DIAGNOSIS — M25511 Pain in right shoulder: Secondary | ICD-10-CM | POA: Diagnosis not present

## 2024-06-19 DIAGNOSIS — M25611 Stiffness of right shoulder, not elsewhere classified: Secondary | ICD-10-CM

## 2024-06-19 NOTE — Patient Instructions (Signed)

## 2024-06-19 NOTE — Therapy (Signed)
 " OUTPATIENT OCCUPATIONAL THERAPY ORTHO TREATMENT NOTE  Patient Name: Carrie Mitchell MRN: [id number] DOB:1971-05-13, 52 y.o., female Today's Date: 06/19/2024   END OF SESSION:  OT End of Session - 06/19/24 0905     Visit Number 11    Number of Visits 16    Date for Recertification  07/21/24    Authorization Type [employer]    Authorization Time Period 10 visits (04/17/24-07/16/23), 4 visits (06/14/24-07/13/24)    Authorization - Visit Number 1    Authorization - Number of Visits 4    OT Start Time 0820    OT Stop Time 0905    OT Time Calculation (min) 45 min    Activity Tolerance Patient tolerated treatment well    Behavior During Therapy [facility] for tasks assessed/performed           Past Medical History:  Diagnosis Date   Cellulitis    Cholelithiasis 03/18/2013   Dermatitis 02/16/2012   Diabetes mellitus without complication (HCC)    High cholesterol    Hypertension    Obesity    Prediabetes    Vitamin D  deficiency    Past Surgical History:  Procedure Laterality Date   ARTHOSCOPIC ROTAOR CUFF REPAIR Left 03/06/2024   Procedure: REPAIR, ROTATOR CUFF, ARTHROSCOPIC;  Surgeon: Onesimo Oneil LABOR, MD;  Location: [facility];  Service: Orthopedics;  Laterality: Left;   BREAST SURGERY Right    cyst removal benign   CHOLECYSTECTOMY N/A 03/20/2013   Procedure: LAPAROSCOPIC CHOLECYSTECTOMY;  Surgeon: Thresa JAYSON Pulling, MD;  Location: [facility];  Service: General;  Laterality: N/A;   TUBAL LIGATION     Patient Active Problem List   Diagnosis Date Noted   Pain in left arm 12/14/2023   Hypertension 05/13/2023   Dysuria 04/17/2023   Chronic right shoulder pain 04/17/2023   Elevated BP without diagnosis of hypertension 05/30/2022   Cellulitis 01/30/2014   Urinary incontinence, urge 07/20/2013   Acute renal insufficiency 03/18/2013   Cholelithiasis 03/18/2013   Abdominal pain, right upper quadrant 03/18/2013   Cellulitis of leg, right 03/18/2013   Chest pain 03/17/2013   Nausea & vomiting  03/17/2013   Dermatitis 02/16/2012   Hypokalemia 02/14/2012   Obesity 11/09/2011   Degenerative joint disease of right knee 04/25/2009    PCP: Shona Norleen PEDLAR, MD REFERRING PROVIDER: Onesimo Oneil, MD  ONSET DATE: 03/06/24  REFERRING DIAG:  D53.987I (ICD-10-CM) - Traumatic complete tear of left rotator cuff, subsequent encounter    THERAPY DIAG:  Shoulder stiffness, right  Other symptoms and signs involving the musculoskeletal system  Acute pain of right shoulder  Rationale for Evaluation and Treatment: Rehabilitation  SUBJECTIVE:   SUBJECTIVE STATEMENT: I really struggled to reach into my cabinets over the weekend. Pt accompanied by: self  PERTINENT HISTORY: Pt had a fall in June 2025, where she tore her L RC. She is now s/p Massive RCR. 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  PRECAUTIONS: Shoulder  WEIGHT BEARING RESTRICTIONS: Yes 1-2#  PAIN:  Are you having pain? Yes: NPRS scale: 1/10 Pain location: Anterior shoulder girdle Pain description: aching Aggravating factors: moving Relieving factors: medication  FALLS: Has patient fallen in last 6 months? Yes. Number of falls 1  PLOF: Independent  PATIENT GOALS: To get all independence back  NEXT MD VISIT: 05/23/24  OBJECTIVE:   HAND DOMINANCE: Left  ADLs: Overall ADLs: Pt requiring max assist with all dressing and bathing. Unable to cook or clean at this time.   FUNCTIONAL OUTCOME  MEASURES: Upper Extremity Functional Scale (UEFS): 12/80   Extreme difficulty/unable (0), Quite a bit of difficulty (1), Moderate difficulty (2), Little difficulty (3), No difficulty (4) Survey date:  04/17/24 06/13/24  Any of your usual work, household or school activities 1 2  2. Your usual hobbies, recreational/sport activities 1 2   3. Lifting a bag of groceries to waist level 1 2   4. Lifting a bag of groceries above your head 0 0  5. Grooming your hair 0 1  6. Pushing up on your hands (I.e. from  bathtub or chair) 1 3  7. Preparing food (I.e. peeling/cutting) 0 3  8. Driving  2 4  9. Vacuuming, sweeping, or raking 0 2  10. Dressing  1 3  11. Doing up buttons 2 4  12. Using tools/appliances 1 2  13. Opening doors 1 3  14. Cleaning  0 2  15. Tying or lacing shoes 0 4  16. Sleeping  1 3  17. Laundering clothes (I.e. washing, ironing, folding) 0 2  18. Opening a jar 0 1  19. Throwing a ball 0 1  20. Carrying a small suitcase with your affected limb.  0 1  Score total:  12/80 45/80     UPPER EXTREMITY ROM:       Assessed in supine, er/IR adducted  Passive ROM Left eval Left 06/13/24  Shoulder flexion 91 133  Shoulder abduction 95 128  Shoulder internal rotation 90 90  Shoulder external rotation 27 58  (Blank rows = not tested)  Assessed in seated, er/IR adducted  Active ROM Left eval Left 06/13/24  Shoulder flexion  107  Shoulder abduction  93  Shoulder internal rotation  90  Shoulder external rotation  52  (Blank rows = not tested)    UPPER EXTREMITY MMT:     Assessed in seated, er/IR adducted  MMT Left eval Left 06/13/24  Shoulder flexion  3+/5  Shoulder abduction  3/5  Shoulder internal rotation  4/5  Shoulder external rotation  3+/5  (Blank rows = not tested)  HAND FUNCTION: Grip strength: Left: 14 lbs  SENSATION: WFL  EDEMA: No swelling noted  OBSERVATIONS: Pt has moderate to severe fascial restictions along her bicep, scapular region, and trapezius.    TODAY'S TREATMENT:                                                                                                                              DATE:   06/19/24 -Manual therapy: myofascial release and trigger point applied to biceps, scapular region, and trapezius in order to reduce fascial restrictions and pain, as well as improve ROM -A/ROM: supine - 1# dumbbell, flexion, abduction, horizontal abduction, er/IR, x12 -A/ROM: seated - flexion, abduction, horizontal abduction, er/IR,  x10 -Stretches: flexion, doorway stretch, er stretch, 4x15 -UBE: level 1, 2.5' forwards and backwards  06/13/24 -Manual therapy: myofascial release and trigger point applied to biceps, scapular region, and trapezius in order  to reduce fascial restrictions and pain, as well as improve ROM -P/ROM: supine - flexion, abduction, horizontal abduction, er/IR, x6 -A/ROM: supine - 1# dumbbell, flexion, abduction, horizontal abduction, er/IR, x10 -Proximal Shoulder Exercises: paddles, criss cross, circles both directions, x10 each -A/ROM: seated - flexion, abduction, horizontal abduction, er/IR, x10  06/09/24 -Manual therapy: myofascial release and trigger point applied to biceps, scapular region, and trapezius in order to reduce fascial restrictions and pain, as well as improve ROM -P/ROM: supine - flexion, abduction, horizontal abduction, er/IR, x6 -A/ROM: supine - flexion, abduction, horizontal abduction, er/IR, x10 -Scapular Strengthening: red, extension, retraction, rows, x15 -Shoulder strengthening: red, horizontal abduction, er, IR, x15  06/06/24 -Manual therapy: myofascial release and trigger point applied to biceps, scapular region, and trapezius in order to reduce fascial restrictions and pain, as well as improve ROM -P/ROM: supine - flexion, abduction, horizontal abduction, er/IR, x6 -A/ROM: supine - flexion, abduction, horizontal abduction, er/IR, x10 -Scapular Strengthening: red, extension, retraction, rows, x15   PATIENT EDUCATION: Education details: Stretching Person educated: Patient Education method: Explanation, Demonstration, and Handouts Education comprehension: verbalized understanding and returned demonstration  HOME EXERCISE PROGRAM: 10/27: Wrist ROM, table slides, pendulums 12/5: A/ROM 12/29: Stretching  GOALS: Goals reviewed with patient? Yes   SHORT TERM GOALS: Target date: 05/18/24  Pt will be provided with and educated on HEP to improve mobility in LUE  required for use during ADL completion.   Goal status: IN PROGRESS  2.  Pt will increase LUE P/ROM by 40 degrees to improve ability to use LUE during dressing tasks with minimal compensatory techniques.   Goal status: IN PROGRESS  3.  Pt will increase LUE strength to 3+/5 to improve ability to reach for items at waist to chest height during bathing and grooming tasks.   Goal status: IN PROGRESS  4. Pt will increase LUE grip strength by 20lbs in order to grasp and hold items needed for cooking and cleaning.    Goal status: IN PROGRESS   LONG TERM GOALS: Target date: 06/17/24  Pt will decrease pain in LUE to 3/10 or less to improve ability to sleep for 2+ consecutive hours without waking due to pain.   Goal status: IN PROGRESS  2.  Pt will decrease LUE fascial restrictions to min amounts or less to improve mobility required for functional reaching tasks.   Goal status: IN PROGRESS  3.  Pt will increase LUE A/ROM by 50 degrees to improve ability to use LUE when reaching overhead or behind back during dressing and bathing tasks.   Goal status: IN PROGRESS  4.  Pt will increase LUE strength to 4+/5 or greater to improve ability to use LUE when lifting or carrying items during meal preparation/housework/yardwork tasks.   Goal status: IN PROGRESS  5.  Pt will return to highest level of function using LUE as dominant/non-dominant during functional task completion.   Goal status: IN PROGRESS   ASSESSMENT:  CLINICAL IMPRESSION: Pt continues to have minimal pain, except in her end ranges. She is able to achieve 75% of full ROM in supine, however once in sitting/standing, she is only able to achieve approximately 60% of full ROM with pain when trying to push it further. OT provided shoulder stretches this session to address stiffness, however due to pain in the end ranges, stretches are modified to pain level. Verbal and tactile cuing provided for positioning and technique.    PERFORMANCE DEFICITS: in functional skills including in functional skills including ADLs, IADLs, coordination, tone, ROM, strength,  pain, fascial restrictions, muscle spasms, and UE functional use.   PLAN:  OT FREQUENCY: 2x/week  OT DURATION: 8 weeks  PLANNED INTERVENTIONS: 97168 OT Re-evaluation, 97535 self care/ADL training, 02889 therapeutic exercise, 97530 therapeutic activity, 97112 neuromuscular re-education, 97140 manual therapy, 97035 ultrasound, 97010 moist heat, 97032 electrical stimulation (manual), passive range of motion, functional mobility training, energy conservation, and coping strategies training  RECOMMENDED OTHER SERVICES: N/A  CONSULTED AND AGREED WITH PLAN OF CARE: Patient  PLAN FOR NEXT SESSION: Manual Therapy, P/ROM, provide theraputty   Valentin Nightingale, OTR/L [facility] [phone] [place], OT 06/19/2024, 9:07 AM    Managed Medicaid Authorization Request Treatment Start Date: 07-11-24  Visit Dx Codes: M25.511, M25.611, R29.898  Functional Tool Score: UEFS: 45/80  For all possible CPT codes, reference the Planned Interventions line above.     Check all conditions that are expected to impact treatment: {Conditions expected to impact treatment:None of these apply   If treatment provided at initial evaluation, no treatment charged due to lack of authorization.     "

## 2024-06-20 ENCOUNTER — Encounter: Payer: Self-pay | Admitting: Orthopedic Surgery

## 2024-06-20 ENCOUNTER — Ambulatory Visit (INDEPENDENT_AMBULATORY_CARE_PROVIDER_SITE_OTHER): Admitting: Orthopedic Surgery

## 2024-06-20 DIAGNOSIS — S46012D Strain of muscle(s) and tendon(s) of the rotator cuff of left shoulder, subsequent encounter: Secondary | ICD-10-CM

## 2024-06-20 NOTE — Progress Notes (Signed)
 Orthopaedic Postop Note  Assessment: Carrie  Mitchell is a 53 y.o. female s/p left shoulder rotator cuff repair for massive tear  DOS: 03/06/24  Plan: [name] has had considerable improvement since I last saw her.  Her active range of motion is much better.  She states her therapist is very pleased with her improvements recently.  We discussed the possibility of a high-volume steroid injection.  She would like to avoid an injection.  She will continue to work with therapy, and I will see her back in 6 weeks.  Follow-up: Return in about 6 weeks (around 08/01/2024). XR at next visit: None  Subjective:  Chief Complaint  Patient presents with   Routine Post Op    L RCR DOS 03/06/24    History of Present Illness: Carrie  Mitchell is a 53 y.o. female who presents following the above stated procedure.  Surgery was approximately 3-4 months ago.  She is feeling better.  She has been working with therapy, twice a week, as well as exercises on her own.  She states her therapist is pleased with her improvements.  Overall, her improvements are noticeable.  She feels better.  Review of Systems: No fevers or chills No numbness or tingling No Chest Pain No shortness of breath Itching   Objective: LMP 06/29/2013   Physical Exam:  Alert and oriented.  No acute distress  Surgical incisions have healed.  No surrounding erythema or drainage.   110 degrees of forward flexion.  90 degrees of abduction.  External rotation of 35 degrees at her side.  IMAGING: I personally ordered and reviewed the following images:  No new imaging obtained today.  Carrie DELENA Horde, MD 06/20/2024 9:06 AM

## 2024-06-23 ENCOUNTER — Ambulatory Visit (HOSPITAL_COMMUNITY): Payer: Self-pay | Admitting: Occupational Therapy

## 2024-06-26 ENCOUNTER — Encounter (HOSPITAL_COMMUNITY): Payer: Self-pay

## 2024-06-26 ENCOUNTER — Ambulatory Visit (HOSPITAL_COMMUNITY): Payer: Self-pay | Admitting: Occupational Therapy

## 2024-06-29 ENCOUNTER — Ambulatory Visit (HOSPITAL_COMMUNITY): Payer: Self-pay | Attending: Orthopedic Surgery | Admitting: Occupational Therapy

## 2024-06-29 ENCOUNTER — Encounter (HOSPITAL_COMMUNITY): Payer: Self-pay | Admitting: Occupational Therapy

## 2024-06-29 DIAGNOSIS — M25611 Stiffness of right shoulder, not elsewhere classified: Secondary | ICD-10-CM | POA: Insufficient documentation

## 2024-06-29 DIAGNOSIS — M25511 Pain in right shoulder: Secondary | ICD-10-CM | POA: Insufficient documentation

## 2024-06-29 DIAGNOSIS — R29898 Other symptoms and signs involving the musculoskeletal system: Secondary | ICD-10-CM | POA: Insufficient documentation

## 2024-06-29 NOTE — Therapy (Signed)
 " OUTPATIENT OCCUPATIONAL THERAPY ORTHO TREATMENT NOTE  Patient Name: Carrie Mitchell MRN: [id number] DOB:Jun 24, 1970, 54 y.o., female Today's Date: 06/29/2024   END OF SESSION:  OT End of Session - 06/29/24 1027     Visit Number 12    Number of Visits 16    Date for Recertification  07/21/24    Authorization Type Self Pay - $150    OT Start Time 0825    OT Stop Time 0908    OT Time Calculation (min) 43 min    Activity Tolerance Patient tolerated treatment well    Behavior During Therapy [facility] for tasks assessed/performed          Past Medical History:  Diagnosis Date   Cellulitis    Cholelithiasis 03/18/2013   Dermatitis 02/16/2012   Diabetes mellitus without complication (HCC)    High cholesterol    Hypertension    Obesity    Prediabetes    Vitamin D  deficiency    Past Surgical History:  Procedure Laterality Date   ARTHOSCOPIC ROTAOR CUFF REPAIR Left 03/06/2024   Procedure: REPAIR, ROTATOR CUFF, ARTHROSCOPIC;  Surgeon: Onesimo Oneil LABOR, MD;  Location: [facility];  Service: Orthopedics;  Laterality: Left;   BREAST SURGERY Right    cyst removal benign   CHOLECYSTECTOMY N/A 03/20/2013   Procedure: LAPAROSCOPIC CHOLECYSTECTOMY;  Surgeon: Thresa JAYSON Pulling, MD;  Location: [facility];  Service: General;  Laterality: N/A;   TUBAL LIGATION     Patient Active Problem List   Diagnosis Date Noted   Pain in left arm 12/14/2023   Hypertension 05/13/2023   Dysuria 04/17/2023   Chronic right shoulder pain 04/17/2023   Elevated BP without diagnosis of hypertension 05/30/2022   Cellulitis 01/30/2014   Urinary incontinence, urge 07/20/2013   Acute renal insufficiency 03/18/2013   Cholelithiasis 03/18/2013   Abdominal pain, right upper quadrant 03/18/2013   Cellulitis of leg, right 03/18/2013   Chest pain 03/17/2013   Nausea & vomiting 03/17/2013   Dermatitis 02/16/2012   Hypokalemia 02/14/2012   Obesity 11/09/2011   Degenerative joint disease of right knee 04/25/2009    PCP: Shona Norleen PEDLAR, MD REFERRING PROVIDER: Onesimo Oneil, MD  ONSET DATE: 03/06/24  REFERRING DIAG:  D53.987I (ICD-10-CM) - Traumatic complete tear of left rotator cuff, subsequent encounter    THERAPY DIAG:  Shoulder stiffness, right  Acute pain of right shoulder  Other symptoms and signs involving the musculoskeletal system  Rationale for Evaluation and Treatment: Rehabilitation  SUBJECTIVE:   SUBJECTIVE STATEMENT: It popped this weekend and about took me out it hurt so bad Pt accompanied by: self  PERTINENT HISTORY: Pt had a fall in June 2025, where she tore her L RC. She is now s/p Massive RCR. 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  PRECAUTIONS: Shoulder  WEIGHT BEARING RESTRICTIONS: Yes 10#  PAIN:  Are you having pain? Yes: NPRS scale: 1/10 Pain location: Anterior shoulder girdle Pain description: aching Aggravating factors: moving Relieving factors: medication  FALLS: Has patient fallen in last 6 months? Yes. Number of falls 1  PLOF: Independent  PATIENT GOALS: To get all independence back  NEXT MD VISIT: 08/01/24  OBJECTIVE:   HAND DOMINANCE: Left  ADLs: Overall ADLs: Pt requiring max assist with all dressing and bathing. Unable to cook or clean at this time.   FUNCTIONAL OUTCOME MEASURES: Upper Extremity Functional Scale (UEFS): 12/80   Extreme difficulty/unable (0), Quite a bit of difficulty (1), Moderate difficulty (2), Little difficulty (3), No  difficulty (4) Survey date:  04/17/24 06/13/24  Any of your usual work, household or school activities 1 2  2. Your usual hobbies, recreational/sport activities 1 2   3. Lifting a bag of groceries to waist level 1 2   4. Lifting a bag of groceries above your head 0 0  5. Grooming your hair 0 1  6. Pushing up on your hands (I.e. from bathtub or chair) 1 3  7. Preparing food (I.e. peeling/cutting) 0 3  8. Driving  2 4  9. Vacuuming, sweeping, or raking 0 2  10. Dressing  1 3  11. Doing up  buttons 2 4  12. Using tools/appliances 1 2  13. Opening doors 1 3  14. Cleaning  0 2  15. Tying or lacing shoes 0 4  16. Sleeping  1 3  17. Laundering clothes (I.e. washing, ironing, folding) 0 2  18. Opening a jar 0 1  19. Throwing a ball 0 1  20. Carrying a small suitcase with your affected limb.  0 1  Score total:  12/80 45/80     UPPER EXTREMITY ROM:       Assessed in supine, er/IR adducted  Passive ROM Left eval Left 06/13/24  Shoulder flexion 91 133  Shoulder abduction 95 128  Shoulder internal rotation 90 90  Shoulder external rotation 27 58  (Blank rows = not tested)  Assessed in seated, er/IR adducted  Active ROM Left eval Left 06/13/24  Shoulder flexion  107  Shoulder abduction  93  Shoulder internal rotation  90  Shoulder external rotation  52  (Blank rows = not tested)    UPPER EXTREMITY MMT:     Assessed in seated, er/IR adducted  MMT Left eval Left 06/13/24  Shoulder flexion  3+/5  Shoulder abduction  3/5  Shoulder internal rotation  4/5  Shoulder external rotation  3+/5  (Blank rows = not tested)  HAND FUNCTION: Grip strength: Left: 14 lbs  SENSATION: WFL  EDEMA: No swelling noted  OBSERVATIONS: Pt has moderate to severe fascial restictions along her bicep, scapular region, and trapezius.    TODAY'S TREATMENT:                                                                                                                              DATE:   06/29/24 -Manual therapy: myofascial release and trigger point applied to biceps, scapular region, and trapezius in order to reduce fascial restrictions and pain, as well as improve ROM -A/ROM: supine - flexion, abduction, horizontal abduction, er/IR, x12 -PNF Strengthening: red band, chest pulls, overhead pulls, er pulls, PNF up, PNF down, x12 -A/ROM: seated - flexion, abduction, horizontal abduction, er/IR, x12  06/19/24 -Manual therapy: myofascial release and trigger point applied to biceps,  scapular region, and trapezius in order to reduce fascial restrictions and pain, as well as improve ROM -A/ROM: supine - 1# dumbbell, flexion, abduction, horizontal abduction, er/IR, x12 -A/ROM: seated - flexion,  abduction, horizontal abduction, er/IR, x10 -Stretches: flexion, doorway stretch, er stretch, 4x15 -UBE: level 1, 2.5' forwards and backwards  06/13/24 -Manual therapy: myofascial release and trigger point applied to biceps, scapular region, and trapezius in order to reduce fascial restrictions and pain, as well as improve ROM -P/ROM: supine - flexion, abduction, horizontal abduction, er/IR, x6 -A/ROM: supine - 1# dumbbell, flexion, abduction, horizontal abduction, er/IR, x10 -Proximal Shoulder Exercises: paddles, criss cross, circles both directions, x10 each -A/ROM: seated - flexion, abduction, horizontal abduction, er/IR, x10   PATIENT EDUCATION: Education details: PNF Strengthening Person educated: Patient Education method: Programmer, Multimedia, Demonstration, and Handouts Education comprehension: verbalized understanding and returned demonstration  HOME EXERCISE PROGRAM: 10/27: Wrist ROM, table slides, pendulums 12/5: A/ROM 12/29: Stretching 1/8: PNF Strengthening  GOALS: Goals reviewed with patient? Yes   SHORT TERM GOALS: Target date: 05/18/24  Pt will be provided with and educated on HEP to improve mobility in LUE required for use during ADL completion.   Goal status: IN PROGRESS  2.  Pt will increase LUE P/ROM by 40 degrees to improve ability to use LUE during dressing tasks with minimal compensatory techniques.   Goal status: IN PROGRESS  3.  Pt will increase LUE strength to 3+/5 to improve ability to reach for items at waist to chest height during bathing and grooming tasks.   Goal status: IN PROGRESS  4. Pt will increase LUE grip strength by 20lbs in order to grasp and hold items needed for cooking and cleaning.    Goal status: IN PROGRESS   LONG TERM  GOALS: Target date: 06/17/24  Pt will decrease pain in LUE to 3/10 or less to improve ability to sleep for 2+ consecutive hours without waking due to pain.   Goal status: IN PROGRESS  2.  Pt will decrease LUE fascial restrictions to min amounts or less to improve mobility required for functional reaching tasks.   Goal status: IN PROGRESS  3.  Pt will increase LUE A/ROM by 50 degrees to improve ability to use LUE when reaching overhead or behind back during dressing and bathing tasks.   Goal status: IN PROGRESS  4.  Pt will increase LUE strength to 4+/5 or greater to improve ability to use LUE when lifting or carrying items during meal preparation/housework/yardwork tasks.   Goal status: IN PROGRESS  5.  Pt will return to highest level of function using LUE as dominant/non-dominant during functional task completion.   Goal status: IN PROGRESS   ASSESSMENT:  CLINICAL IMPRESSION: Pt reports increased pain over the weekend after she felt a pop, however that has since cleared up. This session she is demonstrating improved ROM, however strength continues to be weak. OT adding more resistance band exercises to continue addressing functional strength. Verbal and tactile cuing provided for positioning and technique.   PERFORMANCE DEFICITS: in functional skills including in functional skills including ADLs, IADLs, coordination, tone, ROM, strength, pain, fascial restrictions, muscle spasms, and UE functional use.   PLAN:  OT FREQUENCY: 2x/week  OT DURATION: 8 weeks  PLANNED INTERVENTIONS: 97168 OT Re-evaluation, 97535 self care/ADL training, 02889 therapeutic exercise, 97530 therapeutic activity, 97112 neuromuscular re-education, 97140 manual therapy, 97035 ultrasound, 97010 moist heat, 97032 electrical stimulation (manual), passive range of motion, functional mobility training, energy conservation, and coping strategies training  RECOMMENDED OTHER SERVICES: N/A  CONSULTED AND AGREED  WITH PLAN OF CARE: Patient  PLAN FOR NEXT SESSION: Manual Therapy, P/ROM, provide theraputty   Valentin Nightingale, OTR/L [facility] [phone] Carrie Mitchell  [place], OT 06/29/2024, 10:28 AM    Managed Medicaid Authorization Request Treatment Start Date: July 10, 2024  Visit Dx Codes: M25.511, M25.611, R29.898  Functional Tool Score: UEFS: 45/80  For all possible CPT codes, reference the Planned Interventions line above.     Check all conditions that are expected to impact treatment: {Conditions expected to impact treatment:None of these apply   If treatment provided at initial evaluation, no treatment charged due to lack of authorization.     "

## 2024-06-29 NOTE — Patient Instructions (Signed)

## 2024-07-03 ENCOUNTER — Ambulatory Visit (HOSPITAL_COMMUNITY): Admitting: Occupational Therapy

## 2024-07-06 ENCOUNTER — Ambulatory Visit (HOSPITAL_COMMUNITY): Payer: Self-pay | Admitting: Occupational Therapy

## 2024-07-06 ENCOUNTER — Encounter (HOSPITAL_COMMUNITY): Payer: Self-pay | Admitting: Occupational Therapy

## 2024-07-06 DIAGNOSIS — M25511 Pain in right shoulder: Secondary | ICD-10-CM

## 2024-07-06 DIAGNOSIS — R29898 Other symptoms and signs involving the musculoskeletal system: Secondary | ICD-10-CM

## 2024-07-06 DIAGNOSIS — M25611 Stiffness of right shoulder, not elsewhere classified: Secondary | ICD-10-CM

## 2024-07-06 NOTE — Therapy (Signed)
 " OUTPATIENT OCCUPATIONAL THERAPY ORTHO TREATMENT NOTE  Patient Name: Carrie Mitchell MRN: [id number] DOB:[birth year], 54 y.o., female Today's Date: 07/06/2024   END OF SESSION:  OT End of Session - 07/06/24 0905     Visit Number 13    Number of Visits 16    Date for Recertification  07/21/24    Authorization Type Self Pay - $150    OT Start Time 0820    OT Stop Time 0902    OT Time Calculation (min) 42 min    Activity Tolerance Patient tolerated treatment well    Behavior During Therapy [facility] for tasks assessed/performed          Past Medical History:  Diagnosis Date   Cellulitis    Cholelithiasis 03/18/2013   Dermatitis 02/16/2012   Diabetes mellitus without complication (HCC)    High cholesterol    Hypertension    Obesity    Prediabetes    Vitamin D  deficiency    Past Surgical History:  Procedure Laterality Date   ARTHOSCOPIC ROTAOR CUFF REPAIR Left 03/06/2024   Procedure: REPAIR, ROTATOR CUFF, ARTHROSCOPIC;  Surgeon: Onesimo Oneil LABOR, MD;  Location: [facility];  Service: Orthopedics;  Laterality: Left;   BREAST SURGERY Right    cyst removal benign   CHOLECYSTECTOMY N/A 03/20/2013   Procedure: LAPAROSCOPIC CHOLECYSTECTOMY;  Surgeon: Thresa JAYSON Pulling, MD;  Location: [facility];  Service: General;  Laterality: N/A;   TUBAL LIGATION     Patient Active Problem List   Diagnosis Date Noted   Pain in left arm 12/14/2023   Hypertension 05/13/2023   Dysuria 04/17/2023   Chronic right shoulder pain 04/17/2023   Elevated BP without diagnosis of hypertension 05/30/2022   Cellulitis 01/30/2014   Urinary incontinence, urge 07/20/2013   Acute renal insufficiency 03/18/2013   Cholelithiasis 03/18/2013   Abdominal pain, right upper quadrant 03/18/2013   Cellulitis of leg, right 03/18/2013   Chest pain 03/17/2013   Nausea & vomiting 03/17/2013   Dermatitis 02/16/2012   Hypokalemia 02/14/2012   Obesity 11/09/2011   Degenerative joint disease of right knee 04/25/2009    PCP: Shona Norleen PEDLAR, MD REFERRING PROVIDER: Onesimo Oneil, MD  ONSET DATE: 03/06/24  REFERRING DIAG:  D53.987I (ICD-10-CM) - Traumatic complete tear of left rotator cuff, subsequent encounter    THERAPY DIAG:  Shoulder stiffness, right  Acute pain of right shoulder  Other symptoms and signs involving the musculoskeletal system  Rationale for Evaluation and Treatment: Rehabilitation  SUBJECTIVE:   SUBJECTIVE STATEMENT: It popped this weekend and about took me out it hurt so bad Pt accompanied by: self  PERTINENT HISTORY: Pt had a fall in June 2025, where she tore her L RC. She is now s/p Massive RCR. 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  PRECAUTIONS: Shoulder  WEIGHT BEARING RESTRICTIONS: Yes 10#  PAIN:  Are you having pain? Yes: NPRS scale: 1/10 Pain location: Anterior shoulder girdle Pain description: aching Aggravating factors: moving Relieving factors: medication  FALLS: Has patient fallen in last 6 months? Yes. Number of falls 1  PLOF: Independent  PATIENT GOALS: To get all independence back  NEXT MD VISIT: 08/01/24  OBJECTIVE:   HAND DOMINANCE: Left  ADLs: Overall ADLs: Pt requiring max assist with all dressing and bathing. Unable to cook or clean at this time.   FUNCTIONAL OUTCOME MEASURES: Upper Extremity Functional Scale (UEFS): 12/80   Extreme difficulty/unable (0), Quite a bit of difficulty (1), Moderate difficulty (2), Little difficulty (3), No  difficulty (4) Survey date:  04/17/24 06/13/24  Any of your usual work, household or school activities 1 2  2. Your usual hobbies, recreational/sport activities 1 2   3. Lifting a bag of groceries to waist level 1 2   4. Lifting a bag of groceries above your head 0 0  5. Grooming your hair 0 1  6. Pushing up on your hands (I.e. from bathtub or chair) 1 3  7. Preparing food (I.e. peeling/cutting) 0 3  8. Driving  2 4  9. Vacuuming, sweeping, or raking 0 2  10. Dressing  1 3  11. Doing up  buttons 2 4  12. Using tools/appliances 1 2  13. Opening doors 1 3  14. Cleaning  0 2  15. Tying or lacing shoes 0 4  16. Sleeping  1 3  17. Laundering clothes (I.e. washing, ironing, folding) 0 2  18. Opening a jar 0 1  19. Throwing a ball 0 1  20. Carrying a small suitcase with your affected limb.  0 1  Score total:  12/80 45/80     UPPER EXTREMITY ROM:       Assessed in supine, er/IR adducted  Passive ROM Left eval Left 06/13/24  Shoulder flexion 91 133  Shoulder abduction 95 128  Shoulder internal rotation 90 90  Shoulder external rotation 27 58  (Blank rows = not tested)  Assessed in seated, er/IR adducted  Active ROM Left eval Left 06/13/24  Shoulder flexion  107  Shoulder abduction  93  Shoulder internal rotation  90  Shoulder external rotation  52  (Blank rows = not tested)    UPPER EXTREMITY MMT:     Assessed in seated, er/IR adducted  MMT Left eval Left 06/13/24  Shoulder flexion  3+/5  Shoulder abduction  3/5  Shoulder internal rotation  4/5  Shoulder external rotation  3+/5  (Blank rows = not tested)  HAND FUNCTION: Grip strength: Left: 14 lbs  SENSATION: WFL  EDEMA: No swelling noted  OBSERVATIONS: Pt has moderate to severe fascial restictions along her bicep, scapular region, and trapezius.    TODAY'S TREATMENT:                                                                                                                              DATE:   07/06/24 -Manual therapy: myofascial release and trigger point applied to biceps, scapular region, and trapezius in order to reduce fascial restrictions and pain, as well as improve ROM -A/ROM: supine - 1#, flexion, abduction, horizontal abduction, er/IR, x12 -Overhead lacing -functional reaching: cones from counter to 1st cabinet to 2nd counter in flexion, down in abduction -A/ROM: seated - flexion, abduction, horizontal abduction, er/IR, x12  06/29/24 -Manual therapy: myofascial release and  trigger point applied to biceps, scapular region, and trapezius in order to reduce fascial restrictions and pain, as well as improve ROM -A/ROM: supine - flexion, abduction, horizontal abduction, er/IR, x12 -PNF Strengthening:  red band, chest pulls, overhead pulls, er pulls, PNF up, PNF down, x12 -A/ROM: seated - flexion, abduction, horizontal abduction, er/IR, x12  06/19/24 -Manual therapy: myofascial release and trigger point applied to biceps, scapular region, and trapezius in order to reduce fascial restrictions and pain, as well as improve ROM -A/ROM: supine - 1# dumbbell, flexion, abduction, horizontal abduction, er/IR, x12 -A/ROM: seated - flexion, abduction, horizontal abduction, er/IR, x10 -Stretches: flexion, doorway stretch, er stretch, 4x15 -UBE: level 1, 2.5' forwards and backwards   PATIENT EDUCATION: Education details: Continue HEP Person educated: Patient Education method: Explanation, Demonstration, and Handouts Education comprehension: verbalized understanding and returned demonstration  HOME EXERCISE PROGRAM: 10/27: Wrist ROM, table slides, pendulums 12/5: A/ROM 12/29: Stretching 1/8: PNF Strengthening  GOALS: Goals reviewed with patient? Yes   SHORT TERM GOALS: Target date: 05/18/24  Pt will be provided with and educated on HEP to improve mobility in LUE required for use during ADL completion.   Goal status: IN PROGRESS  2.  Pt will increase LUE P/ROM by 40 degrees to improve ability to use LUE during dressing tasks with minimal compensatory techniques.   Goal status: IN PROGRESS  3.  Pt will increase LUE strength to 3+/5 to improve ability to reach for items at waist to chest height during bathing and grooming tasks.   Goal status: IN PROGRESS  4. Pt will increase LUE grip strength by 20lbs in order to grasp and hold items needed for cooking and cleaning.    Goal status: IN PROGRESS   LONG TERM GOALS: Target date: 06/17/24  Pt will decrease pain  in LUE to 3/10 or less to improve ability to sleep for 2+ consecutive hours without waking due to pain.   Goal status: IN PROGRESS  2.  Pt will decrease LUE fascial restrictions to min amounts or less to improve mobility required for functional reaching tasks.   Goal status: IN PROGRESS  3.  Pt will increase LUE A/ROM by 50 degrees to improve ability to use LUE when reaching overhead or behind back during dressing and bathing tasks.   Goal status: IN PROGRESS  4.  Pt will increase LUE strength to 4+/5 or greater to improve ability to use LUE when lifting or carrying items during meal preparation/housework/yardwork tasks.   Goal status: IN PROGRESS  5.  Pt will return to highest level of function using LUE as dominant/non-dominant during functional task completion.   Goal status: IN PROGRESS   ASSESSMENT:  CLINICAL IMPRESSION: This session pt demonstrating continued limited ROM and strength. She reports good follow through with her HEP and she is trying to do more functional things at home as well. Overall pt continues to have difficulty with dressing, pulling her hair up, and getting things out of cabinets and closets. She was able to reach to the 1st and 2nd shelf this session during the reaching exercise, which was moderately difficult. OT providing verbal and tactile cuing for positioning and technique throughout session.   PERFORMANCE DEFICITS: in functional skills including in functional skills including ADLs, IADLs, coordination, tone, ROM, strength, pain, fascial restrictions, muscle spasms, and UE functional use.   PLAN:  OT FREQUENCY: 2x/week  OT DURATION: 8 weeks  PLANNED INTERVENTIONS: 97168 OT Re-evaluation, 97535 self care/ADL training, 02889 therapeutic exercise, 97530 therapeutic activity, 97112 neuromuscular re-education, 97140 manual therapy, 97035 ultrasound, 97010 moist heat, 97032 electrical stimulation (manual), passive range of motion, functional mobility  training, energy conservation, and coping strategies training  RECOMMENDED OTHER SERVICES: N/A  CONSULTED  AND AGREED WITH PLAN OF CARE: Patient  PLAN FOR NEXT SESSION: Manual Therapy, P/ROM, provide theraputty   Valentin Nightingale, OTR/L [facility] [phone] Learned, OT 07/06/2024, 9:07 AM    Managed Medicaid Authorization Request Treatment Start Date: 06-25-24  Visit Dx Codes: M25.511, M25.611, R29.898  Functional Tool Score: UEFS: 45/80  For all possible CPT codes, reference the Planned Interventions line above.     Check all conditions that are expected to impact treatment: {Conditions expected to impact treatment:None of these apply   If treatment provided at initial evaluation, no treatment charged due to lack of authorization.     "

## 2024-07-10 ENCOUNTER — Ambulatory Visit (HOSPITAL_COMMUNITY): Admitting: Occupational Therapy

## 2024-07-13 ENCOUNTER — Ambulatory Visit (HOSPITAL_COMMUNITY): Payer: Self-pay | Admitting: Occupational Therapy

## 2024-07-13 ENCOUNTER — Encounter (HOSPITAL_COMMUNITY): Payer: Self-pay | Admitting: Occupational Therapy

## 2024-07-13 DIAGNOSIS — M25511 Pain in right shoulder: Secondary | ICD-10-CM

## 2024-07-13 DIAGNOSIS — M25611 Stiffness of right shoulder, not elsewhere classified: Secondary | ICD-10-CM

## 2024-07-13 DIAGNOSIS — R29898 Other symptoms and signs involving the musculoskeletal system: Secondary | ICD-10-CM

## 2024-07-13 NOTE — Therapy (Signed)
 " OUTPATIENT OCCUPATIONAL THERAPY ORTHO TREATMENT NOTE  Patient Name: Carrie  Mitchell MRN: [id number] DOB:1970-11-06, 54 y.o., female Today's Date: 07/13/2024   END OF SESSION:  OT End of Session - 07/13/24 1841     Visit Number 14    Number of Visits 16    Date for Recertification  07/21/24    Authorization Type Self Pay - $150    Authorization - Visit Number 2    Authorization - Number of Visits 4    OT Start Time 0818    OT Stop Time 0903    OT Time Calculation (min) 45 min    Activity Tolerance Patient tolerated treatment well    Behavior During Therapy [facility] for tasks assessed/performed           Past Medical History:  Diagnosis Date   Cellulitis    Cholelithiasis 03/18/2013   Dermatitis 02/16/2012   Diabetes mellitus without complication (HCC)    High cholesterol    Hypertension    Obesity    Prediabetes    Vitamin D  deficiency    Past Surgical History:  Procedure Laterality Date   ARTHOSCOPIC ROTAOR CUFF REPAIR Left 03/06/2024   Procedure: REPAIR, ROTATOR CUFF, ARTHROSCOPIC;  Surgeon: Onesimo Oneil LABOR, MD;  Location: [facility];  Service: Orthopedics;  Laterality: Left;   BREAST SURGERY Right    cyst removal benign   CHOLECYSTECTOMY N/A 03/20/2013   Procedure: LAPAROSCOPIC CHOLECYSTECTOMY;  Surgeon: Thresa JAYSON Pulling, MD;  Location: [facility];  Service: General;  Laterality: N/A;   TUBAL LIGATION     Patient Active Problem List   Diagnosis Date Noted   Pain in left arm 12/14/2023   Hypertension 05/13/2023   Dysuria 04/17/2023   Chronic right shoulder pain 04/17/2023   Elevated BP without diagnosis of hypertension 05/30/2022   Cellulitis 01/30/2014   Urinary incontinence, urge 07/20/2013   Acute renal insufficiency 03/18/2013   Cholelithiasis 03/18/2013   Abdominal pain, right upper quadrant 03/18/2013   Cellulitis of leg, right 03/18/2013   Chest pain 03/17/2013   Nausea & vomiting 03/17/2013   Dermatitis 02/16/2012   Hypokalemia 02/14/2012   Obesity  11/09/2011   Degenerative joint disease of right knee 04/25/2009    PCP: Shona Norleen PEDLAR, MD REFERRING PROVIDER: Onesimo Oneil, MD  ONSET DATE: 03/06/24  REFERRING DIAG:  D53.987I (ICD-10-CM) - Traumatic complete tear of left rotator cuff, subsequent encounter    THERAPY DIAG:  Shoulder stiffness, right  Acute pain of right shoulder  Other symptoms and signs involving the musculoskeletal system  Rationale for Evaluation and Treatment: Rehabilitation  SUBJECTIVE:   SUBJECTIVE STATEMENT: It still hurts to reach up Pt accompanied by: self  PERTINENT HISTORY: Pt had a fall in June 2025, where she tore her L RC. She is now s/p Massive RCR. 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  PRECAUTIONS: Shoulder  WEIGHT BEARING RESTRICTIONS: Yes 10#  PAIN:  Are you having pain? Yes: NPRS scale: 1/10 Pain location: Anterior shoulder girdle Pain description: aching Aggravating factors: moving Relieving factors: medication  FALLS: Has patient fallen in last 6 months? Yes. Number of falls 1  PLOF: Independent  PATIENT GOALS: To get all independence back  NEXT MD VISIT: 08/01/24  OBJECTIVE:   HAND DOMINANCE: Left  ADLs: Overall ADLs: Pt requiring max assist with all dressing and bathing. Unable to cook or clean at this time.   FUNCTIONAL OUTCOME MEASURES: Upper Extremity Functional Scale (UEFS): 12/80   Extreme difficulty/unable (0), Quite a  bit of difficulty (1), Moderate difficulty (2), Little difficulty (3), No difficulty (4) Survey date:  04/17/24 06/13/24  Any of your usual work, household or school activities 1 2  2. Your usual hobbies, recreational/sport activities 1 2   3. Lifting a bag of groceries to waist level 1 2   4. Lifting a bag of groceries above your head 0 0  5. Grooming your hair 0 1  6. Pushing up on your hands (I.e. from bathtub or chair) 1 3  7. Preparing food (I.e. peeling/cutting) 0 3  8. Driving  2 4  9. Vacuuming,  sweeping, or raking 0 2  10. Dressing  1 3  11. Doing up buttons 2 4  12. Using tools/appliances 1 2  13. Opening doors 1 3  14. Cleaning  0 2  15. Tying or lacing shoes 0 4  16. Sleeping  1 3  17. Laundering clothes (I.e. washing, ironing, folding) 0 2  18. Opening a jar 0 1  19. Throwing a ball 0 1  20. Carrying a small suitcase with your affected limb.  0 1  Score total:  12/80 45/80     UPPER EXTREMITY ROM:       Assessed in supine, er/IR adducted  Passive ROM Left eval Left 06/13/24  Shoulder flexion 91 133  Shoulder abduction 95 128  Shoulder internal rotation 90 90  Shoulder external rotation 27 58  (Blank rows = not tested)  Assessed in seated, er/IR adducted  Active ROM Left eval Left 06/13/24  Shoulder flexion  107  Shoulder abduction  93  Shoulder internal rotation  90  Shoulder external rotation  52  (Blank rows = not tested)    UPPER EXTREMITY MMT:     Assessed in seated, er/IR adducted  MMT Left eval Left 06/13/24  Shoulder flexion  3+/5  Shoulder abduction  3/5  Shoulder internal rotation  4/5  Shoulder external rotation  3+/5  (Blank rows = not tested)  HAND FUNCTION: Grip strength: Left: 14 lbs  SENSATION: WFL  EDEMA: No swelling noted  OBSERVATIONS: Pt has moderate to severe fascial restictions along her bicep, scapular region, and trapezius.    TODAY'S TREATMENT:                                                                                                                              DATE:   07/13/24 -Manual therapy: myofascial release and trigger point applied to biceps, scapular region, and trapezius in order to reduce fascial restrictions and pain, as well as improve ROM -P/ROM: supine - flexion, abduction, er/IR, x6 -A/ROM: supine - 2#, flexion, abduction, protraction, horizontal abduction, er/IR, x12 -Scapular Strengthening: green, extension, retraction, rows, x15 -Shoulder Strengthening: red band, horizontal abduction,  er, IR, yellow band, abduction, flexion, x15 -UBE: Level 2, 2.5' forwards and backwards  07/06/24 -Manual therapy: myofascial release and trigger point applied to biceps, scapular region, and trapezius in order to reduce fascial  restrictions and pain, as well as improve ROM -A/ROM: supine - 1#, flexion, abduction, horizontal abduction, er/IR, x12 -Overhead lacing -functional reaching: cones from counter to 1st cabinet to 2nd counter in flexion, down in abduction -A/ROM: seated - flexion, abduction, horizontal abduction, er/IR, x12  06/29/24 -Manual therapy: myofascial release and trigger point applied to biceps, scapular region, and trapezius in order to reduce fascial restrictions and pain, as well as improve ROM -A/ROM: supine - flexion, abduction, horizontal abduction, er/IR, x12 -PNF Strengthening: red band, chest pulls, overhead pulls, er pulls, PNF up, PNF down, x12 -A/ROM: seated - flexion, abduction, horizontal abduction, er/IR, x12   PATIENT EDUCATION: Education details: Shoulder strengthening Person educated: Patient Education method: Explanation, Demonstration, and Handouts Education comprehension: verbalized understanding and returned demonstration  HOME EXERCISE PROGRAM: 10/27: Wrist ROM, table slides, pendulums 12/5: A/ROM 12/29: Stretching 1/8: PNF Strengthening 1/22: Shoulder strengthening  GOALS: Goals reviewed with patient? Yes   SHORT TERM GOALS: Target date: 05/18/24  Pt will be provided with and educated on HEP to improve mobility in LUE required for use during ADL completion.   Goal status: IN PROGRESS  2.  Pt will increase LUE P/ROM by 40 degrees to improve ability to use LUE during dressing tasks with minimal compensatory techniques.   Goal status: IN PROGRESS  3.  Pt will increase LUE strength to 3+/5 to improve ability to reach for items at waist to chest height during bathing and grooming tasks.   Goal status: IN PROGRESS  4. Pt will increase  LUE grip strength by 20lbs in order to grasp and hold items needed for cooking and cleaning.    Goal status: IN PROGRESS   LONG TERM GOALS: Target date: 06/17/24  Pt will decrease pain in LUE to 3/10 or less to improve ability to sleep for 2+ consecutive hours without waking due to pain.   Goal status: IN PROGRESS  2.  Pt will decrease LUE fascial restrictions to min amounts or less to improve mobility required for functional reaching tasks.   Goal status: IN PROGRESS  3.  Pt will increase LUE A/ROM by 50 degrees to improve ability to use LUE when reaching overhead or behind back during dressing and bathing tasks.   Goal status: IN PROGRESS  4.  Pt will increase LUE strength to 4+/5 or greater to improve ability to use LUE when lifting or carrying items during meal preparation/housework/yardwork tasks.   Goal status: IN PROGRESS  5.  Pt will return to highest level of function using LUE as dominant/non-dominant during functional task completion.   Goal status: IN PROGRESS   ASSESSMENT:  CLINICAL IMPRESSION: Pt reports continued pain with reaching overhead and lifting most items. She is able to push through some pain, however her ROM is also limiting. In supine, pt is able to achieve 80% of full ROM, once in standing, she is only able to achieve approximately 60% of full range. She fatigued with resistance band exercises, requiring 1 longer break in the middle of those. She did complete with increased weight and resistance this session, while maintaining good form. OT providing verbal cuing for positioning and technique.   PERFORMANCE DEFICITS: in functional skills including in functional skills including ADLs, IADLs, coordination, tone, ROM, strength, pain, fascial restrictions, muscle spasms, and UE functional use.   PLAN:  OT FREQUENCY: 2x/week  OT DURATION: 8 weeks  PLANNED INTERVENTIONS: 97168 OT Re-evaluation, 97535 self care/ADL training, 02889 therapeutic exercise,  97530 therapeutic activity, 97112 neuromuscular re-education, 97140 manual therapy,  02964 ultrasound, 02989 moist heat, 97032 electrical stimulation (manual), passive range of motion, functional mobility training, energy conservation, and coping strategies training  RECOMMENDED OTHER SERVICES: N/A  CONSULTED AND AGREED WITH PLAN OF CARE: Patient  PLAN FOR NEXT SESSION: Manual Therapy, P/ROM, provide theraputty   Valentin Nightingale, OTR/L [facility] [phone], OT 07/13/2024, 6:42 PM    Managed Medicaid Authorization Request Treatment Start Date: 06-28-24  Visit Dx Codes: M25.511, M25.611, R29.898  Functional Tool Score: UEFS: 45/80  For all possible CPT codes, reference the Planned Interventions line above.     Check all conditions that are expected to impact treatment: {Conditions expected to impact treatment:None of these apply   If treatment provided at initial evaluation, no treatment charged due to lack of authorization.     "

## 2024-07-13 NOTE — Patient Instructions (Signed)

## 2024-07-17 ENCOUNTER — Ambulatory Visit (HOSPITAL_COMMUNITY): Admitting: Occupational Therapy

## 2024-07-20 ENCOUNTER — Ambulatory Visit (HOSPITAL_COMMUNITY): Payer: Self-pay | Admitting: Occupational Therapy

## 2024-07-24 ENCOUNTER — Ambulatory Visit (HOSPITAL_COMMUNITY): Admitting: Occupational Therapy

## 2024-07-27 ENCOUNTER — Ambulatory Visit (HOSPITAL_COMMUNITY): Payer: Self-pay | Attending: Orthopedic Surgery | Admitting: Occupational Therapy

## 2024-07-31 ENCOUNTER — Ambulatory Visit (HOSPITAL_COMMUNITY): Admitting: Occupational Therapy

## 2024-08-01 ENCOUNTER — Ambulatory Visit: Admitting: Orthopedic Surgery

## 2024-08-03 ENCOUNTER — Ambulatory Visit (HOSPITAL_COMMUNITY): Payer: Self-pay | Attending: Orthopedic Surgery | Admitting: Occupational Therapy

## 2024-08-08 ENCOUNTER — Ambulatory Visit: Admitting: Orthopedic Surgery
